# Patient Record
Sex: Female | Born: 1955
Health system: Southern US, Community
[De-identification: ages and names within clinical notes are randomized; demographics above are authoritative.]

## PROBLEM LIST (undated history)

## (undated) DIAGNOSIS — F419 Anxiety disorder, unspecified: Secondary | ICD-10-CM

## (undated) DIAGNOSIS — I509 Heart failure, unspecified: Secondary | ICD-10-CM

## (undated) DIAGNOSIS — J189 Pneumonia, unspecified organism: Secondary | ICD-10-CM

## (undated) DIAGNOSIS — R55 Syncope and collapse: Secondary | ICD-10-CM

## (undated) DIAGNOSIS — I219 Acute myocardial infarction, unspecified: Secondary | ICD-10-CM

## (undated) DIAGNOSIS — K219 Gastro-esophageal reflux disease without esophagitis: Secondary | ICD-10-CM

## (undated) DIAGNOSIS — T884XXA Failed or difficult intubation, initial encounter: Secondary | ICD-10-CM

## (undated) DIAGNOSIS — I447 Left bundle-branch block, unspecified: Secondary | ICD-10-CM

## (undated) DIAGNOSIS — I2510 Atherosclerotic heart disease of native coronary artery without angina pectoris: Secondary | ICD-10-CM

## (undated) DIAGNOSIS — K5790 Diverticulosis of intestine, part unspecified, without perforation or abscess without bleeding: Secondary | ICD-10-CM

## (undated) DIAGNOSIS — G43909 Migraine, unspecified, not intractable, without status migrainosus: Secondary | ICD-10-CM

## (undated) DIAGNOSIS — E222 Syndrome of inappropriate secretion of antidiuretic hormone: Secondary | ICD-10-CM

## (undated) DIAGNOSIS — I25119 Atherosclerotic heart disease of native coronary artery with unspecified angina pectoris: Secondary | ICD-10-CM

## (undated) DIAGNOSIS — F32A Depression, unspecified: Secondary | ICD-10-CM

## (undated) DIAGNOSIS — I2489 Other forms of acute ischemic heart disease: Secondary | ICD-10-CM

## (undated) DIAGNOSIS — R0602 Shortness of breath: Secondary | ICD-10-CM

## (undated) DIAGNOSIS — I248 Other forms of acute ischemic heart disease: Secondary | ICD-10-CM

## (undated) DIAGNOSIS — Z79899 Other long term (current) drug therapy: Secondary | ICD-10-CM

## (undated) DIAGNOSIS — I10 Essential (primary) hypertension: Secondary | ICD-10-CM

## (undated) DIAGNOSIS — M1990 Unspecified osteoarthritis, unspecified site: Secondary | ICD-10-CM

## (undated) DIAGNOSIS — I441 Atrioventricular block, second degree: Secondary | ICD-10-CM

## (undated) DIAGNOSIS — I4892 Unspecified atrial flutter: Secondary | ICD-10-CM

## (undated) DIAGNOSIS — J449 Chronic obstructive pulmonary disease, unspecified: Secondary | ICD-10-CM

## (undated) DIAGNOSIS — E785 Hyperlipidemia, unspecified: Secondary | ICD-10-CM

## (undated) DIAGNOSIS — F329 Major depressive disorder, single episode, unspecified: Secondary | ICD-10-CM

## (undated) DIAGNOSIS — J810 Acute pulmonary edema: Secondary | ICD-10-CM

## (undated) HISTORY — PX: DILATION AND CURETTAGE OF UTERUS: SHX78

## (undated) HISTORY — DX: Other long term (current) drug therapy: Z79.899

## (undated) HISTORY — PX: VAGINAL HYSTERECTOMY: SUR661

## (undated) HISTORY — PX: TUBAL LIGATION: SHX77

## (undated) HISTORY — DX: Migraine, unspecified, not intractable, without status migrainosus: G43.909

## (undated) HISTORY — DX: Unspecified osteoarthritis, unspecified site: M19.90

## (undated) HISTORY — DX: Diverticulosis of intestine, part unspecified, without perforation or abscess without bleeding: K57.90

## (undated) HISTORY — DX: Atrioventricular block, second degree: I44.1

## (undated) HISTORY — PX: BREAST SURGERY: SHX581

## (undated) HISTORY — DX: Pneumonia, unspecified organism: J18.9

## (undated) HISTORY — DX: Atherosclerotic heart disease of native coronary artery without angina pectoris: I25.10

## (undated) HISTORY — DX: Essential (primary) hypertension: I10

## (undated) HISTORY — PX: CARPAL TUNNEL RELEASE: SHX101

## (undated) HISTORY — DX: Other forms of acute ischemic heart disease: I24.89

## (undated) HISTORY — DX: Hyperlipidemia, unspecified: E78.5

## (undated) HISTORY — DX: Syndrome of inappropriate secretion of antidiuretic hormone: E22.2

## (undated) HISTORY — DX: Atherosclerotic heart disease of native coronary artery with unspecified angina pectoris: I25.119

## (undated) HISTORY — PX: SHOULDER ARTHROSCOPY WITH ROTATOR CUFF REPAIR: SHX5685

## (undated) HISTORY — PX: INGUINAL HERNIA REPAIR: SUR1180

## (undated) HISTORY — DX: Shortness of breath: R06.02

## (undated) HISTORY — DX: Acute pulmonary edema: J81.0

## (undated) HISTORY — DX: Syncope and collapse: R55

## (undated) HISTORY — DX: Other forms of acute ischemic heart disease: I24.8

## (undated) HISTORY — DX: Unspecified atrial flutter: I48.92

---

## 2004-10-20 HISTORY — PX: TRACHEOSTOMY: SUR1362

## 2006-10-20 HISTORY — PX: CARDIAC CATHETERIZATION: SHX172

## 2015-06-28 DIAGNOSIS — I447 Left bundle-branch block, unspecified: Secondary | ICD-10-CM

## 2015-06-28 DIAGNOSIS — I472 Ventricular tachycardia, unspecified: Secondary | ICD-10-CM

## 2015-06-28 DIAGNOSIS — Z8679 Personal history of other diseases of the circulatory system: Secondary | ICD-10-CM

## 2015-06-28 DIAGNOSIS — E782 Mixed hyperlipidemia: Secondary | ICD-10-CM

## 2015-06-28 DIAGNOSIS — I2510 Atherosclerotic heart disease of native coronary artery without angina pectoris: Secondary | ICD-10-CM

## 2015-06-28 HISTORY — DX: Mixed hyperlipidemia: E78.2

## 2015-06-28 HISTORY — DX: Left bundle-branch block, unspecified: I44.7

## 2015-06-28 HISTORY — DX: Atherosclerotic heart disease of native coronary artery without angina pectoris: I25.10

## 2015-06-28 HISTORY — DX: Ventricular tachycardia, unspecified: I47.20

## 2015-06-28 HISTORY — DX: Ventricular tachycardia: I47.2

## 2015-06-28 HISTORY — DX: Personal history of other diseases of the circulatory system: Z86.79

## 2015-11-12 DIAGNOSIS — G43909 Migraine, unspecified, not intractable, without status migrainosus: Secondary | ICD-10-CM | POA: Diagnosis not present

## 2015-11-12 DIAGNOSIS — E222 Syndrome of inappropriate secretion of antidiuretic hormone: Secondary | ICD-10-CM | POA: Diagnosis not present

## 2015-11-12 DIAGNOSIS — E785 Hyperlipidemia, unspecified: Secondary | ICD-10-CM | POA: Diagnosis not present

## 2015-11-12 DIAGNOSIS — I4892 Unspecified atrial flutter: Secondary | ICD-10-CM | POA: Diagnosis not present

## 2015-11-12 DIAGNOSIS — M109 Gout, unspecified: Secondary | ICD-10-CM | POA: Diagnosis not present

## 2015-11-12 DIAGNOSIS — F341 Dysthymic disorder: Secondary | ICD-10-CM | POA: Diagnosis not present

## 2015-11-12 DIAGNOSIS — I10 Essential (primary) hypertension: Secondary | ICD-10-CM | POA: Diagnosis not present

## 2015-11-12 DIAGNOSIS — I25119 Atherosclerotic heart disease of native coronary artery with unspecified angina pectoris: Secondary | ICD-10-CM | POA: Diagnosis not present

## 2015-11-12 DIAGNOSIS — J309 Allergic rhinitis, unspecified: Secondary | ICD-10-CM | POA: Diagnosis not present

## 2015-11-12 DIAGNOSIS — R5383 Other fatigue: Secondary | ICD-10-CM | POA: Diagnosis not present

## 2015-11-12 DIAGNOSIS — F419 Anxiety disorder, unspecified: Secondary | ICD-10-CM | POA: Diagnosis not present

## 2016-01-15 DIAGNOSIS — Z1231 Encounter for screening mammogram for malignant neoplasm of breast: Secondary | ICD-10-CM | POA: Diagnosis not present

## 2016-01-24 DIAGNOSIS — I447 Left bundle-branch block, unspecified: Secondary | ICD-10-CM | POA: Diagnosis not present

## 2016-01-24 DIAGNOSIS — I25118 Atherosclerotic heart disease of native coronary artery with other forms of angina pectoris: Secondary | ICD-10-CM | POA: Diagnosis not present

## 2016-01-24 DIAGNOSIS — E785 Hyperlipidemia, unspecified: Secondary | ICD-10-CM | POA: Diagnosis not present

## 2016-01-24 DIAGNOSIS — I4892 Unspecified atrial flutter: Secondary | ICD-10-CM | POA: Diagnosis not present

## 2016-01-24 DIAGNOSIS — I10 Essential (primary) hypertension: Secondary | ICD-10-CM | POA: Diagnosis not present

## 2016-02-04 DIAGNOSIS — N6011 Diffuse cystic mastopathy of right breast: Secondary | ICD-10-CM

## 2016-02-04 DIAGNOSIS — N6012 Diffuse cystic mastopathy of left breast: Secondary | ICD-10-CM | POA: Diagnosis not present

## 2016-02-04 HISTORY — DX: Diffuse cystic mastopathy of right breast: N60.11

## 2016-05-12 DIAGNOSIS — M150 Primary generalized (osteo)arthritis: Secondary | ICD-10-CM

## 2016-05-12 DIAGNOSIS — F419 Anxiety disorder, unspecified: Secondary | ICD-10-CM | POA: Insufficient documentation

## 2016-05-12 DIAGNOSIS — K4090 Unilateral inguinal hernia, without obstruction or gangrene, not specified as recurrent: Secondary | ICD-10-CM | POA: Insufficient documentation

## 2016-05-12 DIAGNOSIS — R5383 Other fatigue: Secondary | ICD-10-CM

## 2016-05-12 DIAGNOSIS — M159 Polyosteoarthritis, unspecified: Secondary | ICD-10-CM

## 2016-05-12 DIAGNOSIS — M109 Gout, unspecified: Secondary | ICD-10-CM

## 2016-05-12 DIAGNOSIS — E222 Syndrome of inappropriate secretion of antidiuretic hormone: Secondary | ICD-10-CM

## 2016-05-12 DIAGNOSIS — Z79899 Other long term (current) drug therapy: Secondary | ICD-10-CM

## 2016-05-12 DIAGNOSIS — R5381 Other malaise: Secondary | ICD-10-CM | POA: Insufficient documentation

## 2016-05-12 HISTORY — DX: Polyosteoarthritis, unspecified: M15.9

## 2016-05-12 HISTORY — DX: Gout, unspecified: M10.9

## 2016-05-12 HISTORY — DX: Other fatigue: R53.83

## 2016-05-12 HISTORY — DX: Primary generalized (osteo)arthritis: M15.0

## 2016-05-12 HISTORY — DX: Syndrome of inappropriate secretion of antidiuretic hormone: E22.2

## 2016-05-12 HISTORY — DX: Unilateral inguinal hernia, without obstruction or gangrene, not specified as recurrent: K40.90

## 2016-05-12 HISTORY — DX: Other long term (current) drug therapy: Z79.899

## 2016-05-12 HISTORY — DX: Other malaise: R53.81

## 2016-05-21 DIAGNOSIS — I472 Ventricular tachycardia: Secondary | ICD-10-CM | POA: Diagnosis not present

## 2016-05-21 DIAGNOSIS — E782 Mixed hyperlipidemia: Secondary | ICD-10-CM | POA: Diagnosis not present

## 2016-05-21 DIAGNOSIS — R5381 Other malaise: Secondary | ICD-10-CM | POA: Diagnosis not present

## 2016-05-21 DIAGNOSIS — M1A09X0 Idiopathic chronic gout, multiple sites, without tophus (tophi): Secondary | ICD-10-CM | POA: Diagnosis not present

## 2016-05-21 DIAGNOSIS — N6011 Diffuse cystic mastopathy of right breast: Secondary | ICD-10-CM | POA: Diagnosis not present

## 2016-05-21 DIAGNOSIS — E222 Syndrome of inappropriate secretion of antidiuretic hormone: Secondary | ICD-10-CM | POA: Diagnosis not present

## 2016-05-21 DIAGNOSIS — I10 Essential (primary) hypertension: Secondary | ICD-10-CM | POA: Diagnosis not present

## 2016-05-21 DIAGNOSIS — F419 Anxiety disorder, unspecified: Secondary | ICD-10-CM | POA: Diagnosis not present

## 2016-05-21 DIAGNOSIS — F341 Dysthymic disorder: Secondary | ICD-10-CM | POA: Diagnosis not present

## 2016-05-21 DIAGNOSIS — M150 Primary generalized (osteo)arthritis: Secondary | ICD-10-CM | POA: Diagnosis not present

## 2016-05-21 DIAGNOSIS — R5383 Other fatigue: Secondary | ICD-10-CM | POA: Diagnosis not present

## 2016-05-21 DIAGNOSIS — I2510 Atherosclerotic heart disease of native coronary artery without angina pectoris: Secondary | ICD-10-CM | POA: Diagnosis not present

## 2016-05-21 DIAGNOSIS — I4892 Unspecified atrial flutter: Secondary | ICD-10-CM | POA: Diagnosis not present

## 2016-08-04 DIAGNOSIS — Z23 Encounter for immunization: Secondary | ICD-10-CM | POA: Diagnosis not present

## 2016-08-29 DIAGNOSIS — I483 Typical atrial flutter: Secondary | ICD-10-CM | POA: Diagnosis not present

## 2016-09-05 DIAGNOSIS — I4892 Unspecified atrial flutter: Secondary | ICD-10-CM | POA: Diagnosis not present

## 2016-09-08 DIAGNOSIS — I4892 Unspecified atrial flutter: Secondary | ICD-10-CM | POA: Diagnosis not present

## 2016-09-16 DIAGNOSIS — I10 Essential (primary) hypertension: Secondary | ICD-10-CM | POA: Diagnosis not present

## 2016-09-16 DIAGNOSIS — J209 Acute bronchitis, unspecified: Secondary | ICD-10-CM | POA: Diagnosis not present

## 2016-09-16 DIAGNOSIS — I4892 Unspecified atrial flutter: Secondary | ICD-10-CM | POA: Diagnosis not present

## 2016-09-17 DIAGNOSIS — J9601 Acute respiratory failure with hypoxia: Secondary | ICD-10-CM | POA: Diagnosis not present

## 2016-09-17 DIAGNOSIS — I252 Old myocardial infarction: Secondary | ICD-10-CM | POA: Diagnosis not present

## 2016-09-17 DIAGNOSIS — Z7902 Long term (current) use of antithrombotics/antiplatelets: Secondary | ICD-10-CM | POA: Diagnosis not present

## 2016-09-17 DIAGNOSIS — J209 Acute bronchitis, unspecified: Secondary | ICD-10-CM | POA: Diagnosis not present

## 2016-09-17 DIAGNOSIS — I25119 Atherosclerotic heart disease of native coronary artery with unspecified angina pectoris: Secondary | ICD-10-CM | POA: Diagnosis not present

## 2016-09-17 DIAGNOSIS — M1990 Unspecified osteoarthritis, unspecified site: Secondary | ICD-10-CM | POA: Diagnosis not present

## 2016-09-17 DIAGNOSIS — F418 Other specified anxiety disorders: Secondary | ICD-10-CM | POA: Diagnosis not present

## 2016-09-17 DIAGNOSIS — I248 Other forms of acute ischemic heart disease: Secondary | ICD-10-CM | POA: Diagnosis not present

## 2016-09-17 DIAGNOSIS — I5031 Acute diastolic (congestive) heart failure: Secondary | ICD-10-CM | POA: Diagnosis not present

## 2016-09-17 DIAGNOSIS — R748 Abnormal levels of other serum enzymes: Secondary | ICD-10-CM | POA: Diagnosis not present

## 2016-09-17 DIAGNOSIS — F17210 Nicotine dependence, cigarettes, uncomplicated: Secondary | ICD-10-CM | POA: Diagnosis not present

## 2016-09-17 DIAGNOSIS — Z885 Allergy status to narcotic agent status: Secondary | ICD-10-CM | POA: Diagnosis not present

## 2016-09-17 DIAGNOSIS — I2510 Atherosclerotic heart disease of native coronary artery without angina pectoris: Secondary | ICD-10-CM | POA: Diagnosis not present

## 2016-09-17 DIAGNOSIS — R0602 Shortness of breath: Secondary | ICD-10-CM | POA: Diagnosis not present

## 2016-09-17 DIAGNOSIS — J440 Chronic obstructive pulmonary disease with acute lower respiratory infection: Secondary | ICD-10-CM | POA: Diagnosis not present

## 2016-09-17 DIAGNOSIS — R911 Solitary pulmonary nodule: Secondary | ICD-10-CM | POA: Diagnosis not present

## 2016-09-17 DIAGNOSIS — E785 Hyperlipidemia, unspecified: Secondary | ICD-10-CM | POA: Diagnosis not present

## 2016-09-17 DIAGNOSIS — R0600 Dyspnea, unspecified: Secondary | ICD-10-CM | POA: Diagnosis not present

## 2016-09-17 DIAGNOSIS — J441 Chronic obstructive pulmonary disease with (acute) exacerbation: Secondary | ICD-10-CM | POA: Diagnosis not present

## 2016-09-17 DIAGNOSIS — E784 Other hyperlipidemia: Secondary | ICD-10-CM | POA: Diagnosis not present

## 2016-09-17 DIAGNOSIS — Z8249 Family history of ischemic heart disease and other diseases of the circulatory system: Secondary | ICD-10-CM | POA: Diagnosis not present

## 2016-09-17 DIAGNOSIS — E871 Hypo-osmolality and hyponatremia: Secondary | ICD-10-CM | POA: Diagnosis not present

## 2016-09-17 DIAGNOSIS — R069 Unspecified abnormalities of breathing: Secondary | ICD-10-CM | POA: Diagnosis not present

## 2016-09-17 DIAGNOSIS — R9431 Abnormal electrocardiogram [ECG] [EKG]: Secondary | ICD-10-CM | POA: Diagnosis not present

## 2016-09-17 DIAGNOSIS — I4892 Unspecified atrial flutter: Secondary | ICD-10-CM | POA: Diagnosis not present

## 2016-09-17 DIAGNOSIS — Z79899 Other long term (current) drug therapy: Secondary | ICD-10-CM | POA: Diagnosis not present

## 2016-09-17 DIAGNOSIS — I119 Hypertensive heart disease without heart failure: Secondary | ICD-10-CM | POA: Diagnosis not present

## 2016-09-17 DIAGNOSIS — I447 Left bundle-branch block, unspecified: Secondary | ICD-10-CM | POA: Diagnosis not present

## 2016-09-18 DIAGNOSIS — I248 Other forms of acute ischemic heart disease: Secondary | ICD-10-CM | POA: Diagnosis not present

## 2016-09-18 DIAGNOSIS — J441 Chronic obstructive pulmonary disease with (acute) exacerbation: Secondary | ICD-10-CM | POA: Diagnosis not present

## 2016-09-18 DIAGNOSIS — I25118 Atherosclerotic heart disease of native coronary artery with other forms of angina pectoris: Secondary | ICD-10-CM | POA: Diagnosis not present

## 2016-09-18 DIAGNOSIS — I2510 Atherosclerotic heart disease of native coronary artery without angina pectoris: Secondary | ICD-10-CM | POA: Diagnosis not present

## 2016-09-18 DIAGNOSIS — I5031 Acute diastolic (congestive) heart failure: Secondary | ICD-10-CM | POA: Diagnosis not present

## 2016-09-18 DIAGNOSIS — J449 Chronic obstructive pulmonary disease, unspecified: Secondary | ICD-10-CM | POA: Diagnosis not present

## 2016-09-18 DIAGNOSIS — I119 Hypertensive heart disease without heart failure: Secondary | ICD-10-CM | POA: Diagnosis not present

## 2016-09-18 DIAGNOSIS — I4892 Unspecified atrial flutter: Secondary | ICD-10-CM | POA: Diagnosis not present

## 2016-09-18 DIAGNOSIS — E784 Other hyperlipidemia: Secondary | ICD-10-CM | POA: Diagnosis not present

## 2016-09-19 DIAGNOSIS — J449 Chronic obstructive pulmonary disease, unspecified: Secondary | ICD-10-CM | POA: Diagnosis not present

## 2016-09-19 DIAGNOSIS — I4892 Unspecified atrial flutter: Secondary | ICD-10-CM | POA: Diagnosis not present

## 2016-09-19 DIAGNOSIS — I5031 Acute diastolic (congestive) heart failure: Secondary | ICD-10-CM | POA: Diagnosis not present

## 2016-09-19 DIAGNOSIS — I25118 Atherosclerotic heart disease of native coronary artery with other forms of angina pectoris: Secondary | ICD-10-CM | POA: Diagnosis not present

## 2016-09-19 DIAGNOSIS — I248 Other forms of acute ischemic heart disease: Secondary | ICD-10-CM | POA: Diagnosis not present

## 2016-09-19 DIAGNOSIS — I119 Hypertensive heart disease without heart failure: Secondary | ICD-10-CM | POA: Diagnosis not present

## 2016-09-19 DIAGNOSIS — I509 Heart failure, unspecified: Secondary | ICD-10-CM | POA: Diagnosis not present

## 2016-09-19 DIAGNOSIS — E784 Other hyperlipidemia: Secondary | ICD-10-CM | POA: Diagnosis not present

## 2016-09-20 DIAGNOSIS — I4892 Unspecified atrial flutter: Secondary | ICD-10-CM | POA: Diagnosis not present

## 2016-09-20 DIAGNOSIS — I119 Hypertensive heart disease without heart failure: Secondary | ICD-10-CM | POA: Diagnosis not present

## 2016-09-20 DIAGNOSIS — I248 Other forms of acute ischemic heart disease: Secondary | ICD-10-CM | POA: Diagnosis not present

## 2016-09-20 DIAGNOSIS — I5031 Acute diastolic (congestive) heart failure: Secondary | ICD-10-CM | POA: Diagnosis not present

## 2016-09-20 DIAGNOSIS — I25118 Atherosclerotic heart disease of native coronary artery with other forms of angina pectoris: Secondary | ICD-10-CM | POA: Diagnosis not present

## 2016-09-20 DIAGNOSIS — I4581 Long QT syndrome: Secondary | ICD-10-CM | POA: Diagnosis not present

## 2016-09-21 DIAGNOSIS — I4892 Unspecified atrial flutter: Secondary | ICD-10-CM | POA: Diagnosis not present

## 2016-09-21 DIAGNOSIS — I4581 Long QT syndrome: Secondary | ICD-10-CM | POA: Diagnosis not present

## 2016-09-21 DIAGNOSIS — J441 Chronic obstructive pulmonary disease with (acute) exacerbation: Secondary | ICD-10-CM | POA: Diagnosis not present

## 2016-09-21 DIAGNOSIS — R0602 Shortness of breath: Secondary | ICD-10-CM | POA: Diagnosis not present

## 2016-09-21 DIAGNOSIS — I25118 Atherosclerotic heart disease of native coronary artery with other forms of angina pectoris: Secondary | ICD-10-CM | POA: Diagnosis not present

## 2016-09-21 DIAGNOSIS — I248 Other forms of acute ischemic heart disease: Secondary | ICD-10-CM | POA: Diagnosis not present

## 2016-09-21 DIAGNOSIS — I119 Hypertensive heart disease without heart failure: Secondary | ICD-10-CM | POA: Diagnosis not present

## 2016-09-21 DIAGNOSIS — I5031 Acute diastolic (congestive) heart failure: Secondary | ICD-10-CM | POA: Diagnosis not present

## 2016-09-21 DIAGNOSIS — I361 Nonrheumatic tricuspid (valve) insufficiency: Secondary | ICD-10-CM | POA: Diagnosis not present

## 2016-09-22 DIAGNOSIS — R0603 Acute respiratory distress: Secondary | ICD-10-CM | POA: Diagnosis not present

## 2016-09-26 DIAGNOSIS — R5381 Other malaise: Secondary | ICD-10-CM | POA: Diagnosis not present

## 2016-09-26 DIAGNOSIS — I4892 Unspecified atrial flutter: Secondary | ICD-10-CM | POA: Diagnosis not present

## 2016-09-26 DIAGNOSIS — I10 Essential (primary) hypertension: Secondary | ICD-10-CM | POA: Diagnosis not present

## 2016-09-26 DIAGNOSIS — F330 Major depressive disorder, recurrent, mild: Secondary | ICD-10-CM | POA: Diagnosis not present

## 2016-09-26 DIAGNOSIS — I509 Heart failure, unspecified: Secondary | ICD-10-CM | POA: Diagnosis not present

## 2016-09-26 DIAGNOSIS — I2510 Atherosclerotic heart disease of native coronary artery without angina pectoris: Secondary | ICD-10-CM | POA: Diagnosis not present

## 2016-09-26 DIAGNOSIS — F419 Anxiety disorder, unspecified: Secondary | ICD-10-CM | POA: Diagnosis not present

## 2016-09-26 DIAGNOSIS — F17210 Nicotine dependence, cigarettes, uncomplicated: Secondary | ICD-10-CM | POA: Diagnosis not present

## 2016-09-26 DIAGNOSIS — E782 Mixed hyperlipidemia: Secondary | ICD-10-CM | POA: Diagnosis not present

## 2016-09-26 DIAGNOSIS — R9431 Abnormal electrocardiogram [ECG] [EKG]: Secondary | ICD-10-CM | POA: Diagnosis not present

## 2016-09-26 DIAGNOSIS — Z79899 Other long term (current) drug therapy: Secondary | ICD-10-CM | POA: Diagnosis not present

## 2016-09-26 DIAGNOSIS — E222 Syndrome of inappropriate secretion of antidiuretic hormone: Secondary | ICD-10-CM | POA: Diagnosis not present

## 2016-09-27 DIAGNOSIS — R9431 Abnormal electrocardiogram [ECG] [EKG]: Secondary | ICD-10-CM | POA: Insufficient documentation

## 2016-09-27 DIAGNOSIS — F331 Major depressive disorder, recurrent, moderate: Secondary | ICD-10-CM | POA: Insufficient documentation

## 2016-09-27 DIAGNOSIS — F330 Major depressive disorder, recurrent, mild: Secondary | ICD-10-CM

## 2016-09-27 HISTORY — DX: Major depressive disorder, recurrent, mild: F33.0

## 2016-09-27 HISTORY — DX: Major depressive disorder, recurrent, moderate: F33.1

## 2016-10-10 DIAGNOSIS — R9431 Abnormal electrocardiogram [ECG] [EKG]: Secondary | ICD-10-CM | POA: Diagnosis not present

## 2016-10-10 DIAGNOSIS — E222 Syndrome of inappropriate secretion of antidiuretic hormone: Secondary | ICD-10-CM | POA: Diagnosis not present

## 2016-10-10 DIAGNOSIS — F17210 Nicotine dependence, cigarettes, uncomplicated: Secondary | ICD-10-CM | POA: Diagnosis not present

## 2016-10-10 DIAGNOSIS — J449 Chronic obstructive pulmonary disease, unspecified: Secondary | ICD-10-CM | POA: Insufficient documentation

## 2016-10-10 DIAGNOSIS — F330 Major depressive disorder, recurrent, mild: Secondary | ICD-10-CM | POA: Diagnosis not present

## 2016-10-10 HISTORY — DX: Chronic obstructive pulmonary disease, unspecified: J44.9

## 2016-10-16 DIAGNOSIS — I248 Other forms of acute ischemic heart disease: Secondary | ICD-10-CM | POA: Diagnosis not present

## 2016-10-16 DIAGNOSIS — I4892 Unspecified atrial flutter: Secondary | ICD-10-CM | POA: Diagnosis not present

## 2016-10-16 DIAGNOSIS — F329 Major depressive disorder, single episode, unspecified: Secondary | ICD-10-CM | POA: Diagnosis not present

## 2016-10-16 DIAGNOSIS — I472 Ventricular tachycardia: Secondary | ICD-10-CM | POA: Diagnosis not present

## 2016-10-16 DIAGNOSIS — I2510 Atherosclerotic heart disease of native coronary artery without angina pectoris: Secondary | ICD-10-CM | POA: Diagnosis not present

## 2016-11-12 DIAGNOSIS — J449 Chronic obstructive pulmonary disease, unspecified: Secondary | ICD-10-CM | POA: Diagnosis not present

## 2016-11-12 DIAGNOSIS — R9431 Abnormal electrocardiogram [ECG] [EKG]: Secondary | ICD-10-CM | POA: Diagnosis not present

## 2016-11-12 DIAGNOSIS — J40 Bronchitis, not specified as acute or chronic: Secondary | ICD-10-CM | POA: Diagnosis not present

## 2016-11-12 DIAGNOSIS — J069 Acute upper respiratory infection, unspecified: Secondary | ICD-10-CM | POA: Diagnosis not present

## 2016-11-12 DIAGNOSIS — F330 Major depressive disorder, recurrent, mild: Secondary | ICD-10-CM | POA: Diagnosis not present

## 2016-11-27 DIAGNOSIS — E785 Hyperlipidemia, unspecified: Secondary | ICD-10-CM | POA: Diagnosis not present

## 2016-11-27 DIAGNOSIS — I2510 Atherosclerotic heart disease of native coronary artery without angina pectoris: Secondary | ICD-10-CM | POA: Diagnosis not present

## 2016-11-27 DIAGNOSIS — I110 Hypertensive heart disease with heart failure: Secondary | ICD-10-CM | POA: Diagnosis not present

## 2016-11-27 DIAGNOSIS — I447 Left bundle-branch block, unspecified: Secondary | ICD-10-CM | POA: Diagnosis not present

## 2016-11-27 DIAGNOSIS — I248 Other forms of acute ischemic heart disease: Secondary | ICD-10-CM | POA: Diagnosis not present

## 2017-01-24 DIAGNOSIS — I480 Paroxysmal atrial fibrillation: Secondary | ICD-10-CM | POA: Diagnosis not present

## 2017-01-24 DIAGNOSIS — R002 Palpitations: Secondary | ICD-10-CM | POA: Diagnosis not present

## 2017-01-24 DIAGNOSIS — I4891 Unspecified atrial fibrillation: Secondary | ICD-10-CM | POA: Diagnosis not present

## 2017-01-25 DIAGNOSIS — I10 Essential (primary) hypertension: Secondary | ICD-10-CM | POA: Diagnosis not present

## 2017-01-25 DIAGNOSIS — Z7952 Long term (current) use of systemic steroids: Secondary | ICD-10-CM | POA: Diagnosis not present

## 2017-01-25 DIAGNOSIS — I447 Left bundle-branch block, unspecified: Secondary | ICD-10-CM | POA: Diagnosis not present

## 2017-01-25 DIAGNOSIS — I2510 Atherosclerotic heart disease of native coronary artery without angina pectoris: Secondary | ICD-10-CM | POA: Diagnosis not present

## 2017-01-25 DIAGNOSIS — Z79899 Other long term (current) drug therapy: Secondary | ICD-10-CM | POA: Diagnosis not present

## 2017-01-25 DIAGNOSIS — Z7902 Long term (current) use of antithrombotics/antiplatelets: Secondary | ICD-10-CM | POA: Diagnosis not present

## 2017-01-25 DIAGNOSIS — J449 Chronic obstructive pulmonary disease, unspecified: Secondary | ICD-10-CM | POA: Diagnosis not present

## 2017-01-25 DIAGNOSIS — E785 Hyperlipidemia, unspecified: Secondary | ICD-10-CM | POA: Diagnosis not present

## 2017-01-25 DIAGNOSIS — I481 Persistent atrial fibrillation: Secondary | ICD-10-CM | POA: Diagnosis not present

## 2017-01-25 DIAGNOSIS — I4892 Unspecified atrial flutter: Secondary | ICD-10-CM | POA: Diagnosis not present

## 2017-01-25 DIAGNOSIS — R002 Palpitations: Secondary | ICD-10-CM | POA: Diagnosis not present

## 2017-01-25 DIAGNOSIS — Z7901 Long term (current) use of anticoagulants: Secondary | ICD-10-CM | POA: Diagnosis not present

## 2017-01-25 DIAGNOSIS — I4891 Unspecified atrial fibrillation: Secondary | ICD-10-CM | POA: Diagnosis not present

## 2017-01-25 DIAGNOSIS — I480 Paroxysmal atrial fibrillation: Secondary | ICD-10-CM | POA: Diagnosis not present

## 2017-01-25 DIAGNOSIS — F17210 Nicotine dependence, cigarettes, uncomplicated: Secondary | ICD-10-CM | POA: Diagnosis not present

## 2017-01-26 DIAGNOSIS — R0789 Other chest pain: Secondary | ICD-10-CM | POA: Diagnosis not present

## 2017-01-26 DIAGNOSIS — I480 Paroxysmal atrial fibrillation: Secondary | ICD-10-CM | POA: Diagnosis not present

## 2017-01-26 DIAGNOSIS — I4891 Unspecified atrial fibrillation: Secondary | ICD-10-CM | POA: Diagnosis not present

## 2017-01-26 DIAGNOSIS — E784 Other hyperlipidemia: Secondary | ICD-10-CM | POA: Diagnosis not present

## 2017-01-26 DIAGNOSIS — Z7901 Long term (current) use of anticoagulants: Secondary | ICD-10-CM | POA: Diagnosis not present

## 2017-01-26 DIAGNOSIS — F17210 Nicotine dependence, cigarettes, uncomplicated: Secondary | ICD-10-CM | POA: Diagnosis not present

## 2017-01-26 DIAGNOSIS — I119 Hypertensive heart disease without heart failure: Secondary | ICD-10-CM | POA: Diagnosis not present

## 2017-02-05 DIAGNOSIS — R5381 Other malaise: Secondary | ICD-10-CM | POA: Diagnosis not present

## 2017-02-05 DIAGNOSIS — I2510 Atherosclerotic heart disease of native coronary artery without angina pectoris: Secondary | ICD-10-CM | POA: Diagnosis not present

## 2017-02-05 DIAGNOSIS — J449 Chronic obstructive pulmonary disease, unspecified: Secondary | ICD-10-CM | POA: Diagnosis not present

## 2017-02-05 DIAGNOSIS — I10 Essential (primary) hypertension: Secondary | ICD-10-CM | POA: Diagnosis not present

## 2017-02-05 DIAGNOSIS — I482 Chronic atrial fibrillation: Secondary | ICD-10-CM | POA: Diagnosis not present

## 2017-02-05 DIAGNOSIS — Z79899 Other long term (current) drug therapy: Secondary | ICD-10-CM | POA: Diagnosis not present

## 2017-02-05 DIAGNOSIS — E222 Syndrome of inappropriate secretion of antidiuretic hormone: Secondary | ICD-10-CM | POA: Diagnosis not present

## 2017-02-05 DIAGNOSIS — F330 Major depressive disorder, recurrent, mild: Secondary | ICD-10-CM | POA: Diagnosis not present

## 2017-02-05 DIAGNOSIS — R5383 Other fatigue: Secondary | ICD-10-CM | POA: Diagnosis not present

## 2017-02-11 DIAGNOSIS — Z1231 Encounter for screening mammogram for malignant neoplasm of breast: Secondary | ICD-10-CM | POA: Diagnosis not present

## 2017-02-23 DIAGNOSIS — N6011 Diffuse cystic mastopathy of right breast: Secondary | ICD-10-CM | POA: Diagnosis not present

## 2017-02-23 DIAGNOSIS — N6012 Diffuse cystic mastopathy of left breast: Secondary | ICD-10-CM | POA: Diagnosis not present

## 2017-02-26 DIAGNOSIS — I2510 Atherosclerotic heart disease of native coronary artery without angina pectoris: Secondary | ICD-10-CM | POA: Diagnosis not present

## 2017-02-26 DIAGNOSIS — I110 Hypertensive heart disease with heart failure: Secondary | ICD-10-CM | POA: Diagnosis not present

## 2017-02-26 DIAGNOSIS — E785 Hyperlipidemia, unspecified: Secondary | ICD-10-CM | POA: Diagnosis not present

## 2017-02-26 DIAGNOSIS — Z79899 Other long term (current) drug therapy: Secondary | ICD-10-CM | POA: Diagnosis not present

## 2017-02-26 DIAGNOSIS — I472 Ventricular tachycardia: Secondary | ICD-10-CM | POA: Diagnosis not present

## 2017-02-26 DIAGNOSIS — I4892 Unspecified atrial flutter: Secondary | ICD-10-CM | POA: Diagnosis not present

## 2017-03-18 ENCOUNTER — Encounter: Payer: Self-pay | Admitting: Cardiology

## 2017-03-25 ENCOUNTER — Encounter: Payer: Self-pay | Admitting: Cardiology

## 2017-03-25 ENCOUNTER — Ambulatory Visit (INDEPENDENT_AMBULATORY_CARE_PROVIDER_SITE_OTHER): Payer: PPO | Admitting: Cardiology

## 2017-03-25 ENCOUNTER — Encounter (INDEPENDENT_AMBULATORY_CARE_PROVIDER_SITE_OTHER): Payer: Self-pay

## 2017-03-25 VITALS — BP 152/91 | HR 56 | Ht 60.0 in | Wt 130.0 lb

## 2017-03-25 DIAGNOSIS — I5032 Chronic diastolic (congestive) heart failure: Secondary | ICD-10-CM

## 2017-03-25 DIAGNOSIS — I25118 Atherosclerotic heart disease of native coronary artery with other forms of angina pectoris: Secondary | ICD-10-CM

## 2017-03-25 DIAGNOSIS — I10 Essential (primary) hypertension: Secondary | ICD-10-CM

## 2017-03-25 DIAGNOSIS — I4892 Unspecified atrial flutter: Secondary | ICD-10-CM

## 2017-03-25 DIAGNOSIS — I110 Hypertensive heart disease with heart failure: Secondary | ICD-10-CM | POA: Diagnosis not present

## 2017-03-25 DIAGNOSIS — E785 Hyperlipidemia, unspecified: Secondary | ICD-10-CM | POA: Diagnosis not present

## 2017-03-25 DIAGNOSIS — I25119 Atherosclerotic heart disease of native coronary artery with unspecified angina pectoris: Secondary | ICD-10-CM

## 2017-03-25 HISTORY — DX: Hypertensive heart disease with heart failure: I11.0

## 2017-03-25 HISTORY — DX: Atherosclerotic heart disease of native coronary artery with unspecified angina pectoris: I25.119

## 2017-03-25 NOTE — Patient Instructions (Signed)
Medication Instructions:    Your physician recommends that you continue on your current medications as directed. Please refer to the Current Medication list given to you today.  - If you need a refill on your cardiac medications before your next appointment, please call your pharmacy.   Labwork:  None ordered  Testing/Procedures:  None ordered  Follow-Up:  Your physician recommends that you schedule a follow-up appointment in: 3 months with Dr. Camnitz.  Thank you for choosing [facility]!!   Adalynd Donahoe, RN [phone]         

## 2017-03-25 NOTE — Progress Notes (Signed)
Electrophysiology Office Note   Date:  03/25/2017   ID:  Jennifer Martin, DOB 1956-06-14, MRN [id number]  PCP:  [name]., MD  Cardiologist:  [name] Primary Electrophysiologist:  [name], MD    Chief Complaint  Patient presents with  . Advice Only    AFlutter     History of Present Illness: Jennifer Martin is a 61 y.o. female who is being seen today for the evaluation of atrial flutter at the request of [name]. Presenting today for electrophysiology evaluation. She has a history of coronary artery disease, hyperlipidemia, hypertension, and atrial flutter. She also has COPD with a demand ischemia and heart failure. She was admitted to [facility] in atrial fibrillation in April. She has no angina only mild shortness of breath, felt related to her COPD. She has not had syncope or bleeding complications of her anticoagulants. She was placed on amiodarone during her hospitalization in April. Since that time, she is only noted. Episodes of palpitations. She was diagnosed with atrial flutter many years ago.    Today, she denies symptoms of palpitations, chest pain, shortness of breath, orthopnea, PND, lower extremity edema, claudication, dizziness, presyncope, syncope, bleeding, or neurologic sequela. The patient is tolerating medications without difficulties.    Past Medical History:  Diagnosis Date  . Atherosclerosis of coronary artery of native heart with angina pectoris (HCC)   . Atrial flutter (HCC)   . CAD (coronary artery disease)   . Diverticulosis   . Hyperlipidemia   . Hypertension   . Inappropriate ADH syndrome (HCC)   . Inguinal hernia   . Migraine   . Osteoarthritis   . Pneumonia    Past Surgical History:  Procedure Laterality Date  . ABDOMINAL HYSTERECTOMY    . BREAST LUMPECTOMY    . SHOULDER SURGERY    . TRACHEOSTOMY       Current Outpatient Prescriptions  Medication Sig Dispense Refill  . albuterol (PROVENTIL HFA;VENTOLIN HFA) 108  (90 Base) MCG/ACT inhaler Inhale 2 puffs into the lungs every 6 (six) hours as needed for wheezing or shortness of breath.    Marland Kitchen amiodarone (PACERONE) 200 MG tablet Take 200 mg by mouth 2 (two) times daily.    Marland Kitchen atorvastatin (LIPITOR) 20 MG tablet Take 20 mg by mouth daily.    . budesonide-formoterol (SYMBICORT) 160-4.5 MCG/ACT inhaler Inhale 2 puffs into the lungs 2 (two) times daily.    . carvedilol (COREG) 12.5 MG tablet Take 12.5 mg by mouth 2 (two) times daily with a meal.    . clopidogrel (PLAVIX) 75 MG tablet Take 75 mg by mouth daily.    Marland Kitchen diltiazem (CARDIZEM CD) 120 MG 24 hr capsule Take 120 mg by mouth daily.    Marland Kitchen escitalopram (LEXAPRO) 10 MG tablet Take 5 mg by mouth daily.    . fluticasone (FLONASE) 50 MCG/ACT nasal spray Place 2 sprays into both nostrils daily.    . furosemide (LASIX) 20 MG tablet Take 20 mg by mouth daily as needed for fluid or edema. FOR WEIGHT 131 OR GREATER    . LORazepam (ATIVAN) 1 MG tablet Take 1 mg by mouth 2 (two) times daily as needed for anxiety.    . nitroGLYCERIN (NITROSTAT) 0.4 MG SL tablet Place 0.4 mg under the tongue every 5 (five) minutes as needed for chest pain.    . Omega-3 Fatty Acids (OMEGA-3 FISH OIL) 300 MG CAPS Take 2 g by mouth daily.    . pantoprazole (PROTONIX) 40 MG  tablet Take 40 mg by mouth daily.    . potassium chloride SA (K-DUR,KLOR-CON) 20 MEQ tablet Take 20 mEq by mouth daily as needed. IF YOU TAKE FUROSEMIDE    . rivaroxaban (XARELTO) 20 MG TABS tablet Take 20 mg by mouth daily.     No current facility-administered medications for this visit.     Allergies:   Cefdinir; Levaquin [levofloxacin in d5w]; and Morphine and related   Social History:  The patient  reports that she has been smoking.  She has never used smokeless tobacco. She reports that she does not drink alcohol or use drugs.   Family History:  The patient's family history includes CAD in her brother; Heart disease in her brother.    ROS:  Please see the history  of present illness.   Otherwise, review of systems is positive for none.   All other systems are reviewed and negative.    PHYSICAL EXAM: VS:  BP (!) 152/91 (BP Location: Right Arm)   Pulse (!) 56   Ht 5' (1.524 m)   Wt 130 lb (59 kg)   BMI 25.39 kg/m  , BMI Body mass index is 25.39 kg/m. GEN: Well nourished, well developed, in no acute distress  HEENT: normal  Neck: no JVD, carotid bruits, or masses Cardiac: RRR; no murmurs, rubs, or gallops,no edema  Respiratory:  clear to auscultation bilaterally, normal work of breathing GI: soft, nontender, nondistended, + BS MS: no deformity or atrophy  Skin: warm and dry Neuro:  Strength and sensation are intact Psych: euthymic mood, full affect  EKG:  EKG is ordered today. Personal review of the ekg ordered shows sinus rhythm, LBBB, 1 degree AV block  Recent Labs: No results found for requested labs within last 8760 hours.    Lipid Panel  No results found for: CHOL, TRIG, HDL, CHOLHDL, VLDL, LDLCALC, LDLDIRECT   Wt Readings from Last 3 Encounters:  03/25/17 130 lb (59 kg)      Other studies Reviewed: Additional studies/ records that were reviewed today include: Outside cardiology records  ASSESSMENT AND PLAN:  1.  Atrial fibrillation/flutter: Currently on amiodarone and Xarelto. She is maintaining sinus rhythm. Would prefer to have her off of amiodarone, as it does have long-term side effects. I offered her the option of therapy with Tikosyn or sotalol, but she does not wish to be admitted to the hospital for drug-eluting. She does say that she has coronary disease, which includes left main disease, and would therefore prefer not to have her on flecainide. I discussed with her the risks and benefits of ablation. Risks include bleeding, tamponade, heart block, stroke, and damage to surrounding organs. She understands these risks and would like to have the procedure performed. We Juancarlos Crescenzo work to get echo results, catheter results, and  previous EKGs to review prior to scheduling.  2. Hypertensive heart disease with heart failure: No evidence of volume overload. Currently on a diuretic. Blood pressure is elevated today, but within normal limits at home. No changes today.  3. Hyperlipidemia: Continue statin  4. Coronary artery disease with stable angina: no chest pain currently, continue current management with Plavix, CCB, BB, and statin.  Current medicines are reviewed at length with the patient today.   The patient does not have concerns regarding her medicines.  The following changes were made today:  none  Labs/ tests ordered today include:  Orders Placed This Encounter  Procedures  . EKG 12-Lead     Disposition:   FU with  Raji Glinski 3 months  Signed, Rafael Salway [name], MD  03/25/2017 3:22 PM     [facility] [address] [place] [address] [phone] (office) [phone] (fax)

## 2017-03-26 ENCOUNTER — Telehealth: Payer: Self-pay | Admitting: Cardiology

## 2017-03-26 NOTE — Telephone Encounter (Signed)
Records Rec from [facility]. Gave to [place]

## 2017-03-26 NOTE — Telephone Encounter (Signed)
ROI faxed to [facility] @ [phone]

## 2017-03-31 ENCOUNTER — Institutional Professional Consult (permissible substitution): Payer: PPO | Admitting: Cardiology

## 2017-04-07 ENCOUNTER — Ambulatory Visit: Payer: Self-pay | Admitting: Podiatry

## 2017-04-13 ENCOUNTER — Encounter: Payer: Self-pay | Admitting: Cardiology

## 2017-04-13 DIAGNOSIS — Z79899 Other long term (current) drug therapy: Secondary | ICD-10-CM | POA: Insufficient documentation

## 2017-04-13 DIAGNOSIS — Z7901 Long term (current) use of anticoagulants: Secondary | ICD-10-CM

## 2017-04-13 DIAGNOSIS — I5042 Chronic combined systolic (congestive) and diastolic (congestive) heart failure: Secondary | ICD-10-CM

## 2017-04-13 HISTORY — DX: Chronic combined systolic (congestive) and diastolic (congestive) heart failure: I50.42

## 2017-04-13 HISTORY — DX: Other long term (current) drug therapy: Z79.899

## 2017-04-13 HISTORY — DX: Long term (current) use of anticoagulants: Z79.01

## 2017-04-15 ENCOUNTER — Telehealth: Payer: Self-pay | Admitting: *Deleted

## 2017-04-15 NOTE — Telephone Encounter (Signed)
Patient called back to inform me that she spoke with her husband and would like to go ahead and secure a date for procedure.  Informed patient that I would hold a spot for her on 8/10 for procedure and follow up in the next few weeks to confirm (giving time to meet w/ Dr. [name] and her PCP).  Patient thanks me for helping.

## 2017-04-15 NOTE — Telephone Encounter (Signed)
Called to inform patient that Dr. Elberta Fortis reviewed records from [place].  Advised patient it is ok to proceed w/ RFA if she chooses.  Pt is not sure if she would like to proceed or not.  She sees her primary cardiologist next week and is going to talk with him more about it.  I did tell patient that we can wait until she follows up in August, with Dr. Elberta Fortis, before making a decision.  Pt will call me in the next few weeks to let me know her decision.

## 2017-04-16 ENCOUNTER — Telehealth: Payer: Self-pay | Admitting: Cardiology

## 2017-04-16 NOTE — Telephone Encounter (Signed)
°  Follow Up   Calling to provide cell phone number: [phone]

## 2017-04-16 NOTE — Telephone Encounter (Signed)
Pt wanted to make sure her cell phone was on file in case I called while she was out of town.

## 2017-04-19 ENCOUNTER — Encounter: Payer: Self-pay | Admitting: Cardiology

## 2017-04-20 ENCOUNTER — Ambulatory Visit (INDEPENDENT_AMBULATORY_CARE_PROVIDER_SITE_OTHER): Payer: PPO | Admitting: Cardiology

## 2017-04-20 ENCOUNTER — Encounter: Payer: Self-pay | Admitting: Cardiology

## 2017-04-20 VITALS — BP 152/70 | HR 56 | Wt 129.8 lb

## 2017-04-20 DIAGNOSIS — I4892 Unspecified atrial flutter: Secondary | ICD-10-CM | POA: Diagnosis not present

## 2017-04-20 DIAGNOSIS — I480 Paroxysmal atrial fibrillation: Secondary | ICD-10-CM | POA: Diagnosis not present

## 2017-04-20 DIAGNOSIS — I110 Hypertensive heart disease with heart failure: Secondary | ICD-10-CM

## 2017-04-20 DIAGNOSIS — I25118 Atherosclerotic heart disease of native coronary artery with other forms of angina pectoris: Secondary | ICD-10-CM | POA: Diagnosis not present

## 2017-04-20 MED ORDER — AMIODARONE HCL 200 MG PO TABS: 200.0000 mg | ORAL_TABLET | Freq: Two times a day (BID) | ORAL | 3 refills | Status: DC

## 2017-04-20 MED ORDER — HYDRALAZINE HCL 25 MG PO TABS: 12.5000 mg | ORAL_TABLET | Freq: Two times a day (BID) | ORAL | 3 refills | Status: DC

## 2017-04-20 MED ORDER — RIVAROXABAN 20 MG PO TABS: 20.0000 mg | ORAL_TABLET | Freq: Every day | ORAL | 3 refills | Status: DC

## 2017-04-20 NOTE — Progress Notes (Signed)
Cardiology Office Note:    Date:  04/20/2017   ID:  Jennifer Martin, DOB December 05, 1955, MRN [id number]  PCP:  [name]., MD  Cardiologist:  [name], MD    Referring MD: [name]., MD    ASSESSMENT:    1. PAF (paroxysmal atrial fibrillation) (HCC)   2. Atrial flutter, unspecified type (HCC)   3. Coronary artery disease of native artery of native heart with stable angina pectoris (HCC)   4. Hypertensive heart failure (HCC)    PLAN:    In order of problems listed above:  1. Stable continue amiodarone and anticoagulation and pending EP invasive ablation as she is at high risk of long-term complications of amiodarone with underlying COPD. She has been seen by EP and is scheduling. 2. Stable continue current medical treatment including beta blocker calcium channel blocker statin and anticoagulant. 3. Stable no evidence of volume overload continue current diuretics sodium restriction and home self management. 4. Hyperlipidemia stable continue her statin  Follow-up 3 months   Medication Adjustments/Labs and Tests Ordered: Current medicines are reviewed at length with the patient today.  Concerns regarding medicines are outlined above.  Orders Placed This Encounter  Procedures  . Basic metabolic panel  . Pro b natriuretic peptide (BNP)  . Lipid Profile  . EKG 12-Lead   Meds ordered this encounter  Medications  . DISCONTD: hydrALAZINE (APRESOLINE) 25 MG tablet    Sig: Take 0.5 tablets (12.5 mg total) by mouth 2 (two) times daily.    Dispense:  90 tablet    Refill:  3  . DISCONTD: hydrALAZINE (APRESOLINE) 25 MG tablet    Sig: Take 0.5 tablets (12.5 mg total) by mouth 2 (two) times daily.    Dispense:  90 tablet    Refill:  3  . rivaroxaban (XARELTO) 20 MG TABS tablet    Sig: Take 1 tablet (20 mg total) by mouth daily.    Dispense:  90 tablet    Refill:  3  . amiodarone (PACERONE) 200 MG tablet    Sig: Take 1 tablet (200 mg total) by mouth 2 (two) times daily.   Dispense:  180 tablet    Refill:  3  . DISCONTD: hydrALAZINE (APRESOLINE) 25 MG tablet    Sig: Take 0.5 tablets (12.5 mg total) by mouth 2 (two) times daily.    Dispense:  180 tablet    Refill:  3  . hydrALAZINE (APRESOLINE) 25 MG tablet    Sig: Take 0.5 tablets (12.5 mg total) by mouth 2 (two) times daily.    Dispense:  90 tablet    Refill:  3    Chief Complaint  Patient presents with  . Follow-up  . Atrial Fibrillation  . Congestive Heart Failure  . Coronary Artery Disease    History of Present Illness:    Jennifer Martin is a 61 y.o. female with a hx of CAD, Dyslipidemia, HTN, and atrial flutterand exacerbation of COPD with demand ischemia and decompensated heart failure       Past Medical History:  Diagnosis Date  . Atherosclerosis of coronary artery of native heart with angina pectoris (HCC)   . Atrial flutter (HCC)   . CAD (coronary artery disease)   . Demand ischemia (HCC)   . Demand ischemia (HCC)   . Diverticulosis   . Hyperlipidemia   . Hypertension   . Inappropriate ADH syndrome (HCC)   . Inguinal hernia   . Migraine   . Osteoarthritis   .  Pneumonia     Past Surgical History:  Procedure Laterality Date  . ABDOMINAL HYSTERECTOMY    . BREAST LUMPECTOMY    . SHOULDER SURGERY    . TRACHEOSTOMY      Current Medications: Current Meds  Medication Sig  . albuterol (PROVENTIL HFA;VENTOLIN HFA) 108 (90 Base) MCG/ACT inhaler Inhale 2 puffs into the lungs every 6 (six) hours as needed for wheezing or shortness of breath.  Marland Kitchen amiodarone (PACERONE) 200 MG tablet Take 1 tablet (200 mg total) by mouth 2 (two) times daily.  Marland Kitchen atorvastatin (LIPITOR) 20 MG tablet Take 20 mg by mouth daily.  . budesonide-formoterol (SYMBICORT) 160-4.5 MCG/ACT inhaler Inhale 2 puffs into the lungs 2 (two) times daily.  . carvedilol (COREG) 12.5 MG tablet Take 12.5 mg by mouth 2 (two) times daily with a meal.  . clopidogrel (PLAVIX) 75 MG tablet Take 75 mg by mouth daily.  Marland Kitchen  diltiazem (CARDIZEM CD) 120 MG 24 hr capsule Take 120 mg by mouth daily.  Marland Kitchen escitalopram (LEXAPRO) 10 MG tablet Take 5 mg by mouth daily.  . fluticasone (FLONASE) 50 MCG/ACT nasal spray Place 2 sprays into both nostrils daily.  . furosemide (LASIX) 20 MG tablet Take 20 mg by mouth every other day. FOR WEIGHT 131 OR GREATER   . LORazepam (ATIVAN) 1 MG tablet Take 1 mg by mouth every 4 (four) hours as needed for anxiety.   . nitroGLYCERIN (NITROSTAT) 0.4 MG SL tablet Place 0.4 mg under the tongue every 5 (five) minutes as needed for chest pain.  . Omega-3 Fatty Acids (OMEGA-3 FISH OIL) 300 MG CAPS Take 2 g by mouth daily.  . pantoprazole (PROTONIX) 40 MG tablet Take 40 mg by mouth daily.  . potassium chloride SA (K-DUR,KLOR-CON) 20 MEQ tablet Take 20 mEq by mouth daily as needed. IF YOU TAKE FUROSEMIDE  . rivaroxaban (XARELTO) 20 MG TABS tablet Take 1 tablet (20 mg total) by mouth daily.  . [DISCONTINUED] amiodarone (PACERONE) 200 MG tablet Take 200 mg by mouth 2 (two) times daily.  . [DISCONTINUED] rivaroxaban (XARELTO) 20 MG TABS tablet Take 20 mg by mouth daily.     Allergies:   Cefdinir; Levaquin [levofloxacin in d5w]; and Morphine and related   Social History   Social History  . Marital status: Married    Spouse name: N/A  . Number of children: N/A  . Years of education: N/A   Social History Main Topics  . Smoking status: Current Every Day Smoker  . Smokeless tobacco: Never Used  . Alcohol use No  . Drug use: No  . Sexual activity: Not Asked   Other Topics Concern  . None   Social History Narrative  . None     Family History: The patient's family history includes CAD in her brother; Heart disease in her brother. ROS:   Please see the history of present illness.     All other systems reviewed and are negative.  EKGs/Labs/Other Studies Reviewed:    The following studies were reviewed today:   EKG:  EKG is  ordered today.  The ekg ordered today demonstrates stablr  pattern, SRTH LBBB  Recent Labs: No results found for requested labs within last 8760 hours.  Recent Lipid Panel No results found for: CHOL, TRIG, HDL, CHOLHDL, VLDL, LDLCALC, LDLDIRECT  Physical Exam:    VS:  BP (!) 152/70   Pulse (!) 56   Wt 129 lb 12.8 oz (58.9 kg)   SpO2 98%   BMI 25.35 kg/m  Wt Readings from Last 3 Encounters:  04/20/17 129 lb 12.8 oz (58.9 kg)  03/25/17 130 lb (59 kg)     GEN:  Well nourished, well developed in no acute distress HEENT: Normal NECK: No JVD; No carotid bruits LYMPHATICS: No lymphadenopathy CARDIAC: Paradoxical second heart sound RRR, no murmurs, rubs, gallops RESPIRATORY:  Clear to auscultation without rales, wheezing or rhonchi  ABDOMEN: Soft, non-tender, non-distended MUSCULOSKELETAL:  No edema; No deformity  SKIN: Warm and dry NEUROLOGIC:  Alert and oriented x 3 PSYCHIATRIC:  Normal affect    Signed, [name], MD  04/20/2017 12:15 PM    [facility]

## 2017-04-20 NOTE — Patient Instructions (Addendum)
Medication Instructions:  Your physician has recommended you make the following change in your medication: hydralazine 12.5 mg two times daily. You will have a 25 mg tablet that will be cut in half. Refills for amiodarone and xarelto have been sent to your pharmacy.   Labwork: Your physician recommends that you return for lab work in: today. BMP, BNP, lipid.   Testing/Procedures: None  Follow-Up: Your physician recommends that you schedule a follow-up appointment in: 3 months   Any Other Special Instructions Will Be Listed Below (If Applicable).     If you need a refill on your cardiac medications before your next appointment, please call your pharmacy.    Cardiogenic Shock Shock is a life-threatening condition. It is failure to get blood, oxygen, and nutrients to vital organs. Cardiogenic shock is a type of shock that happens when the heart fails to pump blood effectively through the body. This can occur if a damaged or weakened heart cannot pump blood. Cardiogenic shock is a medical emergency and requires immediate treatment. What are the causes? The underlying cause of this condition is heart failure. It may happen suddenly (acute) or gradually over time as many traumas or attacks damage the heart. Common acute causes of cardiogenic shock include:  Heart attack.  Built-up fluid around the heart that accumulates quickly.  Trauma to the heart.  Heart valve problems.  Certain medicines, including medicines that are poisonous (toxic) to the heart.  A tumor in the heart.  Infection of the heart muscle.  A blood clot that has traveled to a lung (pulmonary embolism).  Abnormal heart rhythm.  Viral infection of the heart.  Inflammation of the heart muscle.  Pump failure and cardiogenic shock can develop over time in people who have:  High blood pressure (hypertension).  Diabetes.  A history of heart attacks or other heart problems.  Coronary artery  disease.  Exposure to medicines that are toxic to the heart.  Recurrent pulmonary embolism.  Genetic defects (abnormalities) in the heart muscle.  What are the signs or symptoms? Signs and symptoms of this condition include:  Low blood pressure (hypotension).  Sweating.  Urinating less often than normal.  Nausea.  Fainting.  Weakness.  Pale skin.  Cold hands or feet.  Shallow, quick breathing, or shortness of breath.  Confusion.  Weak pulse.  How is this diagnosed? This condition may be diagnosed by:  Performing a physical exam.  Taking your medical history.  Doing tests, including: ? A check of your blood pressure, pulse, breathing rate, and temperature. ? Blood tests. ? Electrocardiogram (ECG). ? Chest X-ray. ? Echocardiogram. ? Cardiac catheterization. ? Coronary angiogram, which is an X-ray of your heart and blood vessels.  How is this treated? Cardiogenic shock is a life-threatening condition that requires immediate medical care. Treatment helps to get blood flowing through your body again. Treatment may include:  Medicine.  Oxygen given through a mask or a tube.  Use of a machine to help you breathe.  Use of a machine to help your heart pump.  Surgery.  Get help right away if:  You feel light-headed or dizzy.  You pass out.  You suddenly start to sweat or your skin gets clammy.  You feel nauseous or you vomit.  You have shortness of breath.  Your skin is pale. Summary  Shock is a life-threatening condition. It requires immediate medical care.  The underlying cause of this condition is heart failure.  Treatment helps to get blood flowing through your  body again. This information is not intended to replace advice given to you by your health care provider. Make sure you discuss any questions you have with your health care provider. Document Released: 07/15/2008 Document Revised: 08/25/2016 Document Reviewed: 08/25/2016 Elsevier  Interactive Patient Education  2017 [employer].

## 2017-04-21 LAB — LIPID PANEL
CHOL/HDL RATIO: 1.8 ratio (ref 0.0–4.4)
CHOLESTEROL TOTAL: 138 mg/dL (ref 100–199)
HDL: 77 mg/dL (ref >39)
LDL CALC: 44 mg/dL (ref 0–99)
TRIGLYCERIDES: 83 mg/dL (ref 0–149)
VLDL CHOLESTEROL CAL: 17 mg/dL (ref 5–40)

## 2017-04-21 LAB — PRO B NATRIURETIC PEPTIDE: NT-Pro BNP: 795 pg/mL — ABNORMAL HIGH (ref 0–287)

## 2017-04-21 LAB — BASIC METABOLIC PANEL
BUN/Creatinine Ratio: 11 — ABNORMAL LOW (ref 12–28)
BUN: 7 mg/dL — AB (ref 8–27)
CALCIUM: 9.2 mg/dL (ref 8.7–10.3)
CO2: 24 mmol/L (ref 20–29)
CREATININE: 0.65 mg/dL (ref 0.57–1.00)
Chloride: 96 mmol/L (ref 96–106)
GFR, EST AFRICAN AMERICAN: 110 mL/min/{1.73_m2} (ref >59)
GFR, EST NON AFRICAN AMERICAN: 95 mL/min/{1.73_m2} (ref >59)
Glucose: 88 mg/dL (ref 65–99)
POTASSIUM: 4.4 mmol/L (ref 3.5–5.2)
Sodium: 135 mmol/L (ref 134–144)

## 2017-04-27 ENCOUNTER — Telehealth: Payer: Self-pay | Admitting: Cardiology

## 2017-04-27 NOTE — Telephone Encounter (Signed)
Patient states that she stopped the Lipitor, not the amiodarone. Advised patient to continue amiodarone as prescribed. Stop Lipitor for 2 weeks and then resume at half the dose (10 mg or 0.5 tablet). Advised for patient to call if after changing the dosage does not improve the muscle cramps. Patient verbalized understanding.

## 2017-04-27 NOTE — Telephone Encounter (Signed)
Amiodarone and lipitor(combined) are making her legs cramp so she quit taking the amiodarone

## 2017-04-27 NOTE — Telephone Encounter (Signed)
Please advise 

## 2017-04-27 NOTE — Telephone Encounter (Signed)
She needs to stay on amiodarone. Stop Lipitor 2 weeks and resume at 50%

## 2017-04-28 ENCOUNTER — Telehealth: Payer: Self-pay | Admitting: Cardiology

## 2017-04-28 DIAGNOSIS — I480 Paroxysmal atrial fibrillation: Secondary | ICD-10-CM

## 2017-04-28 NOTE — Telephone Encounter (Signed)
Pt calling to follow up on status of heart procedure.  Please give her a call back.

## 2017-04-29 ENCOUNTER — Encounter: Payer: Self-pay | Admitting: *Deleted

## 2017-04-29 NOTE — Addendum Note (Signed)
Addended by: Baird Lyons on: 04/29/2017 06:21 PM   Modules accepted: Orders

## 2017-04-29 NOTE — Telephone Encounter (Signed)
Scheduled AFib ablation 8/10 Pre procedure labs to be completed in the hospital the morning of procedure. Pt understands office will call her to schedule cardiac Ct within 7 days prior to procedure. Letter of instructions reviewed with patient and mailed to home address. Post ablation follow up appointments made. Patient verbalized understanding and agreeable to plan.

## 2017-04-29 NOTE — Telephone Encounter (Signed)
Follow up      Returned a call to the nurse.  Ok to call thurs

## 2017-04-30 ENCOUNTER — Telehealth: Payer: Self-pay | Admitting: Cardiology

## 2017-04-30 ENCOUNTER — Other Ambulatory Visit: Payer: Self-pay | Admitting: Cardiology

## 2017-04-30 NOTE — Telephone Encounter (Signed)
Please advise 

## 2017-04-30 NOTE — Telephone Encounter (Signed)
Have her increase her amiodarone to 2=400 mg twice daily for the next 2 weeks

## 2017-04-30 NOTE — Telephone Encounter (Signed)
Patient is going in and out of rhythm and she is not staying in abn al the time. She callled at 4:50pm and advised her to go to ED but she was not ready to do that.. I informed her that it would be am til returned call and she will got to ED if necessary. She is wondering if we need to adjust meds.Marland Kitchen

## 2017-05-01 NOTE — Telephone Encounter (Signed)
Patient advised to increase amiodarone to 2 tablets (400 mg) twice daily for the next two weeks. Patient verbalized understanding. Advised patient to return call if she continues to have symptoms.

## 2017-05-04 ENCOUNTER — Telehealth: Payer: Self-pay | Admitting: *Deleted

## 2017-05-04 NOTE — Telephone Encounter (Signed)
-----   Message from Sharene Butters, RN sent at 05/01/2017 10:57 AM EDT ----- Regarding: RE: Jennifer Martin follow up Thank you!! :)  ----- Message ----- From: Baird Lyons, RN Sent: 05/01/2017  10:53 AM To: Sharene Butters, RN Subject: RE: Jennifer Martin follow up                           Thanks so much.  I will let patient know  :)  ----- Message ----- From: Sharene Butters, RN Sent: 05/01/2017  10:18 AM To: Baird Lyons, RN Subject: RE: Jennifer Martin follow up                           Hey!  I spoke with Dr. Dulce Martin, she does not have to come see him in September. She can come after she sees Dr. Elberta Fortis in follow-up. Thanks for checking!!  ----- Message ----- From: Baird Lyons, RN Sent: 05/01/2017  10:11 AM To: Baird Lyons, RN, Sharene Butters, RN Subject: Jennifer Martin follow up                               Hi.  My name is Dory Horn - I am the nurse for Dr. Elberta Fortis. Ms Skyberg has recall in for September to follow up with Dr. Dulce Martin.  Was wondering if this should be pushed back at all? Patient is having an AFib ablation with Dr. Elberta Fortis on 8/10 and will follow up with him in November.  Pt wasn't sure if she still needed to see [facility] in September or if should be moved to end of the year, after seeing Camnitz. Advised patient we would update her once reviewed with Dr. Dulce Martin.  Thanks Dory Horn, RN

## 2017-05-04 NOTE — Telephone Encounter (Signed)
Informed patient her follow up w/ Dr. [name] will be pushed back till after she see Dr. Elberta Fortis 3 mo post AFib ablation. (updated recall in [facility]). Patient verbalized understanding and agreeable to plan.

## 2017-05-07 DIAGNOSIS — I482 Chronic atrial fibrillation: Secondary | ICD-10-CM | POA: Diagnosis not present

## 2017-05-07 DIAGNOSIS — R5381 Other malaise: Secondary | ICD-10-CM | POA: Diagnosis not present

## 2017-05-07 DIAGNOSIS — I25118 Atherosclerotic heart disease of native coronary artery with other forms of angina pectoris: Secondary | ICD-10-CM | POA: Diagnosis not present

## 2017-05-07 DIAGNOSIS — Z79899 Other long term (current) drug therapy: Secondary | ICD-10-CM | POA: Diagnosis not present

## 2017-05-07 DIAGNOSIS — R7303 Prediabetes: Secondary | ICD-10-CM | POA: Diagnosis not present

## 2017-05-07 DIAGNOSIS — I10 Essential (primary) hypertension: Secondary | ICD-10-CM | POA: Diagnosis not present

## 2017-05-08 ENCOUNTER — Encounter: Payer: Self-pay | Admitting: Cardiology

## 2017-05-08 ENCOUNTER — Telehealth: Payer: Self-pay | Admitting: Cardiology

## 2017-05-08 NOTE — Telephone Encounter (Signed)
Advised patient that we do not carry samples of Spiriva and she should call her PCP.

## 2017-05-08 NOTE — Telephone Encounter (Signed)
Wants samples of Spiriva?

## 2017-05-13 ENCOUNTER — Encounter: Payer: Self-pay | Admitting: Cardiology

## 2017-05-18 ENCOUNTER — Telehealth: Payer: Self-pay | Admitting: Cardiology

## 2017-05-18 NOTE — Telephone Encounter (Signed)
Lipitor hurts her legs so she's not going to take it

## 2017-05-18 NOTE — Telephone Encounter (Signed)
Spoke with patient. She states that she stopped Lipitor for 2 weeks, then started cutting dose in half. Still having a lot of pain in legs. She believes that it could be taking it in combination with amiodarone. Advised patient that results of lipid panel from last month were stable and okay to stop for one week until reviewed with Dr. [name]. Patient states that she is scheduled for an ablation 05/29/17 with Dr. Elberta Fortis, and believes she may be able to come off amiodarone then.

## 2017-05-21 ENCOUNTER — Ambulatory Visit (HOSPITAL_COMMUNITY): Payer: PPO

## 2017-05-27 ENCOUNTER — Ambulatory Visit (HOSPITAL_COMMUNITY)
Admission: RE | Admit: 2017-05-27 | Discharge: 2017-05-27 | Disposition: A | Discharge status: Home / Self Care | Payer: PPO | Source: Ambulatory Visit | Attending: Cardiology | Admitting: Cardiology

## 2017-05-27 ENCOUNTER — Ambulatory Visit (HOSPITAL_COMMUNITY): Admission: RE | Admit: 2017-05-27 | Payer: PPO | Source: Ambulatory Visit

## 2017-05-27 ENCOUNTER — Encounter (HOSPITAL_COMMUNITY): Payer: Self-pay

## 2017-05-27 ENCOUNTER — Ambulatory Visit: Payer: PPO | Admitting: Cardiology

## 2017-05-27 DIAGNOSIS — I4891 Unspecified atrial fibrillation: Secondary | ICD-10-CM | POA: Diagnosis not present

## 2017-05-27 DIAGNOSIS — I480 Paroxysmal atrial fibrillation: Secondary | ICD-10-CM | POA: Insufficient documentation

## 2017-05-27 DIAGNOSIS — R911 Solitary pulmonary nodule: Secondary | ICD-10-CM | POA: Diagnosis not present

## 2017-05-27 DIAGNOSIS — I700 Atherosclerosis of aorta: Secondary | ICD-10-CM | POA: Diagnosis not present

## 2017-05-27 MED ORDER — NITROGLYCERIN 0.4 MG SL SUBL
0.4000 mg | SUBLINGUAL_TABLET | SUBLINGUAL | Status: DC | PRN
  Administered 2017-05-27: 0.4 mg via SUBLINGUAL
  Filled 2017-05-27 (×2): qty 25

## 2017-05-27 MED ORDER — NITROGLYCERIN 0.4 MG SL SUBL
SUBLINGUAL_TABLET | SUBLINGUAL | Status: AC
  Filled 2017-05-27: qty 1

## 2017-05-27 MED ORDER — IOPAMIDOL (ISOVUE-370) INJECTION 76%
INTRAVENOUS | Status: AC
  Administered 2017-05-27: 80 mL
  Filled 2017-05-27: qty 100

## 2017-05-29 ENCOUNTER — Ambulatory Visit (HOSPITAL_COMMUNITY): Payer: PPO | Admitting: Certified Registered"

## 2017-05-29 ENCOUNTER — Ambulatory Visit (HOSPITAL_COMMUNITY)
Admission: RE | Disposition: A | Discharge status: Home / Self Care | Payer: Self-pay | Source: Ambulatory Visit | Attending: Cardiology

## 2017-05-29 ENCOUNTER — Ambulatory Visit (HOSPITAL_COMMUNITY)
Admission: RE | Admit: 2017-05-29 | Discharge: 2017-05-30 | Disposition: A | Discharge status: Home / Self Care | Payer: PPO | Source: Ambulatory Visit | Attending: Cardiology | Admitting: Cardiology

## 2017-05-29 ENCOUNTER — Encounter (HOSPITAL_COMMUNITY): Payer: Self-pay | Admitting: Certified Registered"

## 2017-05-29 DIAGNOSIS — Z885 Allergy status to narcotic agent status: Secondary | ICD-10-CM | POA: Insufficient documentation

## 2017-05-29 DIAGNOSIS — I509 Heart failure, unspecified: Secondary | ICD-10-CM | POA: Insufficient documentation

## 2017-05-29 DIAGNOSIS — E785 Hyperlipidemia, unspecified: Secondary | ICD-10-CM | POA: Diagnosis not present

## 2017-05-29 DIAGNOSIS — M1990 Unspecified osteoarthritis, unspecified site: Secondary | ICD-10-CM | POA: Diagnosis not present

## 2017-05-29 DIAGNOSIS — I480 Paroxysmal atrial fibrillation: Secondary | ICD-10-CM | POA: Diagnosis present

## 2017-05-29 DIAGNOSIS — I481 Persistent atrial fibrillation: Secondary | ICD-10-CM | POA: Insufficient documentation

## 2017-05-29 DIAGNOSIS — I4892 Unspecified atrial flutter: Secondary | ICD-10-CM

## 2017-05-29 DIAGNOSIS — I483 Typical atrial flutter: Secondary | ICD-10-CM | POA: Diagnosis not present

## 2017-05-29 DIAGNOSIS — I2510 Atherosclerotic heart disease of native coronary artery without angina pectoris: Secondary | ICD-10-CM | POA: Diagnosis not present

## 2017-05-29 DIAGNOSIS — I4891 Unspecified atrial fibrillation: Secondary | ICD-10-CM | POA: Diagnosis not present

## 2017-05-29 DIAGNOSIS — J449 Chronic obstructive pulmonary disease, unspecified: Secondary | ICD-10-CM | POA: Diagnosis not present

## 2017-05-29 DIAGNOSIS — F17210 Nicotine dependence, cigarettes, uncomplicated: Secondary | ICD-10-CM | POA: Diagnosis not present

## 2017-05-29 DIAGNOSIS — I5032 Chronic diastolic (congestive) heart failure: Secondary | ICD-10-CM | POA: Diagnosis not present

## 2017-05-29 DIAGNOSIS — I110 Hypertensive heart disease with heart failure: Secondary | ICD-10-CM | POA: Diagnosis not present

## 2017-05-29 DIAGNOSIS — Z7901 Long term (current) use of anticoagulants: Secondary | ICD-10-CM | POA: Diagnosis not present

## 2017-05-29 DIAGNOSIS — Z7902 Long term (current) use of antithrombotics/antiplatelets: Secondary | ICD-10-CM | POA: Insufficient documentation

## 2017-05-29 DIAGNOSIS — I25118 Atherosclerotic heart disease of native coronary artery with other forms of angina pectoris: Secondary | ICD-10-CM

## 2017-05-29 DIAGNOSIS — Z79899 Other long term (current) drug therapy: Secondary | ICD-10-CM | POA: Diagnosis not present

## 2017-05-29 DIAGNOSIS — Z8249 Family history of ischemic heart disease and other diseases of the circulatory system: Secondary | ICD-10-CM | POA: Diagnosis not present

## 2017-05-29 HISTORY — PX: ATRIAL FIBRILLATION ABLATION: EP1191

## 2017-05-29 HISTORY — DX: Paroxysmal atrial fibrillation: I48.0

## 2017-05-29 LAB — BASIC METABOLIC PANEL
Anion gap: 9 (ref 5–15)
BUN: 10 mg/dL (ref 6–20)
CO2: 27 mmol/L (ref 22–32)
CREATININE: 0.53 mg/dL (ref 0.44–1.00)
Calcium: 8.7 mg/dL — ABNORMAL LOW (ref 8.9–10.3)
Chloride: 101 mmol/L (ref 101–111)
GFR calc Af Amer: >60 mL/min (ref >60)
Glucose, Bld: 102 mg/dL — ABNORMAL HIGH (ref 65–99)
Potassium: 3.1 mmol/L — ABNORMAL LOW (ref 3.5–5.1)
SODIUM: 137 mmol/L (ref 135–145)

## 2017-05-29 LAB — GLUCOSE, CAPILLARY: Glucose-Capillary: 115 mg/dL — ABNORMAL HIGH (ref 65–99)

## 2017-05-29 LAB — CBC
HCT: 36.1 % (ref 36.0–46.0)
Hemoglobin: 12 g/dL (ref 12.0–15.0)
MCH: 29.6 pg (ref 26.0–34.0)
MCHC: 33.2 g/dL (ref 30.0–36.0)
MCV: 88.9 fL (ref 78.0–100.0)
PLATELETS: 262 10*3/uL (ref 150–400)
RBC: 4.06 MIL/uL (ref 3.87–5.11)
RDW: 13.6 % (ref 11.5–15.5)
WBC: 6.9 10*3/uL (ref 4.0–10.5)

## 2017-05-29 LAB — POCT ACTIVATED CLOTTING TIME
ACTIVATED CLOTTING TIME: 301 s
ACTIVATED CLOTTING TIME: 301 s
ACTIVATED CLOTTING TIME: 312 s
ACTIVATED CLOTTING TIME: 120 s
Activated Clotting Time: 329 seconds

## 2017-05-29 SURGERY — ATRIAL FIBRILLATION ABLATION
Anesthesia: General

## 2017-05-29 MED ORDER — HEPARIN SODIUM (PORCINE) 1000 UNIT/ML IJ SOLN
INTRAMUSCULAR | Status: DC | PRN
  Administered 2017-05-29: 1000 [IU] via INTRAVENOUS

## 2017-05-29 MED ORDER — MIDAZOLAM HCL 2 MG/2ML IJ SOLN
INTRAMUSCULAR | Status: DC | PRN
  Administered 2017-05-29 (×2): 1 mg via INTRAVENOUS

## 2017-05-29 MED ORDER — LIDOCAINE HCL (PF) 1 % IJ SOLN
INTRAMUSCULAR | Status: AC
  Filled 2017-05-29: qty 30

## 2017-05-29 MED ORDER — FENTANYL CITRATE (PF) 100 MCG/2ML IJ SOLN
INTRAMUSCULAR | Status: DC | PRN
  Administered 2017-05-29: 50 ug via INTRAVENOUS
  Administered 2017-05-29 (×2): 25 ug via INTRAVENOUS

## 2017-05-29 MED ORDER — ONDANSETRON HCL 4 MG/2ML IJ SOLN
INTRAMUSCULAR | Status: DC | PRN
  Administered 2017-05-29: 4 mg via INTRAVENOUS

## 2017-05-29 MED ORDER — HEPARIN SODIUM (PORCINE) 1000 UNIT/ML IJ SOLN
INTRAMUSCULAR | Status: DC | PRN
  Administered 2017-05-29: 2000 [IU] via INTRAVENOUS
  Administered 2017-05-29: 3000 [IU] via INTRAVENOUS
  Administered 2017-05-29: 13000 [IU] via INTRAVENOUS
  Administered 2017-05-29: 1000 [IU] via INTRAVENOUS

## 2017-05-29 MED ORDER — OMEGA-3-ACID ETHYL ESTERS 1 G PO CAPS
1.0000 g | ORAL_CAPSULE | ORAL | Status: DC
  Administered 2017-05-29: 1 g via ORAL
  Filled 2017-05-29: qty 1

## 2017-05-29 MED ORDER — PHENYLEPHRINE 40 MCG/ML (10ML) SYRINGE FOR IV PUSH (FOR BLOOD PRESSURE SUPPORT)
PREFILLED_SYRINGE | INTRAVENOUS | Status: DC | PRN
  Administered 2017-05-29: 40 ug via INTRAVENOUS

## 2017-05-29 MED ORDER — SODIUM CHLORIDE 0.9% FLUSH
3.0000 mL | Freq: Two times a day (BID) | INTRAVENOUS | Status: DC
  Administered 2017-05-29 – 2017-05-30 (×2): 3 mL via INTRAVENOUS

## 2017-05-29 MED ORDER — HEPARIN (PORCINE) IN NACL 2-0.9 UNIT/ML-% IJ SOLN
INTRAMUSCULAR | Status: AC
  Filled 2017-05-29: qty 500

## 2017-05-29 MED ORDER — CARVEDILOL 12.5 MG PO TABS
12.5000 mg | ORAL_TABLET | Freq: Two times a day (BID) | ORAL | Status: DC
  Administered 2017-05-29 – 2017-05-30 (×2): 12.5 mg via ORAL
  Filled 2017-05-29 (×2): qty 1

## 2017-05-29 MED ORDER — FLUTICASONE PROPIONATE 50 MCG/ACT NA SUSP
2.0000 | Freq: Every day | NASAL | Status: DC | PRN
  Filled 2017-05-29: qty 16

## 2017-05-29 MED ORDER — POTASSIUM CHLORIDE CRYS ER 20 MEQ PO TBCR
EXTENDED_RELEASE_TABLET | ORAL | Status: AC
  Administered 2017-05-29: 40 meq via ORAL
  Filled 2017-05-29: qty 2

## 2017-05-29 MED ORDER — PROTAMINE SULFATE 10 MG/ML IV SOLN
INTRAVENOUS | Status: DC | PRN
  Administered 2017-05-29 (×4): 10 mg via INTRAVENOUS

## 2017-05-29 MED ORDER — LORAZEPAM 1 MG PO TABS
1.0000 mg | ORAL_TABLET | Freq: Three times a day (TID) | ORAL | Status: DC | PRN
  Administered 2017-05-29 – 2017-05-30 (×3): 1 mg via ORAL
  Filled 2017-05-29 (×3): qty 1

## 2017-05-29 MED ORDER — SODIUM CHLORIDE 0.9 % IV SOLN
INTRAVENOUS | Status: DC | PRN
  Administered 2017-05-29: 11:00:00 via INTRAVENOUS

## 2017-05-29 MED ORDER — BUPIVACAINE HCL (PF) 0.25 % IJ SOLN
INTRAMUSCULAR | Status: DC | PRN
  Administered 2017-05-29: 35 mL

## 2017-05-29 MED ORDER — OMEGA-3 FISH OIL 300 MG PO CAPS: 300.0000 mg | ORAL_CAPSULE | Freq: Every day | ORAL | Status: DC

## 2017-05-29 MED ORDER — DOBUTAMINE IN D5W 4-5 MG/ML-% IV SOLN
INTRAVENOUS | Status: DC | PRN
Start: 2017-05-29 — End: 2017-05-29
  Administered 2017-05-29: 20 ug/kg/min via INTRAVENOUS

## 2017-05-29 MED ORDER — SODIUM CHLORIDE 0.9 % IV SOLN: 250.0000 mL | INTRAVENOUS | Status: DC | PRN

## 2017-05-29 MED ORDER — PROPOFOL 10 MG/ML IV BOLUS
INTRAVENOUS | Status: DC | PRN
  Administered 2017-05-29: 150 mg via INTRAVENOUS

## 2017-05-29 MED ORDER — HEPARIN SODIUM (PORCINE) 1000 UNIT/ML IJ SOLN
INTRAMUSCULAR | Status: AC
  Filled 2017-05-29: qty 1

## 2017-05-29 MED ORDER — HEPARIN (PORCINE) IN NACL 2-0.9 UNIT/ML-% IJ SOLN
INTRAMUSCULAR | Status: AC | PRN
  Administered 2017-05-29: 2000 mL

## 2017-05-29 MED ORDER — FUROSEMIDE 20 MG PO TABS
20.0000 mg | ORAL_TABLET | ORAL | Status: DC
  Administered 2017-05-29: 20 mg via ORAL
  Filled 2017-05-29: qty 1

## 2017-05-29 MED ORDER — ACETAMINOPHEN 325 MG PO TABS
650.0000 mg | ORAL_TABLET | ORAL | Status: DC | PRN
  Administered 2017-05-30: 650 mg via ORAL
  Filled 2017-05-29: qty 2

## 2017-05-29 MED ORDER — ATORVASTATIN CALCIUM 10 MG PO TABS
10.0000 mg | ORAL_TABLET | Freq: Every day | ORAL | Status: DC
  Administered 2017-05-30: 10 mg via ORAL
  Filled 2017-05-29: qty 1

## 2017-05-29 MED ORDER — MOMETASONE FURO-FORMOTEROL FUM 200-5 MCG/ACT IN AERO
2.0000 | INHALATION_SPRAY | Freq: Two times a day (BID) | RESPIRATORY_TRACT | Status: DC
  Administered 2017-05-29 – 2017-05-30 (×2): 2 via RESPIRATORY_TRACT
  Filled 2017-05-29: qty 8.8

## 2017-05-29 MED ORDER — PANTOPRAZOLE SODIUM 40 MG PO TBEC
40.0000 mg | DELAYED_RELEASE_TABLET | Freq: Every day | ORAL | Status: DC
  Administered 2017-05-30: 40 mg via ORAL
  Filled 2017-05-29: qty 1

## 2017-05-29 MED ORDER — ROCURONIUM BROMIDE 10 MG/ML (PF) SYRINGE
PREFILLED_SYRINGE | INTRAVENOUS | Status: DC | PRN
  Administered 2017-05-29: 40 mg via INTRAVENOUS

## 2017-05-29 MED ORDER — SUCCINYLCHOLINE CHLORIDE 20 MG/ML IJ SOLN
INTRAMUSCULAR | Status: DC | PRN
  Administered 2017-05-29: 100 mg via INTRAVENOUS

## 2017-05-29 MED ORDER — RIVAROXABAN 20 MG PO TABS
20.0000 mg | ORAL_TABLET | Freq: Every day | ORAL | Status: DC
  Administered 2017-05-29 – 2017-05-30 (×2): 20 mg via ORAL
  Filled 2017-05-29 (×2): qty 1

## 2017-05-29 MED ORDER — ALBUTEROL SULFATE (2.5 MG/3ML) 0.083% IN NEBU: 3.0000 mL | INHALATION_SOLUTION | Freq: Four times a day (QID) | RESPIRATORY_TRACT | Status: DC | PRN

## 2017-05-29 MED ORDER — DOBUTAMINE IN D5W 4-5 MG/ML-% IV SOLN
INTRAVENOUS | Status: AC
  Filled 2017-05-29: qty 250

## 2017-05-29 MED ORDER — HYDRALAZINE HCL 25 MG PO TABS
12.5000 mg | ORAL_TABLET | Freq: Two times a day (BID) | ORAL | Status: DC
  Administered 2017-05-29 – 2017-05-30 (×2): 12.5 mg via ORAL
  Filled 2017-05-29 (×2): qty 1

## 2017-05-29 MED ORDER — PHENYLEPHRINE HCL 10 MG/ML IJ SOLN
INTRAVENOUS | Status: DC | PRN
  Administered 2017-05-29: 25 ug/min via INTRAVENOUS

## 2017-05-29 MED ORDER — SODIUM CHLORIDE 0.9% FLUSH: 3.0000 mL | INTRAVENOUS | Status: DC | PRN

## 2017-05-29 MED ORDER — POTASSIUM CHLORIDE CRYS ER 20 MEQ PO TBCR
20.0000 meq | EXTENDED_RELEASE_TABLET | Freq: Every day | ORAL | Status: DC
  Administered 2017-05-29 – 2017-05-30 (×2): 20 meq via ORAL
  Filled 2017-05-29 (×2): qty 1

## 2017-05-29 MED ORDER — LIDOCAINE 2% (20 MG/ML) 5 ML SYRINGE
INTRAMUSCULAR | Status: DC | PRN
  Administered 2017-05-29: 80 mg via INTRAVENOUS

## 2017-05-29 MED ORDER — ACETAMINOPHEN 500 MG PO TABS
1000.0000 mg | ORAL_TABLET | Freq: Two times a day (BID) | ORAL | Status: DC | PRN
  Administered 2017-05-29 – 2017-05-30 (×2): 1000 mg via ORAL
  Filled 2017-05-29 (×2): qty 2

## 2017-05-29 MED ORDER — CLOPIDOGREL BISULFATE 75 MG PO TABS
75.0000 mg | ORAL_TABLET | Freq: Every day | ORAL | Status: DC
  Administered 2017-05-29 – 2017-05-30 (×2): 75 mg via ORAL
  Filled 2017-05-29 (×2): qty 1

## 2017-05-29 MED ORDER — NITROGLYCERIN 0.4 MG SL SUBL: 0.4000 mg | SUBLINGUAL_TABLET | SUBLINGUAL | Status: DC | PRN

## 2017-05-29 MED ORDER — DILTIAZEM HCL ER COATED BEADS 120 MG PO CP24
120.0000 mg | ORAL_CAPSULE | Freq: Every day | ORAL | Status: DC
  Administered 2017-05-30: 120 mg via ORAL
  Filled 2017-05-29: qty 1

## 2017-05-29 MED ORDER — DOBUTAMINE-DEXTROSE 2-5 MG/ML-% IV SOLN: INTRAVENOUS | Status: DC | PRN

## 2017-05-29 MED ORDER — AMIODARONE HCL 200 MG PO TABS
200.0000 mg | ORAL_TABLET | Freq: Every day | ORAL | Status: DC
  Administered 2017-05-29 – 2017-05-30 (×2): 200 mg via ORAL
  Filled 2017-05-29 (×2): qty 1

## 2017-05-29 MED ORDER — ONDANSETRON HCL 4 MG/2ML IJ SOLN: 4.0000 mg | Freq: Four times a day (QID) | INTRAMUSCULAR | Status: DC | PRN

## 2017-05-29 MED ORDER — POTASSIUM CHLORIDE CRYS ER 20 MEQ PO TBCR
40.0000 meq | EXTENDED_RELEASE_TABLET | Freq: Once | ORAL | Status: AC
  Administered 2017-05-29: 40 meq via ORAL

## 2017-05-29 MED ORDER — ESCITALOPRAM OXALATE 10 MG PO TABS
5.0000 mg | ORAL_TABLET | Freq: Every day | ORAL | Status: DC
  Administered 2017-05-30: 5 mg via ORAL
  Filled 2017-05-29: qty 1

## 2017-05-29 SURGICAL SUPPLY — 18 items
BAG SNAP BAND KOVER 36X36 (MISCELLANEOUS) ×3 IMPLANT
BLANKET WARM UNDERBOD FULL ACC (MISCELLANEOUS) ×6 IMPLANT
CATH DUODECA HALO/ISMUS 7FR (CATHETERS) ×3 IMPLANT
CATH SMTCH THERMOCOOL SF DF (CATHETERS) ×3 IMPLANT
CATH SOUNDSTAR ECO REPROCESSED (CATHETERS) ×3 IMPLANT
CATH VARIABLE LASSO NAV 2515 (CATHETERS) ×3 IMPLANT
CATH WEBSTER BI DIR CS D-F CRV (CATHETERS) ×3 IMPLANT
COVER SWIFTLINK CONNECTOR (BAG) ×3 IMPLANT
NEEDLE TRANSSEPTAL BRK 98CM (NEEDLE) ×3 IMPLANT
PACK EP LATEX FREE (CUSTOM PROCEDURE TRAY) ×2
PACK EP LF (CUSTOM PROCEDURE TRAY) ×1 IMPLANT
PAD DEFIB LIFELINK (PAD) ×3 IMPLANT
PATCH CARTO3 (PAD) ×3 IMPLANT
SHEATH AVANTI 11F 11CM (SHEATH) ×3 IMPLANT
SHEATH PINNACLE 7F 10CM (SHEATH) ×3 IMPLANT
SHEATH PINNACLE 8F 10CM (SHEATH) ×6 IMPLANT
SHEATH PINNACLE 9F 10CM (SHEATH) ×6 IMPLANT
TUBING SMART ABLATE COOLFLOW (TUBING) ×3 IMPLANT

## 2017-05-29 NOTE — Transfer of Care (Signed)
Immediate Anesthesia Transfer of Care Note  Patient: Jennifer Martin  Procedure(s) Performed: Procedure(s): Atrial Fibrillation Ablation (N/A)  Patient Location: Cath Lab  Anesthesia Type:General  Level of Consciousness: awake, alert  and oriented  Airway & Oxygen Therapy: Patient Spontanous Breathing and Patient connected to nasal cannula oxygen  Post-op Assessment: Report given to RN and Patient moving all extremities X 4  Post vital signs: Reviewed and stable  Last Vitals:  Vitals:   05/29/17 0904 05/29/17 1448  BP: (!) 165/82   Pulse: 60   Resp: 18   Temp: 36.4 C (!) 36.2 C  SpO2: 98%     Last Pain:  Vitals:   05/29/17 1448  TempSrc: Temporal      Patients Stated Pain Goal: 3 (05/29/17 0927)  Complications: No apparent anesthesia complications

## 2017-05-29 NOTE — Progress Notes (Signed)
Site area: left groin fv sheaths x2 Site Prior to Removal:  Level 0 Pressure Applied For: 20 minutes Manual:   yes Patient Status During Pull:  stable Post Pull Site:  Level 0 Post Pull Instructions Given:  yes Post Pull Pulses Present: palpable Dressing Applied:  Gauze and tegaderm Bedrest begins @ 1550 Comments:  IV saline locked

## 2017-05-29 NOTE — Discharge Summary (Signed)
ELECTROPHYSIOLOGY PROCEDURE DISCHARGE SUMMARY    Patient ID: Jennifer Martin,  MRN: [id number], DOB/AGE: 05-30-1956 60 y.o.  Admit date: 05/29/2017 Discharge date: 05/30/2017  Primary Care Physician: [name]., MD Primary Cardiologist: Dr. [name] Electrophysiologist: Dr. Elberta Fortis  Primary Discharge Diagnosis:  Persistent atrial fibrillation/atrial flutter status post ablation this admission  Secondary Discharge Diagnosis:  1.  CAD 2.  Hypertension 3.  Hyperlipidemia  Procedures This Admission:  1.  Electrophysiology study and radiofrequency catheter ablation on 05/29/17 by Dr Elberta Fortis.  This study demonstrated sinus rhythm upon presentation; successful electrical isolation and anatomical encircling of all four pulmonary veins with radiofrequency current; cavo-tricuspid isthmus ablation was performed with complete bidirectional isthmus block achieved; no inducible arrhythmias following ablation both on and off of dobutamine; no early apparent complications.    Brief HPI: Jennifer Martin is a 61 y.o. female with a history of persistent atrial fibrillation and atrial flutter.  They have failed medical therapy with amiodarone. Risks, benefits, and alternatives to catheter ablation of atrial fibrillation were reviewed with the patient who wished to proceed.  The patient underwent cardiac CT prior to the procedure which demonstrated no LAA thrombus.    Hospital Course:  The patient was admitted and underwent EPS/RFCA of atrial fibrillation with details as outlined above.  They were monitored on telemetry overnight which demonstrated NSR with no ectopic events.  Groin was without complication on the day of discharge. The patient was examined and considered to be stable for discharge.  Wound care and restrictions were reviewed with the patient.The patient Jennifer Martin be seen back by Rudi Coco, NP in 4 weeks and Dr Elberta Fortis in 12 weeks for post ablation follow-up.   This patients CHA2DS2-VASc Score  and unadjusted Ischemic Stroke Rate (% per year) is equal to 3.2 % stroke rate/year from a score of 3 (HTN, Female, CAD), therefore Jennifer Martin continue on Xarelto.    Physical Exam: Vitals:   05/30/17 0557 05/30/17 0703 05/30/17 0824 05/30/17 0940  BP: 135/82   (!) 146/94  Pulse: 64   64  Resp: 18     Temp:  98.1 F (36.7 C)    TempSrc:      SpO2: 90%  90%   Weight: 132 lb 11.2 oz (60.2 kg)     Height:        GEN- The patient is well appearing, alert and oriented x 3 today.   HEENT: normocephalic, atraumatic; sclera clear, conjunctiva pink; hearing intact; oropharynx clear; neck supple  Lungs- Clear to ausculation bilaterally, normal work of breathing.  No wheezes, rales, rhonchi Heart- Regular rate and rhythm, no murmurs, rubs or gallops  GI- soft, non-tender, non-distended, bowel sounds present  Extremities- no clubbing, cyanosis, or edema; DP/PT/radial pulses 2+ bilaterally, groin without hematoma/bruit MS- no significant deformity or atrophy Skin- warm and dry, no rash or lesion Psych- euthymic mood, full affect Neuro- strength and sensation are intact   Labs:   Lab Results  Component Value Date   WBC 6.9 05/29/2017   HGB 12.0 05/29/2017   HCT 36.1 05/29/2017   MCV 88.9 05/29/2017   PLT 262 05/29/2017     Recent Labs Lab 05/29/17 0921  NA 137  K 3.1*  CL 101  CO2 27  BUN 10  CREATININE 0.53  CALCIUM 8.7*  GLUCOSE 102*     Discharge Medications:  Allergies as of 05/30/2017      Reactions   Cefdinir Diarrhea   Levaquin [levofloxacin In D5w] Diarrhea   Morphine  And Related Nausea Only      Medication List    TAKE these medications   acetaminophen 500 MG tablet Commonly known as:  TYLENOL Take 1,000 mg by mouth 2 (two) times daily as needed for mild pain or headache.   albuterol 108 (90 Base) MCG/ACT inhaler Commonly known as:  PROVENTIL HFA;VENTOLIN HFA Inhale 2 puffs into the lungs every 6 (six) hours as needed for wheezing or shortness of breath.     amiodarone 200 MG tablet Commonly known as:  PACERONE Take 1 tablet (200 mg total) by mouth daily. What changed:  when to take this   atorvastatin 20 MG tablet Commonly known as:  LIPITOR Take 10 mg by mouth daily.   budesonide-formoterol 160-4.5 MCG/ACT inhaler Commonly known as:  SYMBICORT Inhale 2 puffs into the lungs 2 (two) times daily.   CARDIZEM CD 120 MG 24 hr capsule Generic drug:  diltiazem Take 120 mg by mouth daily.   carvedilol 12.5 MG tablet Commonly known as:  COREG Take 12.5 mg by mouth 2 (two) times daily with a meal.   clopidogrel 75 MG tablet Commonly known as:  PLAVIX Take 75 mg by mouth daily.   colchicine 0.6 MG tablet Take 1 tablet (0.6 mg total) by mouth 2 (two) times daily.   escitalopram 10 MG tablet Commonly known as:  LEXAPRO Take 5 mg by mouth daily.   fluticasone 50 MCG/ACT nasal spray Commonly known as:  FLONASE Place 2 sprays into both nostrils daily as needed for allergies.   furosemide 20 MG tablet Commonly known as:  LASIX Take 20 mg by mouth every other day. FOR WEIGHT 131 OR GREATER   hydrALAZINE 25 MG tablet Commonly known as:  APRESOLINE Take 0.5 tablets (12.5 mg total) by mouth 2 (two) times daily.   LORazepam 1 MG tablet Commonly known as:  ATIVAN Take 1 mg by mouth every 8 (eight) hours as needed for anxiety.   nitroGLYCERIN 0.4 MG SL tablet Commonly known as:  NITROSTAT Place 0.4 mg under the tongue every 5 (five) minutes as needed for chest pain.   Omega-3 Fish Oil 300 MG Caps Take 300 mg by mouth daily.   pantoprazole 40 MG tablet Commonly known as:  PROTONIX Take 40 mg by mouth daily.   potassium chloride SA 20 MEQ tablet Commonly known as:  K-DUR,KLOR-CON Take 20 mEq by mouth daily as needed. IF YOU TAKE FUROSEMIDE   rivaroxaban 20 MG Tabs tablet Commonly known as:  XARELTO Take 1 tablet (20 mg total) by mouth daily.       Disposition:  Discharge Instructions    Discharge instructions    Complete  by:  As directed    No driving for 4 days. No lifting over 5 lbs for 1 week. No sexual activity for 1 week. You may return to work in 1 week. Keep procedure site clean & dry. If you notice increased pain, swelling, bleeding or pus, call/return!  You may shower, but no soaking baths/hot tubs/pools for 1 week.   You have an appointment set up with the Atrial Fibrillation Clinic.  Multiple studies have shown that being followed by a dedicated atrial fibrillation clinic in addition to the standard care you receive from your other physicians improves health. We believe that enrollment in the atrial fibrillation clinic [facility] Sol allow [place] to better care for you.   The phone number to the Atrial Fibrillation Clinic is [phone]. The clinic is staffed Monday through Friday from 8:30am to 5pm.  Parking Directions: The clinic is located in the Heart and Vascular Building connected to [facility]. 1)From [address] turn on to [address] and go to the 3rd entrance  (Heart and Vascular entrance) on the right. 2)Look to the right for Heart &Vascular Parking Garage. 3)A code for the entrance is required please call the clinic to receive this.   4)Take the elevators to the 1st floor. Registration is in the room with the glass walls at the end of the hallway.  If you have any trouble parking or locating the clinic, please don't hesitate to call [phone].     Follow-up Information    [facility] Follow up on 06/30/2017.   Specialty:  Cardiology Why:  Follow-up with the Atrial Fibrillation Clinic on 06/30/2017 at 11:30 AM.  Contact information: [address] [id number] mc [address] [place] [address] [phone]       Regan Lemming, MD Follow up on 09/09/2017.   Specialty:  Cardiology Why:  at 2:00PM.  Contact information: [address] [place] [address] [phone]           Duration of Discharge Encounter: Greater  than 30 minutes including physician time.  Signed, Ellsworth Lennox, PA-C 05/30/2017, 10:10 AM Pager: [phone]   I have seen and examined this patient with Jennifer Martin.  Agree with above, note added to reflect my findings.  On exam, RRR, no murmurs, lungs clear. Atrial fibrillation and atrial flutter ablation yesterday.  Tolerated the procedure well. Does have some residual chest pain that appears to be pericardial in nature. We'll plan for discharge home today with colchicine, Protonix, and daily amiodarone. Plan see back in A. fib clinic in 2 weeks.  Bryttney Netzer M. Terril Amaro MD 05/30/2017 10:47 AM

## 2017-05-29 NOTE — H&P (Signed)
History of Present Illness: Jennifer Martin is a 61 y.o. female who is being seen today for the evaluation of atrial flutter at the request of [name]. Presenting today for electrophysiology evaluation. She has a history of coronary artery disease, hyperlipidemia, hypertension, and atrial flutter. She also has COPD with a demand ischemia and heart failure. She was admitted to [facility] in atrial fibrillation in April. She has no angina only mild shortness of breath, felt related to her COPD. She has not had syncope or bleeding complications of her anticoagulants. She was placed on amiodarone during her hospitalization in April. Since that time, she is only noted. Episodes of palpitations. She was diagnosed with atrial flutter many years ago. She has also had atrial fibrillation in the past. She presents for ablation of atrial fibrillation/flutter.    Today, denies symptoms of palpitations, chest pain, shortness of breath, orthopnea, PND, lower extremity edema, claudication, dizziness, presyncope, syncope, bleeding, or neurologic sequela. The patient is tolerating medications without difficulties and is otherwise without complaint today.         Past Medical History:  Diagnosis Date  . Atherosclerosis of coronary artery of native heart with angina pectoris (HCC)   . Atrial flutter (HCC)   . CAD (coronary artery disease)   . Diverticulosis   . Hyperlipidemia   . Hypertension   . Inappropriate ADH syndrome (HCC)   . Inguinal hernia   . Migraine   . Osteoarthritis   . Pneumonia         Past Surgical History:  Procedure Laterality Date  . ABDOMINAL HYSTERECTOMY    . BREAST LUMPECTOMY    . SHOULDER SURGERY    . TRACHEOSTOMY             Current Outpatient Prescriptions  Medication Sig Dispense Refill  . albuterol (PROVENTIL HFA;VENTOLIN HFA) 108 (90 Base) MCG/ACT inhaler Inhale 2 puffs into the lungs every 6 (six) hours as needed for wheezing or  shortness of breath.    Marland Kitchen amiodarone (PACERONE) 200 MG tablet Take 200 mg by mouth 2 (two) times daily.    Marland Kitchen atorvastatin (LIPITOR) 20 MG tablet Take 20 mg by mouth daily.    . budesonide-formoterol (SYMBICORT) 160-4.5 MCG/ACT inhaler Inhale 2 puffs into the lungs 2 (two) times daily.    . carvedilol (COREG) 12.5 MG tablet Take 12.5 mg by mouth 2 (two) times daily with a meal.    . clopidogrel (PLAVIX) 75 MG tablet Take 75 mg by mouth daily.    Marland Kitchen diltiazem (CARDIZEM CD) 120 MG 24 hr capsule Take 120 mg by mouth daily.    Marland Kitchen escitalopram (LEXAPRO) 10 MG tablet Take 5 mg by mouth daily.    . fluticasone (FLONASE) 50 MCG/ACT nasal spray Place 2 sprays into both nostrils daily.    . furosemide (LASIX) 20 MG tablet Take 20 mg by mouth daily as needed for fluid or edema. FOR WEIGHT 131 OR GREATER    . LORazepam (ATIVAN) 1 MG tablet Take 1 mg by mouth 2 (two) times daily as needed for anxiety.    . nitroGLYCERIN (NITROSTAT) 0.4 MG SL tablet Place 0.4 mg under the tongue every 5 (five) minutes as needed for chest pain.    . Omega-3 Fatty Acids (OMEGA-3 FISH OIL) 300 MG CAPS Take 2 g by mouth daily.    . pantoprazole (PROTONIX) 40 MG tablet Take 40 mg by mouth daily.    . potassium chloride SA (K-DUR,KLOR-CON) 20 MEQ tablet Take 20 mEq by  mouth daily as needed. IF YOU TAKE FUROSEMIDE    . rivaroxaban (XARELTO) 20 MG TABS tablet Take 20 mg by mouth daily.     No current facility-administered medications for this visit.     Allergies:   Cefdinir; Levaquin [levofloxacin in d5w]; and Morphine and related   Social History:  The patient  reports that she has been smoking.  She has never used smokeless tobacco. She reports that she does not drink alcohol or use drugs.   Family History:  The patient's family history includes CAD in her brother; Heart disease in her brother.    ROS:  Please see the history of present illness.   Otherwise, review of systems is positive  for none.   All other systems are reviewed and negative.   PHYSICAL EXAM: VS:  BP (!) 165/82   Pulse 60   Temp 97.6 F (36.4 C) (Oral)   Resp 18   Ht 5' (1.524 m)   Wt 128 lb (58.1 kg)   SpO2 98%   BMI 25.00 kg/m  , BMI Body mass index is 25 kg/m. GEN: Well nourished, well developed, in no acute distress  HEENT: normal  Neck: no JVD, carotid bruits, or masses Cardiac: RRR; no murmurs, rubs, or gallops,no edema  Respiratory:  clear to auscultation bilaterally, normal work of breathing GI: soft, nontender, nondistended, + BS MS: no deformity or atrophy  Skin: warm and dry Neuro:  Strength and sensation are intact Psych: euthymic mood, full affect  EKG:  EKG is not ordered today   ASSESSMENT AND PLAN:  1.  Atrial fibrillation/flutter: Presents today for ablation of atrial fib/flutter. Risks and benefits discussed. Risks include but not limited to bleeding, infection, tamponade, heart block, stroke, and damage to surrouding organs. She understands these risks and has agreed to the procedure.  2. Hypertensive heart disease with heart failure: No evidence of volume overload. Currently on a diuretic. Blood pressure is elevated today, but within normal limits at home. No changes today.  3. Hyperlipidemia: Continue statin  4. Coronary artery disease with stable angina: no chest pain currently, continue current management with Plavix, CCB, BB, and statin.

## 2017-05-29 NOTE — Progress Notes (Signed)
Site area: rt groin 2 fv sheaths Site Prior to Removal:  Level 0 Pressure Applied For: 20 minutes Manual:   yes Patient Status During Pull:  stable Post Pull Site:  Level 0 Post Pull Instructions Given:  yes Post Pull Pulses Present: palpable Dressing Applied:  Gauze and tegaderm Bedrest begins @  Comments:

## 2017-05-29 NOTE — Anesthesia Procedure Notes (Signed)
Procedure Name: Intubation Date/Time: 05/29/2017 10:45 AM Performed by: Sharee Holster Pre-anesthesia Checklist: Patient identified, Emergency Drugs available, Suction available and Patient being monitored Patient Re-evaluated:Patient Re-evaluated prior to induction Oxygen Delivery Method: Circle System Utilized Preoxygenation: Pre-oxygenation with 100% oxygen Induction Type: IV induction Ventilation: Mask ventilation without difficulty Tube type: Oral Tube size: 6.5 mm Number of attempts: 1 Airway Equipment and Method: Stylet (Blind Intubation) Placement Confirmation: ETT inserted through vocal cords under direct vision,  positive ETCO2 and breath sounds checked- equal and bilateral Secured at: 20 cm Tube secured with: Tape Dental Injury: Teeth and Oropharynx as per pre-operative assessment  Difficulty Due To: Difficulty was anticipated Future Recommendations: Recommend- induction with short-acting agent, and alternative techniques readily available Comments: Patient with history of traumatic intubation resulting in emergency tracheostomy. Elective glidescope and use planned and in room on standby, Dr. Jacklynn Bue successfully performed a blind intubation and glidescope not used.

## 2017-05-29 NOTE — Anesthesia Preprocedure Evaluation (Signed)
Anesthesia Evaluation  Patient identified by MRN, date of birth, ID band Patient awake  General Assessment Comment:Tracheal trauma ass c intubation leading to trach in past  Reviewed: Allergy & Precautions  History of Anesthesia Complications (+) DIFFICULT AIRWAYNegative for: history of anesthetic complications  Airway Mallampati: II  TM Distance: <3 FB Neck ROM: Full    Dental  (+) Teeth Intact   Pulmonary COPD, Current Smoker,    breath sounds clear to auscultation       Cardiovascular hypertension, + angina + CAD  + dysrhythmias  Rhythm:Regular Rate:Normal     Neuro/Psych    GI/Hepatic Neg liver ROS,   Endo/Other  negative endocrine ROS  Renal/GU negative Renal ROS     Musculoskeletal   Abdominal   Peds  Hematology negative hematology ROS (+)   Anesthesia Other Findings   Reproductive/Obstetrics                             Anesthesia Physical Anesthesia Plan  ASA: III  Anesthesia Plan: General   Post-op Pain Management:  Regional for Post-op pain   Induction: Intravenous  PONV Risk Score and Plan:   Airway Management Planned: Oral ETT  Additional Equipment:   Intra-op Plan:   Post-operative Plan: Extubation in OR  Informed Consent: I have reviewed the patients History and Physical, chart, labs and discussed the procedure including the risks, benefits and alternatives for the proposed anesthesia with the patient or authorized representative who has indicated his/her understanding and acceptance.   Dental advisory given  Plan Discussed with: CRNA  Anesthesia Plan Comments: (Small ET discussed and possible Glide use)        Anesthesia Quick Evaluation

## 2017-05-30 DIAGNOSIS — I480 Paroxysmal atrial fibrillation: Secondary | ICD-10-CM | POA: Diagnosis not present

## 2017-05-30 DIAGNOSIS — M1990 Unspecified osteoarthritis, unspecified site: Secondary | ICD-10-CM | POA: Diagnosis not present

## 2017-05-30 DIAGNOSIS — F17210 Nicotine dependence, cigarettes, uncomplicated: Secondary | ICD-10-CM | POA: Diagnosis not present

## 2017-05-30 DIAGNOSIS — I110 Hypertensive heart disease with heart failure: Secondary | ICD-10-CM | POA: Diagnosis not present

## 2017-05-30 DIAGNOSIS — I2510 Atherosclerotic heart disease of native coronary artery without angina pectoris: Secondary | ICD-10-CM | POA: Diagnosis not present

## 2017-05-30 DIAGNOSIS — Z79899 Other long term (current) drug therapy: Secondary | ICD-10-CM | POA: Diagnosis not present

## 2017-05-30 DIAGNOSIS — J449 Chronic obstructive pulmonary disease, unspecified: Secondary | ICD-10-CM | POA: Diagnosis not present

## 2017-05-30 DIAGNOSIS — Z7901 Long term (current) use of anticoagulants: Secondary | ICD-10-CM | POA: Diagnosis not present

## 2017-05-30 DIAGNOSIS — E785 Hyperlipidemia, unspecified: Secondary | ICD-10-CM | POA: Diagnosis not present

## 2017-05-30 DIAGNOSIS — I483 Typical atrial flutter: Secondary | ICD-10-CM | POA: Diagnosis not present

## 2017-05-30 DIAGNOSIS — Z7902 Long term (current) use of antithrombotics/antiplatelets: Secondary | ICD-10-CM | POA: Diagnosis not present

## 2017-05-30 DIAGNOSIS — I509 Heart failure, unspecified: Secondary | ICD-10-CM | POA: Diagnosis not present

## 2017-05-30 MED ORDER — AMIODARONE HCL 200 MG PO TABS: 200.0000 mg | ORAL_TABLET | Freq: Every day | ORAL | 3 refills | Status: DC

## 2017-05-30 MED ORDER — COLCHICINE 0.6 MG PO TABS: 0.6000 mg | ORAL_TABLET | Freq: Two times a day (BID) | ORAL | 0 refills | Status: DC

## 2017-06-01 ENCOUNTER — Telehealth: Payer: Self-pay | Admitting: Cardiology

## 2017-06-01 ENCOUNTER — Encounter (HOSPITAL_COMMUNITY): Payer: Self-pay | Admitting: Cardiology

## 2017-06-01 NOTE — Anesthesia Postprocedure Evaluation (Signed)
Anesthesia Post Note  Patient: Jennifer Martin  Procedure(s) Performed: Procedure(s) (LRB): Atrial Fibrillation Ablation (N/A)     Patient location during evaluation: PACU Anesthesia Type: General Level of consciousness: sedated and patient cooperative Pain management: pain level controlled Vital Signs Assessment: post-procedure vital signs reviewed and stable Respiratory status: spontaneous breathing Cardiovascular status: stable Anesthetic complications: no    Last Vitals:  Vitals:   05/30/17 0940 05/30/17 1000  BP: (!) 146/94   Pulse: 64 68  Resp:  19  Temp:    SpO2:  93%    Last Pain:  Vitals:   05/30/17 1000  TempSrc:   PainSc: 0-No pain                 Lewie Loron

## 2017-06-01 NOTE — Telephone Encounter (Signed)
New message      Pt c/o medication issue:  1. Name of Medication:  colchicine 2. How are you currently taking this medication (dosage and times per day)?  0.6mg  bid  3. Are you having a reaction (difficulty breathing--STAT)? no  4. What is your medication issue? Pt states this medication is causing her to have "terrible" diarrhea.  Please call

## 2017-06-01 NOTE — Telephone Encounter (Signed)
Follow up    Pt is calling to follow up on phone call earlier today. Please call.

## 2017-06-01 NOTE — Telephone Encounter (Signed)
Reviewed with DOD, Dr. Katrinka Blazing. Advised pt to stop Colchicine. She will continue to monitor and call office if CP restarts after stopping medication. She thanks me for calling and agreeable to plan.

## 2017-06-02 MED FILL — Lidocaine HCl Local Preservative Free (PF) Inj 1%: INTRAMUSCULAR | Qty: 30 | Status: AC

## 2017-06-04 ENCOUNTER — Telehealth: Payer: Self-pay | Admitting: Cardiology

## 2017-06-04 NOTE — Telephone Encounter (Signed)
New message      Pt had an ablation last Friday.  She is still having pain in her right leg--(burning, achy feeling).  Please call

## 2017-06-04 NOTE — Telephone Encounter (Addendum)
Pt reports her left groin fine/no pain. The right groin is achy and burning while laying the bed.  Site is tender where groin lump used to be. She states when she got home -- the top of leg at the groin area was tender, and she had a big knot "more like swelling" on Saturday and the right groin/leg was not bruised then.  Now she is bruised all over top of right leg -- bruising extends from right groin, down to almost the knee. Denies any drainage/heat to the groin site. Denies any visible swelling to right leg.  Advised patient to continue to monitor area and informed that what she has reported is normal post operatively.  She will call office back if bruising continues to spread further/swelling begins and/or pain/burning worsens. She thanks me for calling and is agreeable to plan.

## 2017-06-08 ENCOUNTER — Encounter (HOSPITAL_COMMUNITY): Payer: Self-pay | Admitting: Emergency Medicine

## 2017-06-08 ENCOUNTER — Emergency Department (HOSPITAL_COMMUNITY): Payer: PPO

## 2017-06-08 ENCOUNTER — Inpatient Hospital Stay (HOSPITAL_COMMUNITY)
Admission: EM | Admit: 2017-06-08 | Discharge: 2017-06-13 | DRG: 292 | Disposition: A | Discharge status: Home / Self Care | Payer: PPO | Attending: Internal Medicine | Admitting: Internal Medicine

## 2017-06-08 DIAGNOSIS — F17210 Nicotine dependence, cigarettes, uncomplicated: Secondary | ICD-10-CM | POA: Diagnosis not present

## 2017-06-08 DIAGNOSIS — Z8249 Family history of ischemic heart disease and other diseases of the circulatory system: Secondary | ICD-10-CM | POA: Diagnosis not present

## 2017-06-08 DIAGNOSIS — F419 Anxiety disorder, unspecified: Secondary | ICD-10-CM | POA: Diagnosis not present

## 2017-06-08 DIAGNOSIS — I5041 Acute combined systolic (congestive) and diastolic (congestive) heart failure: Secondary | ICD-10-CM | POA: Diagnosis not present

## 2017-06-08 DIAGNOSIS — I441 Atrioventricular block, second degree: Secondary | ICD-10-CM | POA: Diagnosis not present

## 2017-06-08 DIAGNOSIS — Z7951 Long term (current) use of inhaled steroids: Secondary | ICD-10-CM

## 2017-06-08 DIAGNOSIS — E876 Hypokalemia: Secondary | ICD-10-CM | POA: Diagnosis not present

## 2017-06-08 DIAGNOSIS — Q211 Atrial septal defect: Secondary | ICD-10-CM | POA: Diagnosis not present

## 2017-06-08 DIAGNOSIS — F329 Major depressive disorder, single episode, unspecified: Secondary | ICD-10-CM | POA: Diagnosis present

## 2017-06-08 DIAGNOSIS — R7989 Other specified abnormal findings of blood chemistry: Secondary | ICD-10-CM | POA: Diagnosis not present

## 2017-06-08 DIAGNOSIS — I447 Left bundle-branch block, unspecified: Secondary | ICD-10-CM | POA: Diagnosis present

## 2017-06-08 DIAGNOSIS — Z883 Allergy status to other anti-infective agents status: Secondary | ICD-10-CM

## 2017-06-08 DIAGNOSIS — E785 Hyperlipidemia, unspecified: Secondary | ICD-10-CM | POA: Diagnosis not present

## 2017-06-08 DIAGNOSIS — J810 Acute pulmonary edema: Secondary | ICD-10-CM | POA: Diagnosis not present

## 2017-06-08 DIAGNOSIS — R001 Bradycardia, unspecified: Secondary | ICD-10-CM | POA: Diagnosis not present

## 2017-06-08 DIAGNOSIS — I5033 Acute on chronic diastolic (congestive) heart failure: Secondary | ICD-10-CM | POA: Diagnosis present

## 2017-06-08 DIAGNOSIS — Z881 Allergy status to other antibiotic agents status: Secondary | ICD-10-CM

## 2017-06-08 DIAGNOSIS — I110 Hypertensive heart disease with heart failure: Principal | ICD-10-CM | POA: Diagnosis present

## 2017-06-08 DIAGNOSIS — R404 Transient alteration of awareness: Secondary | ICD-10-CM | POA: Diagnosis not present

## 2017-06-08 DIAGNOSIS — R531 Weakness: Secondary | ICD-10-CM | POA: Diagnosis not present

## 2017-06-08 DIAGNOSIS — I4892 Unspecified atrial flutter: Secondary | ICD-10-CM | POA: Diagnosis present

## 2017-06-08 DIAGNOSIS — I509 Heart failure, unspecified: Secondary | ICD-10-CM

## 2017-06-08 DIAGNOSIS — I480 Paroxysmal atrial fibrillation: Secondary | ICD-10-CM | POA: Diagnosis present

## 2017-06-08 DIAGNOSIS — T501X5A Adverse effect of loop [high-ceiling] diuretics, initial encounter: Secondary | ICD-10-CM | POA: Diagnosis present

## 2017-06-08 DIAGNOSIS — R0602 Shortness of breath: Secondary | ICD-10-CM

## 2017-06-08 DIAGNOSIS — I25118 Atherosclerotic heart disease of native coronary artery with other forms of angina pectoris: Secondary | ICD-10-CM | POA: Diagnosis not present

## 2017-06-08 DIAGNOSIS — Z885 Allergy status to narcotic agent status: Secondary | ICD-10-CM | POA: Diagnosis not present

## 2017-06-08 DIAGNOSIS — Z7901 Long term (current) use of anticoagulants: Secondary | ICD-10-CM

## 2017-06-08 DIAGNOSIS — J439 Emphysema, unspecified: Secondary | ICD-10-CM | POA: Diagnosis present

## 2017-06-08 DIAGNOSIS — I351 Nonrheumatic aortic (valve) insufficiency: Secondary | ICD-10-CM | POA: Diagnosis not present

## 2017-06-08 DIAGNOSIS — R55 Syncope and collapse: Secondary | ICD-10-CM | POA: Diagnosis not present

## 2017-06-08 DIAGNOSIS — Z7902 Long term (current) use of antithrombotics/antiplatelets: Secondary | ICD-10-CM | POA: Diagnosis not present

## 2017-06-08 DIAGNOSIS — I481 Persistent atrial fibrillation: Secondary | ICD-10-CM | POA: Diagnosis not present

## 2017-06-08 DIAGNOSIS — I340 Nonrheumatic mitral (valve) insufficiency: Secondary | ICD-10-CM | POA: Diagnosis not present

## 2017-06-08 DIAGNOSIS — I700 Atherosclerosis of aorta: Secondary | ICD-10-CM | POA: Diagnosis not present

## 2017-06-08 DIAGNOSIS — I420 Dilated cardiomyopathy: Secondary | ICD-10-CM | POA: Diagnosis not present

## 2017-06-08 DIAGNOSIS — I5043 Acute on chronic combined systolic (congestive) and diastolic (congestive) heart failure: Secondary | ICD-10-CM | POA: Diagnosis not present

## 2017-06-08 HISTORY — DX: Acute myocardial infarction, unspecified: I21.9

## 2017-06-08 HISTORY — DX: Anxiety disorder, unspecified: F41.9

## 2017-06-08 HISTORY — DX: Depression, unspecified: F32.A

## 2017-06-08 HISTORY — DX: Chronic obstructive pulmonary disease, unspecified: J44.9

## 2017-06-08 HISTORY — DX: Major depressive disorder, single episode, unspecified: F32.9

## 2017-06-08 LAB — BASIC METABOLIC PANEL
Anion gap: 11 (ref 5–15)
BUN: 8 mg/dL (ref 6–20)
CALCIUM: 8.7 mg/dL — AB (ref 8.9–10.3)
CO2: 26 mmol/L (ref 22–32)
CREATININE: 0.57 mg/dL (ref 0.44–1.00)
Chloride: 98 mmol/L — ABNORMAL LOW (ref 101–111)
GFR calc Af Amer: >60 mL/min (ref >60)
GFR calc non Af Amer: >60 mL/min (ref >60)
Glucose, Bld: 115 mg/dL — ABNORMAL HIGH (ref 65–99)
Potassium: 3.8 mmol/L (ref 3.5–5.1)
Sodium: 135 mmol/L (ref 135–145)

## 2017-06-08 LAB — CBC
HCT: 32.7 % — ABNORMAL LOW (ref 36.0–46.0)
Hemoglobin: 10.6 g/dL — ABNORMAL LOW (ref 12.0–15.0)
MCH: 29.1 pg (ref 26.0–34.0)
MCHC: 32.4 g/dL (ref 30.0–36.0)
MCV: 89.8 fL (ref 78.0–100.0)
PLATELETS: 348 10*3/uL (ref 150–400)
RBC: 3.64 MIL/uL — AB (ref 3.87–5.11)
RDW: 13.8 % (ref 11.5–15.5)
WBC: 9.6 10*3/uL (ref 4.0–10.5)

## 2017-06-08 LAB — I-STAT TROPONIN, ED: TROPONIN I, POC: 0.04 ng/mL (ref 0.00–0.08)

## 2017-06-08 LAB — CBG MONITORING, ED: GLUCOSE-CAPILLARY: 122 mg/dL — AB (ref 65–99)

## 2017-06-08 LAB — D-DIMER, QUANTITATIVE: D-Dimer, Quant: 1.45 ug/mL-FEU — ABNORMAL HIGH (ref 0.00–0.50)

## 2017-06-08 MED ORDER — IOPAMIDOL (ISOVUE-370) INJECTION 76%
INTRAVENOUS | Status: AC
  Administered 2017-06-09: 80 mL
  Filled 2017-06-08: qty 100

## 2017-06-08 NOTE — ED Notes (Signed)
Patient transported to X-ray 

## 2017-06-08 NOTE — ED Triage Notes (Signed)
Pt brought to ED by EMS from home for c/o near syncope, had an ablation last week on [facility], while taking a shower had a sudden onset of heat all over her body, weakness and SOB, neb treatment given by EMS and pt had some relief, SR on the monitor with 1 AVB VS BP 152/90, HR 60, R 20, SPO2 96% 2L [place], cbg 144.

## 2017-06-08 NOTE — ED Notes (Signed)
Main lab to add on BNP 

## 2017-06-08 NOTE — ED Notes (Signed)
Pt c.o coughing up a pink tinged sputum, EDP aware

## 2017-06-08 NOTE — ED Provider Notes (Signed)
TIME SEEN: 11:08 PM  By signing my name below, I, Clarisse Gouge, attest that this documentation has been prepared under the direction and in the presence of Ward, Layla Maw, DO. Electronically signed, Clarisse Gouge, ED Scribe. 06/08/17. 11:25 PM.   CHIEF COMPLAINT: Near syncope  HPI:  Jennifer Martin is a 61 y.o. female with h/o CAD, HTN, anxiety and depression presenting to the Emergency Department concerning a near syncopal episode that occurred this evening. Pt states she became short of breath after her shower before associated symptoms came on gradually. Pt states lightheadedness, a hot sensation to the ears, hands and "bottom", diarrhea that she last experienced 2 days ago, cough productive of pink tinged sputum and abdominal pain discomfort described as bloating. Ablation for Afib noted 05/29/2017 that was successful. Pt states her last catheterization was 15 years ago. Patient had a stress test at [facility] on April 9 that showed no ischemia and an EF of 61%. Pt's relative states the pt has had similar symptoms approximately 12 years ago in 2006 after a surgery. Patient had respiratory failure, double pneumonia, required intubation and ventilation and then subsequent tracheostomy. No supplemental oxygen use at home. Pt on plavix and xarelto. No h/o cardiac stents or syncope. No chest pain or pressure, palpitations, numbness, tingling, focal weakness or any other complaints noted at this time. No recent fevers, vomiting.  ROS: See HPI Constitutional: no fever  Eyes: no drainage  ENT: no runny nose   Cardiovascular:  no chest pain  Resp: + SOB GI: no vomiting GU: no dysuria Integumentary: no rash  Allergy: no hives  Musculoskeletal: no leg swelling  Neurological: no slurred speech ROS otherwise negative  PAST MEDICAL HISTORY/PAST SURGICAL HISTORY:  Past Medical History:  Diagnosis Date  . Atherosclerosis of coronary artery of native heart with angina pectoris (HCC)   . Atrial  flutter (HCC)   . CAD (coronary artery disease)   . Demand ischemia (HCC)   . Demand ischemia (HCC)   . Diverticulosis   . Hyperlipidemia   . Hypertension   . Inappropriate ADH syndrome (HCC)   . Inguinal hernia   . Migraine   . Osteoarthritis   . Pneumonia     MEDICATIONS:  Prior to Admission medications   Medication Sig Start Date End Date Taking? Authorizing Provider  acetaminophen (TYLENOL) 500 MG tablet Take 1,000 mg by mouth 2 (two) times daily as needed for mild pain or headache.    [provider]  albuterol (PROVENTIL HFA;VENTOLIN HFA) 108 (90 Base) MCG/ACT inhaler Inhale 2 puffs into the lungs every 6 (six) hours as needed for wheezing or shortness of breath.    [provider]  amiodarone (PACERONE) 200 MG tablet Take 1 tablet (200 mg total) by mouth daily. 05/30/17   Strader, Lennart Pall, PA-C  atorvastatin (LIPITOR) 20 MG tablet Take 10 mg by mouth daily.     [provider]  budesonide-formoterol (SYMBICORT) 160-4.5 MCG/ACT inhaler Inhale 2 puffs into the lungs 2 (two) times daily.    [provider]  carvedilol (COREG) 12.5 MG tablet Take 12.5 mg by mouth 2 (two) times daily with a meal.    [provider]  clopidogrel (PLAVIX) 75 MG tablet Take 75 mg by mouth daily.    [provider]  diltiazem (CARDIZEM CD) 120 MG 24 hr capsule Take 120 mg by mouth daily.    [provider]  escitalopram (LEXAPRO) 10 MG tablet Take 5 mg by mouth daily.  [provider]  fluticasone (FLONASE) 50 MCG/ACT nasal spray Place 2 sprays into both nostrils daily as needed for allergies.     [provider]  furosemide (LASIX) 20 MG tablet Take 20 mg by mouth every other day. FOR WEIGHT 131 OR GREATER     [provider]  hydrALAZINE (APRESOLINE) 25 MG tablet Take 0.5 tablets (12.5 mg total) by mouth 2 (two) times daily. 04/20/17 06/04/17  Baldo Daub, MD  LORazepam (ATIVAN) 1 MG tablet Take 1 mg by mouth  every 8 (eight) hours as needed for anxiety.     [provider]  nitroGLYCERIN (NITROSTAT) 0.4 MG SL tablet Place 0.4 mg under the tongue every 5 (five) minutes as needed for chest pain.    [provider]  Omega-3 Fatty Acids (OMEGA-3 FISH OIL) 300 MG CAPS Take 300 mg by mouth daily.     [provider]  pantoprazole (PROTONIX) 40 MG tablet Take 40 mg by mouth daily.    [provider]  potassium chloride SA (K-DUR,KLOR-CON) 20 MEQ tablet Take 20 mEq by mouth daily as needed. IF YOU TAKE FUROSEMIDE    [provider]  rivaroxaban (XARELTO) 20 MG TABS tablet Take 1 tablet (20 mg total) by mouth daily. 04/20/17   Baldo Daub, MD    ALLERGIES:  Allergies  Allergen Reactions  . Cefdinir Diarrhea  . Levaquin [Levofloxacin In D5w] Diarrhea  . Morphine And Related Nausea Only    SOCIAL HISTORY:  Social History  Substance Use Topics  . Smoking status: Current Every Day Smoker  . Smokeless tobacco: Never Used  . Alcohol use No    FAMILY HISTORY: Family History  Problem Relation Age of Onset  . CAD Brother   . Heart disease Brother     EXAM: BP (!) 159/88   Pulse (!) 59   Temp 98.2 F (36.8 C) (Oral)   Resp 17   Ht 5' (1.524 m)   Wt 132 lb (59.9 kg)   SpO2 94%   BMI 25.78 kg/m  CONSTITUTIONAL: Alert and oriented and responds appropriately to questions. Well-appearing; well-nourished HEAD: Normocephalic EYES: Conjunctivae clear, pupils appear equal, EOMI ENT: normal nose; moist mucous membranes NECK: Supple, no meningismus, no nuchal rigidity, no LAD  CARD: RRR; S1 and S2 appreciated; no murmurs, no clicks, no rubs, no gallops RESP: Normal chest excursion without splinting or tachypnea; breath sounds clear and equal bilaterally; no wheezes, no rhonchi, no rales, no respiratory distress, speaking full sentences; patient's oxygen saturations in the low 90s on room air ABD/GI: Normal bowel sounds; non-distended; soft, non-tender, no  rebound, no guarding, no peritoneal signs, no hepatosplenomegaly BACK:  The back appears normal and is non-tender to palpation, there is no CVA tenderness EXT: Normal ROM in all joints; non-tender to palpation; no edema; normal capillary refill; no cyanosis, no calf tenderness or swelling    SKIN: Normal color for age and race; warm; no rash NEURO: Moves all extremities equally; Strength 5/5 in all four extremities.  Normal sensation diffusely.  CN 2-12 grossly intact.  Normal speech.  Normal gait. PSYCH: The patient's mood and manner are appropriate. Grooming and personal hygiene are appropriate.  MEDICAL DECISION MAKING: Patient here with near syncopal event and shortness of breath. Has had some pink tinged sputum recently. Daughter reports similar episode approximately 12 years ago after a surgery. Patient reports after the surgery that her airway was "scratched" and she developed "double pneumonia".  She required intubation, ventilation and subsequent  tracheostomy. She denies chest pain or chest discomfort. No headache, focal neurologic deficits during this episode. On exam, patient does have slightly low oxygen saturation on room air. She is not chronically wear oxygen. Her lungs are currently clear. Differential includes arrhythmia, ACS, PE. Doubt stroke, intracranial hemorrhage. Will obtain labs including troponin and d-dimer, urine, chest x-ray. EKG shows left bundle branch block with no new ischemic abnormality, interval changes or new arrhythmia.  ED PROGRESS: Patient's EKG shows left bundle branch block with no change compared to previous. She has not had any arrhythmia here. Troponin negative. Electrodes normal. Urine shows no sign of infection or dehydration. Chest x-ray concerning for pulmonary edema and BNP is elevated. D-dimer also elevated. Will proceed with CT scan to rule out PE.   CT scan shows no pulmonary or spit does confirm pulmonary edema. We'll give IV Lasix 40 mg. She reports  she is on Lasix at home. We'll discuss with hospitalist for admission for IV diuresis.    2:18 AM Discussed patient's case with hospitalist, Dr. Julian Reil.  I have recommended admission and patient (and family if present) agree with this plan. Admitting physician will place admission orders.   I reviewed all nursing notes, vitals, pertinent previous records, EKGs, lab and urine results, imaging (as available).      EKG Interpretation  Date/Time:  Monday June 08 2017 22:42:33 EDT Ventricular Rate:  61 PR Interval:    QRS Duration: 151 QT Interval:  513 QTC Calculation: 517 R Axis:   -53 Text Interpretation:  Sinus or ectopic atrial rhythm Prolonged PR interval Left bundle branch block Baseline wander in lead(s) I II aVR No significant change since last tracing Confirmed by Rochele Raring [phone]) on 06/08/2017 11:00:14 PM        I personally performed the services described in this documentation, which was scribed in my presence. The recorded information has been reviewed and is accurate.      Ward, Layla Maw, DO 06/09/17 [phone]

## 2017-06-09 ENCOUNTER — Encounter (HOSPITAL_COMMUNITY): Payer: Self-pay | Admitting: General Practice

## 2017-06-09 ENCOUNTER — Emergency Department (HOSPITAL_COMMUNITY): Payer: PPO

## 2017-06-09 DIAGNOSIS — I447 Left bundle-branch block, unspecified: Secondary | ICD-10-CM | POA: Diagnosis present

## 2017-06-09 DIAGNOSIS — R0602 Shortness of breath: Secondary | ICD-10-CM | POA: Diagnosis not present

## 2017-06-09 DIAGNOSIS — I110 Hypertensive heart disease with heart failure: Secondary | ICD-10-CM | POA: Diagnosis not present

## 2017-06-09 DIAGNOSIS — I480 Paroxysmal atrial fibrillation: Secondary | ICD-10-CM

## 2017-06-09 DIAGNOSIS — F329 Major depressive disorder, single episode, unspecified: Secondary | ICD-10-CM | POA: Diagnosis present

## 2017-06-09 DIAGNOSIS — I420 Dilated cardiomyopathy: Secondary | ICD-10-CM | POA: Diagnosis not present

## 2017-06-09 DIAGNOSIS — I481 Persistent atrial fibrillation: Secondary | ICD-10-CM | POA: Diagnosis not present

## 2017-06-09 DIAGNOSIS — R55 Syncope and collapse: Secondary | ICD-10-CM

## 2017-06-09 DIAGNOSIS — I5033 Acute on chronic diastolic (congestive) heart failure: Secondary | ICD-10-CM | POA: Diagnosis present

## 2017-06-09 DIAGNOSIS — E785 Hyperlipidemia, unspecified: Secondary | ICD-10-CM | POA: Diagnosis present

## 2017-06-09 DIAGNOSIS — Z7951 Long term (current) use of inhaled steroids: Secondary | ICD-10-CM | POA: Diagnosis not present

## 2017-06-09 DIAGNOSIS — I25118 Atherosclerotic heart disease of native coronary artery with other forms of angina pectoris: Secondary | ICD-10-CM | POA: Diagnosis present

## 2017-06-09 DIAGNOSIS — I351 Nonrheumatic aortic (valve) insufficiency: Secondary | ICD-10-CM | POA: Diagnosis not present

## 2017-06-09 DIAGNOSIS — Z885 Allergy status to narcotic agent status: Secondary | ICD-10-CM | POA: Diagnosis not present

## 2017-06-09 DIAGNOSIS — Z881 Allergy status to other antibiotic agents status: Secondary | ICD-10-CM | POA: Diagnosis not present

## 2017-06-09 DIAGNOSIS — Z8249 Family history of ischemic heart disease and other diseases of the circulatory system: Secondary | ICD-10-CM | POA: Diagnosis not present

## 2017-06-09 DIAGNOSIS — E876 Hypokalemia: Secondary | ICD-10-CM | POA: Diagnosis not present

## 2017-06-09 DIAGNOSIS — I700 Atherosclerosis of aorta: Secondary | ICD-10-CM | POA: Diagnosis not present

## 2017-06-09 DIAGNOSIS — I5041 Acute combined systolic (congestive) and diastolic (congestive) heart failure: Secondary | ICD-10-CM | POA: Diagnosis not present

## 2017-06-09 DIAGNOSIS — J439 Emphysema, unspecified: Secondary | ICD-10-CM | POA: Diagnosis not present

## 2017-06-09 DIAGNOSIS — R7989 Other specified abnormal findings of blood chemistry: Secondary | ICD-10-CM | POA: Diagnosis not present

## 2017-06-09 DIAGNOSIS — R001 Bradycardia, unspecified: Secondary | ICD-10-CM | POA: Diagnosis not present

## 2017-06-09 DIAGNOSIS — Z7902 Long term (current) use of antithrombotics/antiplatelets: Secondary | ICD-10-CM | POA: Diagnosis not present

## 2017-06-09 DIAGNOSIS — I441 Atrioventricular block, second degree: Secondary | ICD-10-CM | POA: Diagnosis not present

## 2017-06-09 DIAGNOSIS — I5043 Acute on chronic combined systolic (congestive) and diastolic (congestive) heart failure: Secondary | ICD-10-CM | POA: Diagnosis present

## 2017-06-09 DIAGNOSIS — Z883 Allergy status to other anti-infective agents status: Secondary | ICD-10-CM | POA: Diagnosis not present

## 2017-06-09 DIAGNOSIS — J810 Acute pulmonary edema: Secondary | ICD-10-CM | POA: Diagnosis not present

## 2017-06-09 DIAGNOSIS — F17210 Nicotine dependence, cigarettes, uncomplicated: Secondary | ICD-10-CM | POA: Diagnosis present

## 2017-06-09 DIAGNOSIS — Z7901 Long term (current) use of anticoagulants: Secondary | ICD-10-CM | POA: Diagnosis not present

## 2017-06-09 DIAGNOSIS — Q211 Atrial septal defect: Secondary | ICD-10-CM | POA: Diagnosis not present

## 2017-06-09 DIAGNOSIS — I340 Nonrheumatic mitral (valve) insufficiency: Secondary | ICD-10-CM | POA: Diagnosis not present

## 2017-06-09 DIAGNOSIS — F419 Anxiety disorder, unspecified: Secondary | ICD-10-CM | POA: Diagnosis present

## 2017-06-09 DIAGNOSIS — T501X5A Adverse effect of loop [high-ceiling] diuretics, initial encounter: Secondary | ICD-10-CM | POA: Diagnosis present

## 2017-06-09 DIAGNOSIS — I509 Heart failure, unspecified: Secondary | ICD-10-CM

## 2017-06-09 DIAGNOSIS — I4892 Unspecified atrial flutter: Secondary | ICD-10-CM | POA: Diagnosis present

## 2017-06-09 LAB — HIV ANTIBODY (ROUTINE TESTING W REFLEX): HIV SCREEN 4TH GENERATION: NONREACTIVE

## 2017-06-09 LAB — URINALYSIS, ROUTINE W REFLEX MICROSCOPIC
BILIRUBIN URINE: NEGATIVE
Glucose, UA: NEGATIVE mg/dL
Hgb urine dipstick: NEGATIVE
KETONES UR: NEGATIVE mg/dL
Leukocytes, UA: NEGATIVE
NITRITE: NEGATIVE
PH: 7 (ref 5.0–8.0)
Protein, ur: NEGATIVE mg/dL
Specific Gravity, Urine: 1.004 — ABNORMAL LOW (ref 1.005–1.030)

## 2017-06-09 LAB — BRAIN NATRIURETIC PEPTIDE: B Natriuretic Peptide: 739.2 pg/mL — ABNORMAL HIGH (ref 0.0–100.0)

## 2017-06-09 MED ORDER — LISINOPRIL 2.5 MG PO TABS
2.5000 mg | ORAL_TABLET | Freq: Every day | ORAL | Status: DC
  Administered 2017-06-09 – 2017-06-11 (×3): 2.5 mg via ORAL
  Filled 2017-06-09 (×3): qty 1

## 2017-06-09 MED ORDER — POTASSIUM CHLORIDE CRYS ER 20 MEQ PO TBCR
20.0000 meq | EXTENDED_RELEASE_TABLET | ORAL | Status: DC
  Administered 2017-06-09: 20 meq via ORAL
  Filled 2017-06-09 (×2): qty 1

## 2017-06-09 MED ORDER — FUROSEMIDE 10 MG/ML IJ SOLN
40.0000 mg | Freq: Every day | INTRAMUSCULAR | Status: DC
  Administered 2017-06-09 – 2017-06-10 (×2): 40 mg via INTRAVENOUS
  Filled 2017-06-09 (×2): qty 4

## 2017-06-09 MED ORDER — SODIUM CHLORIDE 0.9 % IV SOLN: 250.0000 mL | INTRAVENOUS | Status: DC | PRN

## 2017-06-09 MED ORDER — LORAZEPAM 1 MG PO TABS
1.0000 mg | ORAL_TABLET | Freq: Three times a day (TID) | ORAL | Status: DC | PRN
  Administered 2017-06-09 – 2017-06-12 (×8): 1 mg via ORAL
  Filled 2017-06-09 (×8): qty 1

## 2017-06-09 MED ORDER — DILTIAZEM HCL ER COATED BEADS 120 MG PO CP24
120.0000 mg | ORAL_CAPSULE | Freq: Every day | ORAL | Status: DC
  Administered 2017-06-09 – 2017-06-11 (×3): 120 mg via ORAL
  Filled 2017-06-09 (×3): qty 1

## 2017-06-09 MED ORDER — ACETAMINOPHEN 500 MG PO TABS: 1000.0000 mg | ORAL_TABLET | Freq: Two times a day (BID) | ORAL | Status: DC | PRN

## 2017-06-09 MED ORDER — ONDANSETRON HCL 4 MG/2ML IJ SOLN: 4.0000 mg | Freq: Four times a day (QID) | INTRAMUSCULAR | Status: DC | PRN

## 2017-06-09 MED ORDER — AMIODARONE HCL 200 MG PO TABS
200.0000 mg | ORAL_TABLET | Freq: Every day | ORAL | Status: DC
  Administered 2017-06-09 – 2017-06-13 (×5): 200 mg via ORAL
  Filled 2017-06-09 (×5): qty 1

## 2017-06-09 MED ORDER — FUROSEMIDE 10 MG/ML IJ SOLN
40.0000 mg | Freq: Once | INTRAMUSCULAR | Status: AC
  Administered 2017-06-09: 40 mg via INTRAVENOUS
  Filled 2017-06-09: qty 4

## 2017-06-09 MED ORDER — ACETAMINOPHEN 325 MG PO TABS
650.0000 mg | ORAL_TABLET | ORAL | Status: DC | PRN
  Administered 2017-06-09 – 2017-06-12 (×3): 650 mg via ORAL
  Filled 2017-06-09 (×3): qty 2

## 2017-06-09 MED ORDER — SODIUM CHLORIDE 0.9% FLUSH: 3.0000 mL | INTRAVENOUS | Status: DC | PRN

## 2017-06-09 MED ORDER — ALBUTEROL SULFATE (2.5 MG/3ML) 0.083% IN NEBU: 2.5000 mg | INHALATION_SOLUTION | Freq: Four times a day (QID) | RESPIRATORY_TRACT | Status: DC | PRN

## 2017-06-09 MED ORDER — CLOPIDOGREL BISULFATE 75 MG PO TABS
75.0000 mg | ORAL_TABLET | Freq: Every day | ORAL | Status: DC
  Administered 2017-06-09 – 2017-06-11 (×3): 75 mg via ORAL
  Filled 2017-06-09 (×4): qty 1

## 2017-06-09 MED ORDER — FLUTICASONE PROPIONATE 50 MCG/ACT NA SUSP: 2.0000 | Freq: Every day | NASAL | Status: DC | PRN

## 2017-06-09 MED ORDER — HYDRALAZINE HCL 25 MG PO TABS
12.5000 mg | ORAL_TABLET | Freq: Two times a day (BID) | ORAL | Status: DC
  Administered 2017-06-09 – 2017-06-11 (×5): 12.5 mg via ORAL
  Filled 2017-06-09 (×5): qty 1

## 2017-06-09 MED ORDER — CARVEDILOL 12.5 MG PO TABS
12.5000 mg | ORAL_TABLET | Freq: Two times a day (BID) | ORAL | Status: DC
  Administered 2017-06-09 – 2017-06-10 (×4): 12.5 mg via ORAL
  Filled 2017-06-09 (×5): qty 1

## 2017-06-09 MED ORDER — ESCITALOPRAM OXALATE 10 MG PO TABS
5.0000 mg | ORAL_TABLET | Freq: Every day | ORAL | Status: DC
  Administered 2017-06-09 – 2017-06-13 (×5): 5 mg via ORAL
  Filled 2017-06-09 (×5): qty 1

## 2017-06-09 MED ORDER — SODIUM CHLORIDE 0.9% FLUSH
3.0000 mL | Freq: Two times a day (BID) | INTRAVENOUS | Status: DC
  Administered 2017-06-09 – 2017-06-12 (×8): 3 mL via INTRAVENOUS

## 2017-06-09 MED ORDER — MOMETASONE FURO-FORMOTEROL FUM 200-5 MCG/ACT IN AERO
2.0000 | INHALATION_SPRAY | Freq: Two times a day (BID) | RESPIRATORY_TRACT | Status: DC
  Administered 2017-06-09 – 2017-06-12 (×8): 2 via RESPIRATORY_TRACT
  Filled 2017-06-09 (×2): qty 8.8

## 2017-06-09 MED ORDER — RIVAROXABAN 20 MG PO TABS
20.0000 mg | ORAL_TABLET | Freq: Every day | ORAL | Status: DC
  Administered 2017-06-09 – 2017-06-12 (×4): 20 mg via ORAL
  Filled 2017-06-09 (×4): qty 1

## 2017-06-09 MED ORDER — PANTOPRAZOLE SODIUM 40 MG PO TBEC
40.0000 mg | DELAYED_RELEASE_TABLET | Freq: Every day | ORAL | Status: DC
  Administered 2017-06-09 – 2017-06-13 (×5): 40 mg via ORAL
  Filled 2017-06-09 (×5): qty 1

## 2017-06-09 NOTE — Progress Notes (Signed)
Pt's o2 sat on 95%  room air. Pt states she does not feel like she needs the O2 anymore so will keep her on room air and continue to monitor her.

## 2017-06-09 NOTE — ED Notes (Signed)
Admitting provider bedside 

## 2017-06-09 NOTE — Discharge Instructions (Signed)

## 2017-06-09 NOTE — ED Notes (Signed)
Unable to get records from [facility] since theirs only go back to 2006.  Perhaps someone will have more luck when Medical records opens in the morning. Dr. Elesa Massed wanted records from [place] regarding the visit when patient received trach.

## 2017-06-09 NOTE — H&P (Addendum)
History and Physical    Jennifer Martin ZOX:[id number] DOB: 11-20-1955 DOA: 06/08/2017  PCP: [name]., MD  Patient coming from: Home  I have personally briefly reviewed patient's old medical records in [facility] Link  Chief Complaint: SOB  HPI: Jennifer Martin is a 61 y.o. female with medical history significant of diastolic hypertensive CHF, A.Fib, just underwent ablation procedure x 10 days ago.  This successfully converted her to NSR.  Of note they did have to ablate around all 4 pulmonary veins during that procedure according to discharge summary.  Patient presents to the ED with c/o SOB and near syncope.  2 day history of cough productive of pink tinged sputum.  Abdominal discomfort.  No weight gain nor peripheral edema.   ED Course: CXR shows pulm edema, CTA chest shows no PE but also shows pulm edema.   Review of Systems: As per HPI otherwise 10 point review of systems negative.   Past Medical History:  Diagnosis Date  . Atherosclerosis of coronary artery of native heart with angina pectoris (HCC)   . Atrial flutter (HCC)   . CAD (coronary artery disease)   . Demand ischemia (HCC)   . Demand ischemia (HCC)   . Diverticulosis   . Hyperlipidemia   . Hypertension   . Inappropriate ADH syndrome (HCC)   . Inguinal hernia   . Migraine   . Osteoarthritis   . Pneumonia     Past Surgical History:  Procedure Laterality Date  . ABDOMINAL HYSTERECTOMY    . ATRIAL FIBRILLATION ABLATION N/A 05/29/2017   Procedure: Atrial Fibrillation Ablation;  Surgeon: Regan Lemming, MD;  Location: [facility] INVASIVE CV LAB;  Service: Cardiovascular;  Laterality: N/A;  . BREAST LUMPECTOMY    . SHOULDER SURGERY    . TRACHEOSTOMY       reports that she has been smoking.  She has never used smokeless tobacco. She reports that she does not drink alcohol or use drugs.  Allergies  Allergen Reactions  . Cefdinir Diarrhea  . Levaquin [Levofloxacin In D5w] Diarrhea  . Morphine And Related  Nausea Only    Family History  Problem Relation Age of Onset  . CAD Brother   . Heart disease Brother      Prior to Admission medications   Medication Sig Start Date End Date Taking? Authorizing Provider  acetaminophen (TYLENOL) 500 MG tablet Take 1,000 mg by mouth 2 (two) times daily as needed for mild pain or headache.   Yes [provider]  albuterol (PROVENTIL HFA;VENTOLIN HFA) 108 (90 Base) MCG/ACT inhaler Inhale 2 puffs into the lungs every 6 (six) hours as needed for wheezing or shortness of breath.   Yes [provider]  amiodarone (PACERONE) 200 MG tablet Take 1 tablet (200 mg total) by mouth daily. 05/30/17  Yes Strader, [place] M, PA-C  budesonide-formoterol (SYMBICORT) 160-4.5 MCG/ACT inhaler Inhale 2 puffs into the lungs 2 (two) times daily.   Yes [provider]  carvedilol (COREG) 12.5 MG tablet Take 12.5 mg by mouth 2 (two) times daily with a meal.   Yes [provider]  clopidogrel (PLAVIX) 75 MG tablet Take 75 mg by mouth daily.   Yes [provider]  diltiazem (CARDIZEM CD) 120 MG 24 hr capsule Take 120 mg by mouth daily.   Yes [provider]  escitalopram (LEXAPRO) 10 MG tablet Take 5 mg by mouth daily.   Yes [provider]  furosemide (LASIX) 20 MG tablet Take 20 mg by  mouth every other day. FOR WEIGHT 131 OR GREATER    Yes [provider]  hydrALAZINE (APRESOLINE) 25 MG tablet Take 0.5 tablets (12.5 mg total) by mouth 2 (two) times daily. 04/20/17 06/09/17 Yes Baldo Daub, MD  LORazepam (ATIVAN) 1 MG tablet Take 1 mg by mouth every 8 (eight) hours as needed for anxiety.    Yes [provider]  nitroGLYCERIN (NITROSTAT) 0.4 MG SL tablet Place 0.4 mg under the tongue every 5 (five) minutes as needed for chest pain.   Yes [provider]  pantoprazole (PROTONIX) 40 MG tablet Take 40 mg by mouth daily.   Yes [provider]  potassium chloride SA (K-DUR,KLOR-CON) 20 MEQ  tablet Take 20 mEq by mouth every other day.    Yes [provider]  rivaroxaban (XARELTO) 20 MG TABS tablet Take 1 tablet (20 mg total) by mouth daily. 04/20/17  Yes Baldo Daub, MD  fluticasone (FLONASE) 50 MCG/ACT nasal spray Place 2 sprays into both nostrils daily as needed for allergies.     [provider]    Physical Exam: Vitals:   06/09/17 0247 06/09/17 0300 06/09/17 0330 06/09/17 0400  BP: (!) 165/90 (!) 167/92 (!) 147/97 (!) 154/96  Pulse: 62 66 62 (!) 59  Resp: 19 (!) 26 15 19   Temp:      TempSrc:      SpO2: 91%  94% 97%  Weight:      Height:        Constitutional: NAD, calm, comfortable Eyes: PERRL, lids and conjunctivae normal ENMT: Mucous membranes are moist. Posterior pharynx clear of any exudate or lesions.Normal dentition.  Neck: normal, supple, no masses, no thyromegaly Respiratory: clear to auscultation bilaterally, no wheezing, no crackles. Normal respiratory effort. No accessory muscle use.  Cardiovascular: Regular rate and rhythm, no murmurs / rubs / gallops. No extremity edema. 2+ pedal pulses. No carotid bruits.  Abdomen: no tenderness, no masses palpated. No hepatosplenomegaly. Bowel sounds positive.  Musculoskeletal: no clubbing / cyanosis. No joint deformity upper and lower extremities. Good ROM, no contractures. Normal muscle tone.  Skin: no rashes, lesions, ulcers. No induration Neurologic: CN 2-12 grossly intact. Sensation intact, DTR normal. Strength 5/5 in all 4.  Psychiatric: Normal judgment and insight. Alert and oriented x 3. Normal mood.    Labs on Admission: I have personally reviewed following labs and imaging studies  CBC:  Recent Labs Lab 06/08/17 2252  WBC 9.6  HGB 10.6*  HCT 32.7*  MCV 89.8  PLT 348   Basic Metabolic Panel:  Recent Labs Lab 06/08/17 2252  NA 135  K 3.8  CL 98*  CO2 26  GLUCOSE 115*  BUN 8  CREATININE 0.57  CALCIUM 8.7*   GFR: Estimated Creatinine Clearance: 59 mL/min (by C-G  formula based on SCr of 0.57 mg/dL). Liver Function Tests: No results for input(s): AST, ALT, ALKPHOS, BILITOT, PROT, ALBUMIN in the last 168 hours. No results for input(s): LIPASE, AMYLASE in the last 168 hours. No results for input(s): AMMONIA in the last 168 hours. Coagulation Profile: No results for input(s): INR, PROTIME in the last 168 hours. Cardiac Enzymes: No results for input(s): CKTOTAL, CKMB, CKMBINDEX, TROPONINI in the last 168 hours. BNP (last 3 results)  Recent Labs  04/20/17 1133  PROBNP 795*   HbA1C: No results for input(s): HGBA1C in the last 72 hours. CBG:  Recent Labs Lab 06/08/17 2248  GLUCAP 122*   Lipid Profile: No results for input(s): CHOL, HDL, LDLCALC, TRIG,  CHOLHDL, LDLDIRECT in the last 72 hours. Thyroid Function Tests: No results for input(s): TSH, T4TOTAL, FREET4, T3FREE, THYROIDAB in the last 72 hours. Anemia Panel: No results for input(s): VITAMINB12, FOLATE, FERRITIN, TIBC, IRON, RETICCTPCT in the last 72 hours. Urine analysis:    Component Value Date/Time   COLORURINE STRAW (A) 06/08/2017 2344   APPEARANCEUR CLEAR 06/08/2017 2344   LABSPEC 1.004 (L) 06/08/2017 2344   PHURINE 7.0 06/08/2017 2344   GLUCOSEU NEGATIVE 06/08/2017 2344   HGBUR NEGATIVE 06/08/2017 2344   BILIRUBINUR NEGATIVE 06/08/2017 2344   KETONESUR NEGATIVE 06/08/2017 2344   PROTEINUR NEGATIVE 06/08/2017 2344   NITRITE NEGATIVE 06/08/2017 2344   LEUKOCYTESUR NEGATIVE 06/08/2017 2344    Radiological Exams on Admission: Dg Chest 2 View  Result Date: 06/08/2017 CLINICAL DATA:  Shortness of breath.  Near syncope. EXAM: CHEST  2 VIEW COMPARISON:  Limited CT chest May 27, 2017 FINDINGS: The cardiac silhouette is moderately enlarged, mediastinal silhouette is nonsuspicious. Diffuse interstitial prominence with Kerley B-lines. No pleural effusion or focal consolidation. No pneumothorax. Soft tissue planes and included osseous structures are nonsuspicious. IMPRESSION:  Moderate cardiomegaly. Interstitial prominence most consistent with pulmonary edema. Electronically Signed   By: Awilda Metro M.D.   On: 06/08/2017 23:42   Ct Angio Chest Pe W And/or Wo Contrast  Result Date: 06/09/2017 CLINICAL DATA:  Shortness of breath and near syncope. Pulmonary embolus suspected, intermediate probability. Elevated D-dimer. Recent atrial fibrillation ablation. EXAM: CT ANGIOGRAPHY CHEST WITH CONTRAST TECHNIQUE: Multidetector CT imaging of the chest was performed using the standard protocol during bolus administration of intravenous contrast. Multiplanar CT image reconstructions and MIPs were obtained to evaluate the vascular anatomy. CONTRAST:  80 cc Isovue 370 IV COMPARISON:  Chest radiograph earlier this day.  Chest CT 05/27/2017 FINDINGS: Cardiovascular: There are no filling defects within the pulmonary arteries to suggest pulmonary embolus. Atherosclerosis of the thoracic aorta. Coronary artery calcifications. The heart is enlarged with right and left heart dilatation. Mediastinum/Nodes: No mediastinal or hilar adenopathy. The esophagus is decompressed. No pericardial effusion. Visualized thyroid gland normal. Lungs/Pleura: Smooth septal thickening consistent with pulmonary edema, most prominent in the lower lobes. Superimposed lower lobe linear atelectasis. Unchanged smoothly marginated 9 mm right lower lobe pulmonary nodule abutting the major fissure. No new pulmonary nodule. Mild underlying emphysema and central bronchial thickening. No pleural fluid. Upper Abdomen: No acute abnormality. Musculoskeletal: There are no acute or suspicious osseous abnormalities. Review of the MIP images confirms the above findings. IMPRESSION: 1. No pulmonary embolus. 2. Cardiomegaly with pulmonary edema. 3. Emphysema with central bronchial thickening. 4. Aortic Atherosclerosis (ICD10-I70.0). Coronary artery calcifications. 5. Unchanged smoothly marginated 9 mm fissure based nodule in the superior  segment of the right lower lobe, possible intrapulmonary lymph node. Follow-up recommendations as before. Electronically Signed   By: Rubye Oaks M.D.   On: 06/09/2017 01:14    EKG: Independently reviewed.  Assessment/Plan Principal Problem:   Acute on chronic diastolic (congestive) heart failure (HCC) Active Problems:   Hypertensive heart disease with heart failure (HCC)   AF (atrial fibrillation) (HCC)    1. Acute on chronic diastolic CHF - 1. CHF pathway 2. Lasix 40 IV daily 3. Will add lisinopril 2.5mg  daily 4. Spoke with Dr. Shirlee Latch 1. 2D echo ordered for now 2. May need cardiac gated CT in a couple of days to r/o pulmonary vein stenosis with recent procedure 2. HTN -  1. Continue home meds 3. A.Fib - 1. Continue Xarelto 2. Continue home meds  DVT prophylaxis: Xarelto  Code Status: Full Family Communication: No family in room Disposition Plan: Home after admit Consults called: Curbsided Dr. Shirlee Latch Admission status: Place in [place], [place]. DO [facility] Pager [phone]  If 7AM-7PM, please contact day team taking care of patient [email or web address] Password [facility]  06/09/2017, 4:24 AM

## 2017-06-09 NOTE — Care Management Note (Signed)
Case Management Note  Patient Details  Name: Jennifer Martin MRN: [id number] Date of Birth: [birth year]  Subjective/Objective:       CHF            Action/Plan: Patient lives at home; PCP: [name]., MD; has private insurance with [employer] with prescription drug coverage; CM will continue to follow for DCP.  Expected Discharge Date:   possibly 06/12/2017               Expected Discharge Plan:  Home/Self Care  In-House Referral:   [facility]  Discharge planning Services  CM Consult  Status of Service:  In process, will continue to follow  Reola Mosher [phone] 06/09/2017, 1:43 PM

## 2017-06-09 NOTE — ED Notes (Signed)
Breakfast delivered to patient. 

## 2017-06-09 NOTE — Progress Notes (Signed)
When pt came from ED, Right Peripheral IV was bleeding. Took it out and put pressure dressing on, but there is still some bleeding. Changed pressure dressing and will continue to monitor.

## 2017-06-09 NOTE — ED Notes (Signed)
Pt O2 level remained around 89%, placed on 2L O2 [place]

## 2017-06-09 NOTE — Consult Note (Signed)
   [facility] CM Inpatient Consult   06/09/2017  SISTER CARBONE [birth year] [id number]  Patient assessed for [place] Management needs.  Patient is in the [place].  Patient admitted with acute on chronic diastolic hypertensive HF, Atrial Fib and had recently undergone an ablation. Met with the patient at the bedside.  Patient states she lives in [place] but has a [place] P[address] Box for her mail. She endorses Dr. Gilford Rile at [facility], as her primary care provider.  Patient denies any issues with transportation for  follow up medical appointments, she states she drives.  She does have some concerns about her medications and being in the donut hole with them.  She intends to follow up with her primary pharmacist about issues.  Encouraged her to call the concierge with [employer] for insurance questions.  She declines [facility] Care Management services states she has no needs at this time. Patient accepted a brochure with 24 hour nurse advise line magnet with contact information.  For questions, please contact:  Natividad Brood, RN BSN [facility] Liaison  [phone] business mobile phone Toll free office [phone]

## 2017-06-09 NOTE — Progress Notes (Signed)
Jennifer Martin is a 61 y.o. female with medical history significant of diastolic hypertensive CHF, A.Fib, just underwent ablation procedure x 10 days ago, comes in for dizziness and near sycope, and with sob. She was admitted for acute on chronic diastolic HF.   Plan:  1. Echocardiogram.  2. Continue with IV lasix.  3. Cardiology consult .   Kathlen Mody, MD [phone]

## 2017-06-10 ENCOUNTER — Inpatient Hospital Stay (HOSPITAL_COMMUNITY): Payer: PPO

## 2017-06-10 DIAGNOSIS — I340 Nonrheumatic mitral (valve) insufficiency: Secondary | ICD-10-CM

## 2017-06-10 DIAGNOSIS — I5041 Acute combined systolic (congestive) and diastolic (congestive) heart failure: Secondary | ICD-10-CM

## 2017-06-10 DIAGNOSIS — I351 Nonrheumatic aortic (valve) insufficiency: Secondary | ICD-10-CM

## 2017-06-10 DIAGNOSIS — I5043 Acute on chronic combined systolic (congestive) and diastolic (congestive) heart failure: Secondary | ICD-10-CM

## 2017-06-10 DIAGNOSIS — I110 Hypertensive heart disease with heart failure: Principal | ICD-10-CM

## 2017-06-10 DIAGNOSIS — J810 Acute pulmonary edema: Secondary | ICD-10-CM

## 2017-06-10 DIAGNOSIS — J439 Emphysema, unspecified: Secondary | ICD-10-CM

## 2017-06-10 LAB — ECHOCARDIOGRAM COMPLETE
AOASC: 31 cm
AVPHT: 711 ms
CHL CUP MV DEC (S): 359
EWDT: 359 ms
FS: 25 % — AB (ref 28–44)
Height: 60 in
IVS/LV PW RATIO, ED: 1.01
LA ID, A-P, ES: 49 mm
LA diam index: 3.13 cm/m2
LA vol A4C: 103 ml
LA vol: 91.5 mL
LAVOLIN: 58.4 mL/m2
LDCA: 3.14 cm2
LEFT ATRIUM END SYS DIAM: 49 mm
LV PW d: 12.8 mm — AB (ref 0.6–1.1)
LVOTD: 20 mm
MV Peak grad: 5 mmHg
MV pk A vel: 40.7 m/s
MV pk E vel: 107 m/s
MVAP: 2.02 cm2
P 1/2 time: 114 ms
PV Reg vel dias: 101 cm/s
RV sys press: 31 mmHg
Reg peak vel: 263 cm/s
TAPSE: 26.3 mm
TRMAXVEL: 263 cm/s
WEIGHTICAEL: 2020.8 [oz_av]

## 2017-06-10 LAB — BASIC METABOLIC PANEL
Anion gap: 8 (ref 5–15)
BUN: 6 mg/dL (ref 6–20)
CALCIUM: 8.7 mg/dL — AB (ref 8.9–10.3)
CHLORIDE: 96 mmol/L — AB (ref 101–111)
CO2: 31 mmol/L (ref 22–32)
CREATININE: 0.5 mg/dL (ref 0.44–1.00)
GFR calc non Af Amer: >60 mL/min (ref >60)
Glucose, Bld: 96 mg/dL (ref 65–99)
Potassium: 2.9 mmol/L — ABNORMAL LOW (ref 3.5–5.1)
SODIUM: 135 mmol/L (ref 135–145)

## 2017-06-10 LAB — MAGNESIUM: MAGNESIUM: 2.1 mg/dL (ref 1.7–2.4)

## 2017-06-10 MED ORDER — POTASSIUM CHLORIDE CRYS ER 20 MEQ PO TBCR
40.0000 meq | EXTENDED_RELEASE_TABLET | ORAL | Status: AC
  Administered 2017-06-10 (×3): 40 meq via ORAL
  Filled 2017-06-10 (×2): qty 2

## 2017-06-10 MED ORDER — FUROSEMIDE 40 MG PO TABS
40.0000 mg | ORAL_TABLET | Freq: Every day | ORAL | Status: DC
  Administered 2017-06-11: 40 mg via ORAL
  Filled 2017-06-10 (×2): qty 1

## 2017-06-10 MED ORDER — TIOTROPIUM BROMIDE MONOHYDRATE 18 MCG IN CAPS
18.0000 ug | ORAL_CAPSULE | Freq: Every day | RESPIRATORY_TRACT | Status: DC
  Administered 2017-06-12: 18 ug via RESPIRATORY_TRACT
  Filled 2017-06-10: qty 5

## 2017-06-10 NOTE — Consult Note (Signed)
Cardiology Consultation:   Patient ID: Jennifer Martin; [id number]; [birth year]   Admit date: 06/08/2017 Date of Consult: 06/10/2017  Primary Care Provider: [name]., MD Primary Cardiologist: Dr Jennifer Martin Primary Electrophysiologist:  Dr Jennifer Martin   Patient Profile:   Jennifer Martin is a 61 y.o. female with a hx of atrial flutter who is being seen today for the evaluation of CHF at the request of Dr Jennifer Martin.  History of Present Illness:   Jennifer Martin is a pleasant 61 y/o female followed by Dr Jennifer Martin. She has a history of CAD (no details) with a low risk Myoview April 2018, mild LVD by echo Dec 2017 (LBBB), COPD, and atrial fibrillation and atrial flutter. She was placed on Amiodarone as an OP earlier this year. She is on chronic anticoagulation. She underwent recent EP evaluation and had a RFA 05/29/17. She tell me when she came in for the ablation she weighed 128 lbs and when she was discharged she got home and her wgt was 140 lbs (132 lbs by hospital records). She has noticed abdominal bloating and on 8/20/1`8 come to the ED after she had significant dyspnea after a shower. On admission her BNP was 739 and she had CHF on CXR. She has improved after 5.49L diuresis.   Past Medical History:  Diagnosis Date  . Anxiety   . Atherosclerosis of coronary artery of native heart with angina pectoris (HCC)   . Atrial flutter (HCC)   . CAD (coronary artery disease)   . COPD (chronic obstructive pulmonary disease) (HCC)   . Demand ischemia (HCC)   . Depression   . Diverticulosis   . Hyperlipidemia   . Hypertension   . Inappropriate ADH syndrome (HCC)   . Migraine    "none since ~ 2012; mild ones then when I did have them because of the beta blockers I was on" (06/09/2017)  . Myocardial infarction (HCC)    "I've had light ones" (06/09/2017)  . Osteoarthritis   . Pneumonia    "couple times" (06/09/2017)    Past Surgical History:  Procedure Laterality Date  . ATRIAL FIBRILLATION ABLATION N/A  05/29/2017   Procedure: Atrial Fibrillation Ablation;  Surgeon: Jennifer Lemming, MD;  Location: [facility] INVASIVE CV LAB;  Service: Cardiovascular;  Laterality: N/A;  . BREAST SURGERY Left    "took a gland out; milk duct"  . CARDIAC CATHETERIZATION    . CARPAL TUNNEL RELEASE Left   . DILATION AND CURETTAGE OF UTERUS     "related to heavy bleeding"  . INGUINAL HERNIA REPAIR Left   . SHOULDER ARTHROSCOPY WITH ROTATOR CUFF REPAIR Right   . TRACHEOSTOMY  2006   "closed on it's own"  . TUBAL LIGATION    . VAGINAL HYSTERECTOMY     "fibroids"     Home Medications:  Prior to Admission medications   Medication Sig Start Date End Date Taking? Authorizing Provider  acetaminophen (TYLENOL) 500 MG tablet Take 1,000 mg by mouth 2 (two) times daily as needed for mild pain or headache.   Yes [provider]  albuterol (PROVENTIL HFA;VENTOLIN HFA) 108 (90 Base) MCG/ACT inhaler Inhale 2 puffs into the lungs every 6 (six) hours as needed for wheezing or shortness of breath.   Yes [provider]  amiodarone (PACERONE) 200 MG tablet Take 1 tablet (200 mg total) by mouth daily. 05/30/17  Yes Strader, [place] M, PA-C  budesonide-formoterol (SYMBICORT) 160-4.5 MCG/ACT inhaler Inhale 2 puffs into the lungs 2 (two) times daily.  Yes [provider]  carvedilol (COREG) 12.5 MG tablet Take 12.5 mg by mouth 2 (two) times daily with a meal.   Yes [provider]  clopidogrel (PLAVIX) 75 MG tablet Take 75 mg by mouth daily.   Yes [provider]  diltiazem (CARDIZEM CD) 120 MG 24 hr capsule Take 120 mg by mouth daily.   Yes [provider]  escitalopram (LEXAPRO) 10 MG tablet Take 5 mg by mouth daily.   Yes [provider]  furosemide (LASIX) 20 MG tablet Take 20 mg by mouth every other day. FOR WEIGHT 131 OR GREATER    Yes [provider]  hydrALAZINE (APRESOLINE) 25 MG tablet Take 0.5 tablets (12.5 mg total) by mouth 2 (two) times daily. 04/20/17  06/09/17 Yes Baldo Daub, MD  LORazepam (ATIVAN) 1 MG tablet Take 1 mg by mouth every 8 (eight) hours as needed for anxiety.    Yes [provider]  nitroGLYCERIN (NITROSTAT) 0.4 MG SL tablet Place 0.4 mg under the tongue every 5 (five) minutes as needed for chest pain.   Yes [provider]  pantoprazole (PROTONIX) 40 MG tablet Take 40 mg by mouth daily.   Yes [provider]  potassium chloride SA (K-DUR,KLOR-CON) 20 MEQ tablet Take 20 mEq by mouth every other day.    Yes [provider]  rivaroxaban (XARELTO) 20 MG TABS tablet Take 1 tablet (20 mg total) by mouth daily. 04/20/17  Yes Baldo Daub, MD  fluticasone (FLONASE) 50 MCG/ACT nasal spray Place 2 sprays into both nostrils daily as needed for allergies.     [provider]    Inpatient Medications: Scheduled Meds: . amiodarone  200 mg Oral Daily  . carvedilol  12.5 mg Oral BID WC  . clopidogrel  75 mg Oral Daily  . diltiazem  120 mg Oral Daily  . escitalopram  5 mg Oral Daily  . furosemide  40 mg Intravenous Daily  . hydrALAZINE  12.5 mg Oral BID  . lisinopril  2.5 mg Oral Daily  . mometasone-formoterol  2 puff Inhalation BID  . pantoprazole  40 mg Oral Daily  . potassium chloride  40 mEq Oral Q4H  . rivaroxaban  20 mg Oral QAC supper  . sodium chloride flush  3 mL Intravenous Q12H   Continuous Infusions: . sodium chloride     PRN Meds: sodium chloride, acetaminophen, albuterol, fluticasone, LORazepam, ondansetron (ZOFRAN) IV, sodium chloride flush  Allergies:    Allergies  Allergen Reactions  . Cefdinir Diarrhea  . Levaquin [Levofloxacin In D5w] Diarrhea  . Morphine And Related Nausea Only    Social History:   Social History   Social History  . Marital status: Married    Spouse name: N/A  . Number of children: N/A  . Years of education: N/A   Occupational History  . Not on file.   Social History Main Topics  . Smoking status: Current Every Day Smoker     Packs/day: 0.60    Years: 34.00    Types: Cigarettes  . Smokeless tobacco: Never Used  . Alcohol use No  . Drug use: No  . Sexual activity: Not Currently   Other Topics Concern  . Not on file   Social History Narrative  . No narrative on file    Family History:    Family History  Problem Relation Age of Onset  . CAD Brother   . Heart disease Brother      ROS:  Please see  the history of present illness.  ROS   All other ROS reviewed and negative.     Physical Exam/Data:   Vitals:   06/10/17 0428 06/10/17 0741 06/10/17 0855 06/10/17 1201  BP:   118/64 129/70  Pulse:    (!) 51  Resp:   18 18  Temp:   98.2 F (36.8 C) 98 F (36.7 C)  TempSrc:   Oral Oral  SpO2:  95% 95% 98%  Weight: 126 lb 4.8 oz (57.3 kg)     Height:        Intake/Output Summary (Last 24 hours) at 06/10/17 1425 Last data filed at 06/10/17 1200  Gross per 24 hour  Intake              360 ml  Output             1500 ml  Net            -1140 ml   Filed Weights   06/08/17 2240 06/09/17 0809 06/10/17 0428  Weight: 132 lb (59.9 kg) 126 lb 12.8 oz (57.5 kg) 126 lb 4.8 oz (57.3 kg)   Body mass index is 24.67 kg/m.  General:  Well nourished, well developed, in no acute distress HEENT: normal Lymph: no adenopathy Neck: no JVD Endocrine:  No thryomegaly Vascular: No carotid bruits; FA pulses 2+ bilaterally without bruits  Cardiac:  normal S1, S2; RRR; no murmur  Lungs:  clear to auscultation bilaterally, no wheezing, rhonchi or rales  Abd: soft, nontender, no hepatomegaly  Ext: no edema Musculoskeletal:  No deformities, BUE and BLE strength normal and equal Skin: warm and dry  Neuro:  CNs 2-12 intact, no focal abnormalities noted Psych:  Normal affect   EKG:  The EKG was personally reviewed and demonstrates:  NSR LBBB Telemetry:  Telemetry was personally reviewed and demonstrates:  NSR  Relevant CV Studies: Echo Dec 2017 at [facility] Myoview April 2018 at [facility]  Laboratory  Data:  Chemistry Recent Labs Lab 06/08/17 2252 06/10/17 0428  NA 135 135  K 3.8 2.9*  CL 98* 96*  CO2 26 31  GLUCOSE 115* 96  BUN 8 6  CREATININE 0.57 0.50  CALCIUM 8.7* 8.7*  GFRNONAA >60 >60  GFRAA >60 >60  ANIONGAP 11 8    No results for input(s): PROT, ALBUMIN, AST, ALT, ALKPHOS, BILITOT in the last 168 hours. Hematology Recent Labs Lab 06/08/17 2252  WBC 9.6  RBC 3.64*  HGB 10.6*  HCT 32.7*  MCV 89.8  MCH 29.1  MCHC 32.4  RDW 13.8  PLT 348   Cardiac EnzymesNo results for input(s): TROPONINI in the last 168 hours.  Recent Labs Lab 06/08/17 2314  TROPIPOC 0.04    BNP Recent Labs Lab 06/08/17 2252  BNP 739.2*    DDimer  Recent Labs Lab 06/08/17 2252  DDIMER 1.45*    Radiology/Studies:  Dg Chest 2 View  Result Date: 06/08/2017 CLINICAL DATA:  Shortness of breath.  Near syncope. EXAM: CHEST  2 VIEW COMPARISON:  Limited CT chest May 27, 2017 FINDINGS: The cardiac silhouette is moderately enlarged, mediastinal silhouette is nonsuspicious. Diffuse interstitial prominence with Kerley B-lines. No pleural effusion or focal consolidation. No pneumothorax. Soft tissue planes and included osseous structures are nonsuspicious. IMPRESSION: Moderate cardiomegaly. Interstitial prominence most consistent with pulmonary edema. Electronically Signed   By: Awilda Metro M.D.   On: 06/08/2017 23:42   Ct Angio Chest Pe W And/or Wo Contrast  Result Date: 06/09/2017 CLINICAL DATA:  Shortness of  breath and near syncope. Pulmonary embolus suspected, intermediate probability. Elevated D-dimer. Recent atrial fibrillation ablation. EXAM: CT ANGIOGRAPHY CHEST WITH CONTRAST TECHNIQUE: Multidetector CT imaging of the chest was performed using the standard protocol during bolus administration of intravenous contrast. Multiplanar CT image reconstructions and MIPs were obtained to evaluate the vascular anatomy. CONTRAST:  80 cc Isovue 370 IV COMPARISON:  Chest radiograph earlier  this day.  Chest CT 05/27/2017 FINDINGS: Cardiovascular: There are no filling defects within the pulmonary arteries to suggest pulmonary embolus. Atherosclerosis of the thoracic aorta. Coronary artery calcifications. The heart is enlarged with right and left heart dilatation. Mediastinum/Nodes: No mediastinal or hilar adenopathy. The esophagus is decompressed. No pericardial effusion. Visualized thyroid gland normal. Lungs/Pleura: Smooth septal thickening consistent with pulmonary edema, most prominent in the lower lobes. Superimposed lower lobe linear atelectasis. Unchanged smoothly marginated 9 mm right lower lobe pulmonary nodule abutting the major fissure. No new pulmonary nodule. Mild underlying emphysema and central bronchial thickening. No pleural fluid. Upper Abdomen: No acute abnormality. Musculoskeletal: There are no acute or suspicious osseous abnormalities. Review of the MIP images confirms the above findings. IMPRESSION: 1. No pulmonary embolus. 2. Cardiomegaly with pulmonary edema. 3. Emphysema with central bronchial thickening. 4. Aortic Atherosclerosis (ICD10-I70.0). Coronary artery calcifications. 5. Unchanged smoothly marginated 9 mm fissure based nodule in the superior segment of the right lower lobe, possible intrapulmonary lymph node. Follow-up recommendations as before. Electronically Signed   By: Rubye Oaks M.D.   On: 06/09/2017 01:14    Assessment and Plan:   Acute CHF- Etiology not yet determined but suspect this was from volume overload during her admission and combined diastolic and systolic dysfunction.  Atrial fibrillation, atrial flutter- S/p RFA 05/29/17 to NSR. The plan is to eventually get her off Amiodarone secondary to her young age and COPD history.   Chronic anticoagulation- CHA2DS2 VASc=3. She is on Xarelto  CAD- No details but no angina and recent low risk Myoview April 2018. Coronary CTA done 05/27/17 -"Normal coronary origin. Right dominance. The study was  performed without use of NTG and insufficient for plaque Evaluation".  HTN- B/P looks to be pretty well controlled  COPD- Smoker, on inhalers. Emphysema noted on chest CT   Plan: MD to see. She looks improved after diuresis. Wgt now at baseline. There was mention of doing a cardiac CT to r/o pulmonary vein stenosis but not sure that is indicated since she improved with diuresis. Echo is pending. K+ replacement was ordered  SignedCorine Shelter, PA-C  06/10/2017 2:25 PM

## 2017-06-10 NOTE — Progress Notes (Signed)
  Echocardiogram 2D Echocardiogram has been performed.  Dorena Dew Shakima Nisley 06/10/2017, 4:06 PM

## 2017-06-10 NOTE — Progress Notes (Signed)
HR on the lower 40's c/o slight SOB on exertion. 02 2lnc put on pt.Dr Toniann Fail made aware including beta blocker meds that pt is currently taking.observe pt closely. No of CP

## 2017-06-10 NOTE — Progress Notes (Addendum)
PROGRESS NOTE    Jennifer Martin  UJW:[id number] DOB: 15-Sep-1956 DOA: 06/08/2017 PCP: [name]., MD    Brief Narrative:  Jennifer Martin is a 61 y.o. female with medical history significant of diastolic hypertensive CHF, A.Fib, just underwent ablation procedure x 10 days ago.  This successfully converted her to NSR.  Of note they did have to ablate around all 4 pulmonary veins during that procedure according to discharge summary.  Patient presents to the ED with c/o SOB and near syncope.  2 day history of cough productive of pink tinged sputum.  Abdominal discomfort.  No weight gain nor peripheral edema. Chest x-ray and CT angiogram chest consistent with pulmonary edema.    Assessment & Plan:   Principal Problem:   Acute on chronic diastolic (congestive) heart failure (HCC) Active Problems:   Hypertensive heart disease with heart failure (HCC)   AF (atrial fibrillation) (HCC)   Acute CHF (congestive heart failure) (HCC)  #1 acute on chronic diastolic heart failure Questionable etiology. Patient with recent cathether ablation for atrial fibrillation 05/29/2017 now presented with acute heart failure.point-of-care troponin 0.04. BNP elevated. Point-of-care troponin negative.Patient with a urine output of 2.2 L over the past 24 hours. Patient is -5.4 L during this hospitalization. Current weight is 126 pounds from 132 pounds on admission.2-D echo pending. Strict I's and O's. Daily weights. Continue Coreg, Plavix, Cardizem,hydralazine, lisinopril Change IV Lasix to oral Lasix. Consult with cardiology for further evaluation and management as patient with recent ablation 05/29/2017.  #2 atrial fibrillation CHA2DS2VASC 3 Currently rate controlled on Cardizem and amiodarone. Xarelto for anticoagulation.consulted cardiology.  #3 COPD Stable. Continue dulera. Place on spiriva.  #4 hypertension  Stable. Continue current regimen of Coreg,zem, cardiology, lisinopril.  #5  hypokalemia Replete.    DVT prophylaxis: xarelto Code Status: Full Family Communication: updated patient and family at bedside. Disposition Plan: home once acute CHF exacerbation has resolved and pending cardiology evaluation.   Consultants:   Cardiology pending  Procedures:   2-D echo pending  Chest x-ray 06/08/2017  Antimicrobials:   none   Subjective: Patient states shortness of breath has significantly improved. Patient states ambulated in the hallway with no significant shortness of breath.  Objective: Vitals:   06/10/17 0428 06/10/17 0741 06/10/17 0855 06/10/17 1201  BP:   118/64 129/70  Pulse:    (!) 51  Resp:   18 18  Temp:   98.2 F (36.8 C) 98 F (36.7 C)  TempSrc:   Oral Oral  SpO2:  95% 95% 98%  Weight: 57.3 kg (126 lb 4.8 oz)     Height:        Intake/Output Summary (Last 24 hours) at 06/10/17 1227 Last data filed at 06/10/17 1200  Gross per 24 hour  Intake              360 ml  Output             1700 ml  Net            -1340 ml   Filed Weights   06/08/17 2240 06/09/17 0809 06/10/17 0428  Weight: 59.9 kg (132 lb) 57.5 kg (126 lb 12.8 oz) 57.3 kg (126 lb 4.8 oz)    Examination:  General exam: Appears calm and comfortable  Respiratory system: Clear to auscultation. Respiratory effort normal. Cardiovascular system: S1 & S2 heard, RRR. No JVD, murmurs, rubs, gallops or clicks. No pedal edema. Gastrointestinal system: Abdomen is nondistended, soft and nontender. No organomegaly or masses  felt. Normal bowel sounds heard. Central nervous system: Alert and oriented. No focal neurological deficits. Extremities: Symmetric 5 x 5 power. Skin: No rashes, lesions or ulcers Psychiatry: Judgement and insight appear normal. Mood & affect appropriate.     Data Reviewed: I have personally reviewed following labs and imaging studies  CBC:  Recent Labs Lab 06/08/17 2252  WBC 9.6  HGB 10.6*  HCT 32.7*  MCV 89.8  PLT 348   Basic Metabolic  Panel:  Recent Labs Lab 06/08/17 2252 06/10/17 0428  NA 135 135  K 3.8 2.9*  CL 98* 96*  CO2 26 31  GLUCOSE 115* 96  BUN 8 6  CREATININE 0.57 0.50  CALCIUM 8.7* 8.7*  MG  --  2.1   GFR: Estimated Creatinine Clearance: 57.8 mL/min (by C-G formula based on SCr of 0.5 mg/dL). Liver Function Tests: No results for input(s): AST, ALT, ALKPHOS, BILITOT, PROT, ALBUMIN in the last 168 hours. No results for input(s): LIPASE, AMYLASE in the last 168 hours. No results for input(s): AMMONIA in the last 168 hours. Coagulation Profile: No results for input(s): INR, PROTIME in the last 168 hours. Cardiac Enzymes: No results for input(s): CKTOTAL, CKMB, CKMBINDEX, TROPONINI in the last 168 hours. BNP (last 3 results)  Recent Labs  04/20/17 1133  PROBNP 795*   HbA1C: No results for input(s): HGBA1C in the last 72 hours. CBG:  Recent Labs Lab 06/08/17 2248  GLUCAP 122*   Lipid Profile: No results for input(s): CHOL, HDL, LDLCALC, TRIG, CHOLHDL, LDLDIRECT in the last 72 hours. Thyroid Function Tests: No results for input(s): TSH, T4TOTAL, FREET4, T3FREE, THYROIDAB in the last 72 hours. Anemia Panel: No results for input(s): VITAMINB12, FOLATE, FERRITIN, TIBC, IRON, RETICCTPCT in the last 72 hours. Sepsis Labs: No results for input(s): PROCALCITON, LATICACIDVEN in the last 168 hours.  No results found for this or any previous visit (from the past 240 hour(s)).       Radiology Studies: Dg Chest 2 View  Result Date: 06/08/2017 CLINICAL DATA:  Shortness of breath.  Near syncope. EXAM: CHEST  2 VIEW COMPARISON:  Limited CT chest May 27, 2017 FINDINGS: The cardiac silhouette is moderately enlarged, mediastinal silhouette is nonsuspicious. Diffuse interstitial prominence with Kerley B-lines. No pleural effusion or focal consolidation. No pneumothorax. Soft tissue planes and included osseous structures are nonsuspicious. IMPRESSION: Moderate cardiomegaly. Interstitial prominence  most consistent with pulmonary edema. Electronically Signed   By: Awilda Metro M.D.   On: 06/08/2017 23:42   Ct Angio Chest Pe W And/or Wo Contrast  Result Date: 06/09/2017 CLINICAL DATA:  Shortness of breath and near syncope. Pulmonary embolus suspected, intermediate probability. Elevated D-dimer. Recent atrial fibrillation ablation. EXAM: CT ANGIOGRAPHY CHEST WITH CONTRAST TECHNIQUE: Multidetector CT imaging of the chest was performed using the standard protocol during bolus administration of intravenous contrast. Multiplanar CT image reconstructions and MIPs were obtained to evaluate the vascular anatomy. CONTRAST:  80 cc Isovue 370 IV COMPARISON:  Chest radiograph earlier this day.  Chest CT 05/27/2017 FINDINGS: Cardiovascular: There are no filling defects within the pulmonary arteries to suggest pulmonary embolus. Atherosclerosis of the thoracic aorta. Coronary artery calcifications. The heart is enlarged with right and left heart dilatation. Mediastinum/Nodes: No mediastinal or hilar adenopathy. The esophagus is decompressed. No pericardial effusion. Visualized thyroid gland normal. Lungs/Pleura: Smooth septal thickening consistent with pulmonary edema, most prominent in the lower lobes. Superimposed lower lobe linear atelectasis. Unchanged smoothly marginated 9 mm right lower lobe pulmonary nodule abutting the major fissure. No new  pulmonary nodule. Mild underlying emphysema and central bronchial thickening. No pleural fluid. Upper Abdomen: No acute abnormality. Musculoskeletal: There are no acute or suspicious osseous abnormalities. Review of the MIP images confirms the above findings. IMPRESSION: 1. No pulmonary embolus. 2. Cardiomegaly with pulmonary edema. 3. Emphysema with central bronchial thickening. 4. Aortic Atherosclerosis (ICD10-I70.0). Coronary artery calcifications. 5. Unchanged smoothly marginated 9 mm fissure based nodule in the superior segment of the right lower lobe, possible  intrapulmonary lymph node. Follow-up recommendations as before. Electronically Signed   By: Rubye Oaks M.D.   On: 06/09/2017 01:14        Scheduled Meds: . amiodarone  200 mg Oral Daily  . carvedilol  12.5 mg Oral BID WC  . clopidogrel  75 mg Oral Daily  . diltiazem  120 mg Oral Daily  . escitalopram  5 mg Oral Daily  . furosemide  40 mg Intravenous Daily  . hydrALAZINE  12.5 mg Oral BID  . lisinopril  2.5 mg Oral Daily  . mometasone-formoterol  2 puff Inhalation BID  . pantoprazole  40 mg Oral Daily  . potassium chloride  40 mEq Oral Q4H  . rivaroxaban  20 mg Oral QAC supper  . sodium chloride flush  3 mL Intravenous Q12H   Continuous Infusions: . sodium chloride       LOS: 1 day    Time spent: 40 minutes    Sharief Wainwright, MD [facility] Pager [phone]  If 7PM-7AM, please contact night-coverage [email or web address] Password [facility] 06/10/2017, 12:27 PM

## 2017-06-11 ENCOUNTER — Encounter (HOSPITAL_COMMUNITY): Payer: Self-pay | Admitting: Physician Assistant

## 2017-06-11 DIAGNOSIS — I481 Persistent atrial fibrillation: Secondary | ICD-10-CM

## 2017-06-11 DIAGNOSIS — I441 Atrioventricular block, second degree: Secondary | ICD-10-CM

## 2017-06-11 LAB — BASIC METABOLIC PANEL
Anion gap: 9 (ref 5–15)
BUN: 8 mg/dL (ref 6–20)
CHLORIDE: 98 mmol/L — AB (ref 101–111)
CO2: 24 mmol/L (ref 22–32)
Calcium: 8.6 mg/dL — ABNORMAL LOW (ref 8.9–10.3)
Creatinine, Ser: 0.49 mg/dL (ref 0.44–1.00)
GFR calc non Af Amer: >60 mL/min (ref >60)
Glucose, Bld: 92 mg/dL (ref 65–99)
POTASSIUM: 4.2 mmol/L (ref 3.5–5.1)
SODIUM: 131 mmol/L — AB (ref 135–145)

## 2017-06-11 LAB — LIPID PANEL
CHOL/HDL RATIO: 3.2 ratio
CHOLESTEROL: 179 mg/dL (ref 0–200)
HDL: 56 mg/dL (ref >40)
LDL Cholesterol: 103 mg/dL — ABNORMAL HIGH (ref 0–99)
TRIGLYCERIDES: 100 mg/dL (ref <150)
VLDL: 20 mg/dL (ref 0–40)

## 2017-06-11 MED ORDER — LOSARTAN POTASSIUM 25 MG PO TABS
25.0000 mg | ORAL_TABLET | Freq: Every day | ORAL | Status: DC
Start: 2017-06-11 — End: 2017-06-13
  Administered 2017-06-11 – 2017-06-13 (×3): 25 mg via ORAL
  Filled 2017-06-11 (×3): qty 1

## 2017-06-11 MED ORDER — CARVEDILOL 6.25 MG PO TABS
6.2500 mg | ORAL_TABLET | Freq: Two times a day (BID) | ORAL | Status: DC
  Filled 2017-06-11: qty 1

## 2017-06-11 NOTE — Progress Notes (Signed)
Patient has been bleeding from a previous IV site in her right forearm.  MD notified.  Pressure dressing applied.  Dressing changed twice this shift. Elnita Maxwell, RN

## 2017-06-11 NOTE — Progress Notes (Signed)
PROGRESS NOTE    Jennifer Martin  ZOX:[id number] DOB: 1955-12-08 DOA: 06/08/2017 PCP: [name]., MD    Brief Narrative:  Jennifer Martin is a 61 y.o. female with medical history significant of diastolic hypertensive CHF, A.Fib, just underwent ablation procedure x 10 days ago.  This successfully converted her to NSR.  Of note they did have to ablate around all 4 pulmonary veins during that procedure according to discharge summary.  Patient presents to the ED with c/o SOB and near syncope.  2 day history of cough productive of pink tinged sputum.  Abdominal discomfort.  No weight gain nor peripheral edema. Chest x-ray and CT angiogram chest consistent with pulmonary edema.  2-D echo done showed a depressed EF of 35-40%. Patient also noted to have a type I second-degree AV block with left bundle branch block. Cardiac medications adjusted per cardiology.    Assessment & Plan:   Principal Problem:   Acute on chronic diastolic (congestive) heart failure (HCC) Active Problems:   Hypertensive heart disease with heart failure (HCC)   AF (atrial fibrillation) (HCC)   Acute CHF (congestive heart failure) (HCC)   Acute pulmonary edema (HCC)   Acute combined systolic and diastolic heart failure (HCC)   Mobitz type 1 second degree AV block  #1 acute on chronic diastolic heart failure/coronary artery disease Questionable etiology. Patient with recent cathether ablation for atrial fibrillation 05/29/2017 now presented with acute heart failure.point-of-care troponin 0.04. BNP elevated. Point-of-care troponin negative.Patient with a urine output of 2.35 L over the past 24 hours. Patient is -7.3 L during this hospitalization. Current weight is 126 pounds from 132 pounds on admission.2-D echo with a EF of 35-40%, PFO with diffuse hypokinesis. Strict I's and O's. Daily weights. Continue Coreg, Plavix. Cardizem and Coreg discontinued secondary to bradycardia and depressed EF. IV Lasix have been changed to  oral Lasix. Hydralazine has been discontinued per cardiology. ACE inhibitor has been changed to ARB per cardiology with plans to change to entresto a blood pressure remained stable. Cardiology following and appreciate input and recommendation.  #2 atrial fibrillation CHA2DS2VASC 3 Patient status post ablation 05/29/2017. Currently rate controlled amiodarone. Cardizem has been discontinued as well as Coreg secondary to bradycardia per cardiology. Xarelto for anticoagulation.consulted cardiology.  #3 COPD Stable. Continue dulera and spiriva.  #4 hypertension  Stable. Continue current regimen Lasix, cozaar. Coreg, Cardizem and hydralazine have been discontinued secondary to bradycardia per cardiology. Per cardiology.  #5 hypokalemia Secondary to diuresis Repleted.  #6 bradycardia/type I second-degree AV block with LBBB Patient noted to have bradycardia in the 40s overnight. Telemetry was reviewed by cardiology which did show intermittent type I second-degree AV block with LBBB. Patient's LBBB is old. Per cardiology in discussions with the EP patient's Coreg was discontinued. Cardizem also discontinued due to decreased EF. Patient's hydralazine was also been stopped per cardiology.     DVT prophylaxis: xarelto Code Status: Full Family Communication: updated patient and family at bedside. Disposition Plan: home once acute CHF exacerbation has resolved and pending cardiology evaluation.   Consultants:   Cardiology: Dr. Duke Salvia 06/10/2017  Procedures:   2-D echo 06/10/2017--- EF 35-40%, diffuse hypokinesis, mild-to-moderate aortic valve regurgitation, mild mitral valvular regurgitation, moderately dilated left atrium, patent foramen ovale.  Chest x-ray 06/08/2017  Antimicrobials:   none   Subjective: Patient states shortness of breath has improved, however states on ambulation from bathroom to bed she got short of breath. No chest pain. Patient noted to have a bout of bradycardia  last night with heart rate in the 40s.  Objective: Vitals:   06/11/17 0600 06/11/17 0836 06/11/17 0900 06/11/17 1238  BP: 132/71   136/77  Pulse: (!) 54  (!) 57 (!) 55  Resp:    18  Temp: 98.8 F (37.1 C)   97.8 F (36.6 C)  TempSrc: Oral   Oral  SpO2: 98% 98%  100%  Weight: 57.4 kg (126 lb 9.6 oz)     Height:        Intake/Output Summary (Last 24 hours) at 06/11/17 1541 Last data filed at 06/11/17 1115  Gross per 24 hour  Intake              240 ml  Output             2050 ml  Net            -1810 ml   Filed Weights   06/09/17 0809 06/10/17 0428 06/11/17 0600  Weight: 57.5 kg (126 lb 12.8 oz) 57.3 kg (126 lb 4.8 oz) 57.4 kg (126 lb 9.6 oz)    Examination:  General exam: Appears calm and comfortable  Respiratory system: Clear to auscultation. Respiratory effort normal. Cardiovascular system: S1 & S2 heard, RRR. No JVD, murmurs, rubs, gallops or clicks. No pedal edema. Gastrointestinal system: Abdomen is nondistended, soft and nontender. No organomegaly or masses felt. Normal bowel sounds heard. Central nervous system: Alert and oriented. No focal neurological deficits. Extremities: Symmetric 5 x 5 power. Skin: No rashes, lesions or ulcers Psychiatry: Judgement and insight appear normal. Mood & affect appropriate.     Data Reviewed: I have personally reviewed following labs and imaging studies  CBC:  Recent Labs Lab 06/08/17 2252  WBC 9.6  HGB 10.6*  HCT 32.7*  MCV 89.8  PLT 348   Basic Metabolic Panel:  Recent Labs Lab 06/08/17 2252 06/10/17 0428 06/11/17 0450  NA 135 135 131*  K 3.8 2.9* 4.2  CL 98* 96* 98*  CO2 26 31 24   GLUCOSE 115* 96 92  BUN 8 6 8   CREATININE 0.57 0.50 0.49  CALCIUM 8.7* 8.7* 8.6*  MG  --  2.1  --    GFR: Estimated Creatinine Clearance: 57.9 mL/min (by C-G formula based on SCr of 0.49 mg/dL). Liver Function Tests: No results for input(s): AST, ALT, ALKPHOS, BILITOT, PROT, ALBUMIN in the last 168 hours. No results for  input(s): LIPASE, AMYLASE in the last 168 hours. No results for input(s): AMMONIA in the last 168 hours. Coagulation Profile: No results for input(s): INR, PROTIME in the last 168 hours. Cardiac Enzymes: No results for input(s): CKTOTAL, CKMB, CKMBINDEX, TROPONINI in the last 168 hours. BNP (last 3 results)  Recent Labs  04/20/17 1133  PROBNP 795*   HbA1C: No results for input(s): HGBA1C in the last 72 hours. CBG:  Recent Labs Lab 06/08/17 2248  GLUCAP 122*   Lipid Profile: No results for input(s): CHOL, HDL, LDLCALC, TRIG, CHOLHDL, LDLDIRECT in the last 72 hours. Thyroid Function Tests: No results for input(s): TSH, T4TOTAL, FREET4, T3FREE, THYROIDAB in the last 72 hours. Anemia Panel: No results for input(s): VITAMINB12, FOLATE, FERRITIN, TIBC, IRON, RETICCTPCT in the last 72 hours. Sepsis Labs: No results for input(s): PROCALCITON, LATICACIDVEN in the last 168 hours.  No results found for this or any previous visit (from the past 240 hour(s)).       Radiology Studies: Dg Chest Port 1 View  Result Date: 06/10/2017 CLINICAL DATA:  Shortness of breath today. EXAM:  PORTABLE CHEST 1 VIEW COMPARISON:  PA and lateral chest 06/08/2017 and CT chest 06/09/2017. FINDINGS: There is cardiomegaly without edema. No pneumothorax or pleural effusion. Atherosclerosis noted. No acute bony abnormality. IMPRESSION: Cardiomegaly without acute disease. Electronically Signed   By: Drusilla Kanner M.D.   On: 06/10/2017 20:29        Scheduled Meds: . amiodarone  200 mg Oral Daily  . clopidogrel  75 mg Oral Daily  . escitalopram  5 mg Oral Daily  . furosemide  40 mg Oral Daily  . losartan  25 mg Oral Daily  . mometasone-formoterol  2 puff Inhalation BID  . pantoprazole  40 mg Oral Daily  . rivaroxaban  20 mg Oral QAC supper  . sodium chloride flush  3 mL Intravenous Q12H  . tiotropium  18 mcg Inhalation Daily   Continuous Infusions: . sodium chloride       LOS: 2 days     Time spent: 40 minutes    Donnell Wion, MD [facility] Pager [phone]  If 7PM-7AM, please contact night-coverage [email or web address] Password [facility] 06/11/2017, 3:41 PM

## 2017-06-11 NOTE — Progress Notes (Signed)
Progress Note  Patient Name: Jennifer Martin Date of Encounter: 06/11/2017  Primary Cardiologist: Dr [name], Dr Elberta Fortis  Patient Profile     61 y.o. female w/ hx atrial fib/flutter w/ ablation 05/29/2017, non-obs CAD w/ stress test at [facility] 01/2017 neg ischemia w/ EF 61%, SIADH, depression, COPD, tob use, HTN, anticoag w/ Xarelto, D-CHF, LBBB, was admitted 08/21 w/ CHF exacerbation  Subjective   Was a little anxious this am, better after rx. Breathing better, no CP or palps.  Was a little light-headed this am. Worried about the bradycardia>>did it cause light-headed feeling?  Inpatient Medications    Scheduled Meds: . amiodarone  200 mg Oral Daily  . carvedilol  12.5 mg Oral BID WC  . clopidogrel  75 mg Oral Daily  . diltiazem  120 mg Oral Daily  . escitalopram  5 mg Oral Daily  . furosemide  40 mg Oral Daily  . hydrALAZINE  12.5 mg Oral BID  . lisinopril  2.5 mg Oral Daily  . mometasone-formoterol  2 puff Inhalation BID  . pantoprazole  40 mg Oral Daily  . rivaroxaban  20 mg Oral QAC supper  . sodium chloride flush  3 mL Intravenous Q12H  . tiotropium  18 mcg Inhalation Daily   Continuous Infusions: . sodium chloride     PRN Meds: sodium chloride, acetaminophen, albuterol, fluticasone, LORazepam, ondansetron (ZOFRAN) IV, sodium chloride flush   Vital Signs    Vitals:   06/10/17 2047 06/11/17 0600 06/11/17 0836 06/11/17 0900  BP:  132/71    Pulse:  (!) 54  (!) 57  Resp:      Temp:  98.8 F (37.1 C)    TempSrc:  Oral    SpO2: 99% 98% 98%   Weight:  126 lb 9.6 oz (57.4 kg)    Height:        Intake/Output Summary (Last 24 hours) at 06/11/17 1020 Last data filed at 06/11/17 0555  Gross per 24 hour  Intake              240 ml  Output             1750 ml  Net            -1510 ml   Filed Weights   06/09/17 0809 06/10/17 0428 06/11/17 0600  Weight: 126 lb 12.8 oz (57.5 kg) 126 lb 4.8 oz (57.3 kg) 126 lb 9.6 oz (57.4 kg)    Telemetry    SR, sinus  brady, LBBB is old, some ?Wenkebach vs dropped PACs  - Personally Reviewed  ECG    n/a - Personally Reviewed  Physical Exam   General: Well developed, well nourished, female appearing in no acute distress. Head: Normocephalic, atraumatic.  Neck: Supple without bruits, JVD minimally elevated. Lungs:  Resp regular and unlabored, decreased BS bases w/ few rales Heart: RRR, S1, S2, no S3, S4, or murmur; no rub. Abdomen: Soft, non-tender, non-distended with normoactive bowel sounds. No hepatomegaly. No rebound/guarding. No obvious abdominal masses. Extremities: No clubbing, cyanosis, no edema. Distal pedal pulses are 2+ bilaterally. Neuro: Alert and oriented X 3. Moves all extremities spontaneously. Psych: Normal affect.  Labs    Hematology  Recent Labs Lab 06/08/17 2252  WBC 9.6  RBC 3.64*  HGB 10.6*  HCT 32.7*  MCV 89.8  MCH 29.1  MCHC 32.4  RDW 13.8  PLT 348    Chemistry  Recent Labs Lab 06/08/17 2252 06/10/17 0428 06/11/17 0450  NA 135 135  131*  K 3.8 2.9* 4.2  CL 98* 96* 98*  CO2 26 31 24   GLUCOSE 115* 96 92  BUN 8 6 8   CREATININE 0.57 0.50 0.49  CALCIUM 8.7* 8.7* 8.6*  GFRNONAA >60 >60 >60  GFRAA >60 >60 >60  ANIONGAP 11 8 9      Cardiac Enzymes   Recent Labs Lab 06/08/17 2314  TROPIPOC 0.04     BNP  Recent Labs Lab 06/08/17 2252  BNP 739.2*     DDimer   Recent Labs Lab 06/08/17 2252  DDIMER 1.45*     Radiology    Dg Chest 2 View  Result Date: 06/08/2017 CLINICAL DATA:  Shortness of breath.  Near syncope. EXAM: CHEST  2 VIEW COMPARISON:  Limited CT chest May 27, 2017 FINDINGS: The cardiac silhouette is moderately enlarged, mediastinal silhouette is nonsuspicious. Diffuse interstitial prominence with Kerley B-lines. No pleural effusion or focal consolidation. No pneumothorax. Soft tissue planes and included osseous structures are nonsuspicious. IMPRESSION: Moderate cardiomegaly. Interstitial prominence most consistent with pulmonary  edema. Electronically Signed   By: Awilda Metro M.D.   On: 06/08/2017 23:42   Ct Angio Chest Pe W And/or Wo Contrast  Result Date: 06/09/2017 CLINICAL DATA:  Shortness of breath and near syncope. Pulmonary embolus suspected, intermediate probability. Elevated D-dimer. Recent atrial fibrillation ablation. EXAM: CT ANGIOGRAPHY CHEST WITH CONTRAST TECHNIQUE: Multidetector CT imaging of the chest was performed using the standard protocol during bolus administration of intravenous contrast. Multiplanar CT image reconstructions and MIPs were obtained to evaluate the vascular anatomy. CONTRAST:  80 cc Isovue 370 IV COMPARISON:  Chest radiograph earlier this day.  Chest CT 05/27/2017 FINDINGS: Cardiovascular: There are no filling defects within the pulmonary arteries to suggest pulmonary embolus. Atherosclerosis of the thoracic aorta. Coronary artery calcifications. The heart is enlarged with right and left heart dilatation. Mediastinum/Nodes: No mediastinal or hilar adenopathy. The esophagus is decompressed. No pericardial effusion. Visualized thyroid gland normal. Lungs/Pleura: Smooth septal thickening consistent with pulmonary edema, most prominent in the lower lobes. Superimposed lower lobe linear atelectasis. Unchanged smoothly marginated 9 mm right lower lobe pulmonary nodule abutting the major fissure. No new pulmonary nodule. Mild underlying emphysema and central bronchial thickening. No pleural fluid. Upper Abdomen: No acute abnormality. Musculoskeletal: There are no acute or suspicious osseous abnormalities. Review of the MIP images confirms the above findings. IMPRESSION: 1. No pulmonary embolus. 2. Cardiomegaly with pulmonary edema. 3. Emphysema with central bronchial thickening. 4. Aortic Atherosclerosis (ICD10-I70.0). Coronary artery calcifications. 5. Unchanged smoothly marginated 9 mm fissure based nodule in the superior segment of the right lower lobe, possible intrapulmonary lymph node. Follow-up  recommendations as before. Electronically Signed   By: Rubye Oaks M.D.   On: 06/09/2017 01:14   Dg Chest Port 1 View  Result Date: 06/10/2017 CLINICAL DATA:  Shortness of breath today. EXAM: PORTABLE CHEST 1 VIEW COMPARISON:  PA and lateral chest 06/08/2017 and CT chest 06/09/2017. FINDINGS: There is cardiomegaly without edema. No pneumothorax or pleural effusion. Atherosclerosis noted. No acute bony abnormality. IMPRESSION: Cardiomegaly without acute disease. Electronically Signed   By: Drusilla Kanner M.D.   On: 06/10/2017 20:29     Cardiac Studies   ECHO: 06/10/2017 - Left ventricle: The cavity size was moderately dilated. Wall   thickness was increased in a pattern of mild LVH. Systolic   function was moderately reduced. The estimated ejection fraction   was in the range of 35% to 40%. Diffuse hypokinesis. - Aortic valve: There was mild to moderate regurgitation. -  Mitral valve: There was mild regurgitation. Valve area by   pressure half-time: 2.02 cm^2. - Left atrium: The atrium was moderately dilated. - Atrial septum: There was a patent foramen ovale. - Pulmonary arteries: PA peak pressure: 31 mm Hg (S).  Patient Profile     61 y.o. female w/ hx atrial fib/flutter w/ ablation 05/29/2017, non-obs CAD w/ stress test at [facility] 01/2017 neg ischemia w/ EF 61%, SIADH, depression, COPD, tob use, HTN, anticoag w/ Xarelto, D-CHF, LBBB, was admitted 08/21 w/ CHF exacerbation   Assessment & Plan    Principal Problem: 1.  Acute on chronic diastolic (congestive) heart failure (HCC) - pt has now been changed to po Lasix>>agree - sx much improved - EF lower on echo above, MD review and advise if this could be related to the ablation or if ischemic eval needed. Pt w/ hx 2 caths, none recently, never had PCI  2.  Hx Atrial fib s/p ablation - maintaining SR on Coreg 12.5 bid, Dilt 120 mg and amio 200 mg qd - however, Coreg held this am 2nd bradycardia - discuss with MD if  she needs to stay on amio - will decrease Coreg dose w/ parameters  3. Anticoag - compliant w/ Xarelto - H&H are lower than previous - However, she had extensive bruising (not completely resolved) after the ablation - continue to follow  Otherwise, per IM Active Problems:   Hypertensive heart disease with heart failure (HCC)   AF (atrial fibrillation) (HCC)   Acute CHF (congestive heart failure) (HCC)   Acute pulmonary edema (HCC)   Acute combined systolic and diastolic heart failure (HCC)    Signed, Theodore Demark , PA-C 10:20 AM 06/11/2017 Pager: [phone]

## 2017-06-12 DIAGNOSIS — I420 Dilated cardiomyopathy: Secondary | ICD-10-CM

## 2017-06-12 DIAGNOSIS — R0602 Shortness of breath: Secondary | ICD-10-CM

## 2017-06-12 LAB — CBC WITH DIFFERENTIAL/PLATELET
Basophils Absolute: 0 10*3/uL (ref 0.0–0.1)
Basophils Relative: 1 %
EOS ABS: 0.1 10*3/uL (ref 0.0–0.7)
Eosinophils Relative: 2 %
HEMATOCRIT: 36.9 % (ref 36.0–46.0)
HEMOGLOBIN: 11.9 g/dL — AB (ref 12.0–15.0)
LYMPHS ABS: 1.3 10*3/uL (ref 0.7–4.0)
LYMPHS PCT: 16 %
MCH: 28.5 pg (ref 26.0–34.0)
MCHC: 32.2 g/dL (ref 30.0–36.0)
MCV: 88.5 fL (ref 78.0–100.0)
MONOS PCT: 12 %
Monocytes Absolute: 1 10*3/uL (ref 0.1–1.0)
NEUTROS PCT: 69 %
Neutro Abs: 5.6 10*3/uL (ref 1.7–7.7)
Platelets: 402 10*3/uL — ABNORMAL HIGH (ref 150–400)
RBC: 4.17 MIL/uL (ref 3.87–5.11)
RDW: 13.6 % (ref 11.5–15.5)
WBC: 8.1 10*3/uL (ref 4.0–10.5)

## 2017-06-12 LAB — HEPATIC FUNCTION PANEL
ALK PHOS: 66 U/L (ref 38–126)
ALT: 85 U/L — AB (ref 14–54)
AST: 66 U/L — AB (ref 15–41)
Albumin: 3.4 g/dL — ABNORMAL LOW (ref 3.5–5.0)
BILIRUBIN TOTAL: 0.6 mg/dL (ref 0.3–1.2)
Total Protein: 6.1 g/dL — ABNORMAL LOW (ref 6.5–8.1)

## 2017-06-12 LAB — TSH: TSH: 2.906 u[IU]/mL (ref 0.350–4.500)

## 2017-06-12 LAB — BASIC METABOLIC PANEL
Anion gap: 9 (ref 5–15)
BUN: 11 mg/dL (ref 6–20)
CHLORIDE: 97 mmol/L — AB (ref 101–111)
CO2: 28 mmol/L (ref 22–32)
Calcium: 8.9 mg/dL (ref 8.9–10.3)
Creatinine, Ser: 0.57 mg/dL (ref 0.44–1.00)
GFR calc Af Amer: >60 mL/min (ref >60)
GFR calc non Af Amer: >60 mL/min (ref >60)
Glucose, Bld: 97 mg/dL (ref 65–99)
POTASSIUM: 3.9 mmol/L (ref 3.5–5.1)
SODIUM: 134 mmol/L — AB (ref 135–145)

## 2017-06-12 MED ORDER — ATORVASTATIN CALCIUM 40 MG PO TABS: 40.0000 mg | ORAL_TABLET | Freq: Every day | ORAL | Status: DC

## 2017-06-12 MED ORDER — LIVING BETTER WITH HEART FAILURE BOOK
Freq: Once | Status: AC
  Administered 2017-06-12: 1

## 2017-06-12 MED ORDER — ROSUVASTATIN CALCIUM 10 MG PO TABS
10.0000 mg | ORAL_TABLET | Freq: Every day | ORAL | Status: DC
  Administered 2017-06-12: 10 mg via ORAL
  Filled 2017-06-12: qty 1

## 2017-06-12 MED ORDER — SPIRONOLACTONE 25 MG PO TABS
12.5000 mg | ORAL_TABLET | Freq: Every day | ORAL | Status: DC
  Administered 2017-06-12 – 2017-06-13 (×2): 12.5 mg via ORAL
  Filled 2017-06-12 (×2): qty 1

## 2017-06-12 MED ORDER — "THROMBI-PAD 3""X3"" EX PADS"
1.0000 | MEDICATED_PAD | Freq: Once | CUTANEOUS | Status: AC
  Administered 2017-06-12: 1 via TOPICAL
  Filled 2017-06-12: qty 1

## 2017-06-12 NOTE — Plan of Care (Signed)
Problem: Food- and Nutrition-Related Knowledge Deficit (NB-1.1) Goal: Nutrition education Formal process to instruct or train a patient/client in a skill or to impart knowledge to help patients/clients voluntarily manage or modify food choices and eating behavior to maintain or improve health. Outcome: Adequate for Discharge Nutrition Education Note  RD consulted for nutrition education regarding new onset CHF.  Spoke with pt, who reports some dietary indiscretion. She shares that she was overconsuming fluids such as soda and using salt in her cooking, which she suspects partly exacerbated her fluid overload. Pt typically consumes 2 meals per day. Diet recall revealed consuming of high sodium items such as ham and pizza, however, pt also made good choices such as dried beans and salad. Most of visit was spent reviewing diet recall and discussing ways pt could further decrease sodium in her diet.   Pt is committed to more through self-management and verbalized intent to weigh daily.   RD provided "Low Sodium Nutrition Therapy" handout from the Academy of [employer]. Reviewed patient's dietary recall. Provided examples on ways to decrease sodium intake in diet. Discouraged intake of processed foods and use of salt shaker. Encouraged fresh fruits and vegetables as well as whole grain sources of carbohydrates to maximize fiber intake.   RD discussed why it is important for patient to adhere to diet recommendations, and emphasized the role of fluids, foods to avoid, and importance of weighing self daily. Teach back method used.  Expect fair to good compliance.  Body mass index is 24.43 kg/m. Pt meets criteria for normal weight range based on current BMI.  Current diet order is Heart Healthy, patient is consuming approximately 100% of meals at this time. Labs and medications reviewed. No further nutrition interventions warranted at this time. RD contact information provided. If additional  nutrition issues arise, please re-consult RD.   Jennifer Martin, RD, LDN, CDE Pager: [phone] After hours Pager: [phone]

## 2017-06-12 NOTE — Progress Notes (Signed)
Progress Note  Patient Name: Jennifer Martin Date of Encounter: 06/12/2017  Primary Cardiologist: Dr. [name]  Subjective   No further SOB. No recent CP. Feeling a little weak today - not quite ready to go home.   Inpatient Medications    Scheduled Meds: . amiodarone  200 mg Oral Daily  . atorvastatin  40 mg Oral q1800  . escitalopram  5 mg Oral Daily  . Living Better with Heart Failure Book   Does not apply Once  . losartan  25 mg Oral Daily  . mometasone-formoterol  2 puff Inhalation BID  . pantoprazole  40 mg Oral Daily  . rivaroxaban  20 mg Oral QAC supper  . sodium chloride flush  3 mL Intravenous Q12H  . tiotropium  18 mcg Inhalation Daily   Continuous Infusions: . sodium chloride     PRN Meds: sodium chloride, acetaminophen, albuterol, fluticasone, LORazepam, ondansetron (ZOFRAN) IV, sodium chloride flush   Vital Signs    Vitals:   06/11/17 1238 06/11/17 1957 06/11/17 2009 06/12/17 0559  BP: 136/77  140/76 125/71  Pulse: (!) 55  64 61  Resp: 18  18 18   Temp: 97.8 F (36.6 C)  97.7 F (36.5 C) 98 F (36.7 C)  TempSrc: Oral  Oral Oral  SpO2: 100% 97% 98% 98%  Weight:    125 lb 1.6 oz (56.7 kg)  Height:        Intake/Output Summary (Last 24 hours) at 06/12/17 0945 Last data filed at 06/12/17 0600  Gross per 24 hour  Intake              240 ml  Output             2200 ml  Net            -1960 ml   Filed Weights   06/10/17 0428 06/11/17 0600 06/12/17 0559  Weight: 126 lb 4.8 oz (57.3 kg) 126 lb 9.6 oz (57.4 kg) 125 lb 1.6 oz (56.7 kg)    Telemetry    NSR/SB - Personally Reviewed  Physical Exam   GEN: No acute distress.  HEENT: Normocephalic, atraumatic, sclera non-icteric. Neck: No JVD or bruits. Cardiac: RRR no murmurs, rubs, or gallops.  Radials/DP/PT 1+ and equal bilaterally.  Respiratory: Clear to auscultation bilaterally. Breathing is unlabored. GI: Soft, nontender, non-distended, BS +x 4. MS: no deformity. Extremities: No clubbing or  cyanosis. No edema. Distal pedal pulses are 2+ and equal bilaterally. Thigh ecchymosis (which pt states happened post ablation) appearing in the middle stages of resolution Neuro:  AAOx3. Follows commands. Psych:  Responds to questions appropriately with a normal affect.  Labs    Chemistry Recent Labs Lab 06/10/17 0428 06/11/17 0450 06/12/17 0743  NA 135 131* 134*  K 2.9* 4.2 3.9  CL 96* 98* 97*  CO2 31 24 28   GLUCOSE 96 92 97  BUN 6 8 11   CREATININE 0.50 0.49 0.57  CALCIUM 8.7* 8.6* 8.9  GFRNONAA >60 >60 >60  GFRAA >60 >60 >60  ANIONGAP 8 9 9      Hematology Recent Labs Lab 06/08/17 2252 06/12/17 0820  WBC 9.6 8.1  RBC 3.64* 4.17  HGB 10.6* 11.9*  HCT 32.7* 36.9  MCV 89.8 88.5  MCH 29.1 28.5  MCHC 32.4 32.2  RDW 13.8 13.6  PLT 348 402*     Recent Labs Lab 06/08/17 2314  TROPIPOC 0.04     BNP Recent Labs Lab 06/08/17 2252  BNP 739.2*  DDimer  Recent Labs Lab 06/08/17 2252  DDIMER 1.45*     Radiology    Dg Chest Port 1 View  Result Date: 06/10/2017 CLINICAL DATA:  Shortness of breath today. EXAM: PORTABLE CHEST 1 VIEW COMPARISON:  PA and lateral chest 06/08/2017 and CT chest 06/09/2017. FINDINGS: There is cardiomegaly without edema. No pneumothorax or pleural effusion. Atherosclerosis noted. No acute bony abnormality. IMPRESSION: Cardiomegaly without acute disease. Electronically Signed   By: Drusilla Kanner M.D.   On: 06/10/2017 20:29    Cardiac Studies   2D echo 06/10/17 ------------------------------------------------------------------- Study Conclusions  - Left ventricle: The cavity size was moderately dilated. Wall   thickness was increased in a pattern of mild LVH. Systolic   function was moderately reduced. The estimated ejection fraction   was in the range of 35% to 40%. Diffuse hypokinesis. - Aortic valve: There was mild to moderate regurgitation. - Mitral valve: There was mild regurgitation. Valve area by   pressure  half-time: 2.02 cm^2. - Left atrium: The atrium was moderately dilated. - Atrial septum: There was a patent foramen ovale. - Pulmonary arteries: PA peak pressure: 31 mm Hg (S).  Patient Profile     61 y.o. female with CAD (CTO of Cx and otherwise mild CAD per Dr. Hulen Shouts note in 2017, low risk nuc with EF 61% 01/2017, 90th percentile calcium score 05/2017), LV dysfunction EF 45-50% in 09/2016 (61% by nuc 01/2017), LBBB, COPD, paroxysmal atrial fibrillation/atrial flutter s/p ablation 05/29/17, SIADH, HTN, HLD, depression who was admitted with CHF.  Assessment & Plan    1. Acute on chronic combined CHF with mild-moderate AI and mild MR - varying EF in the past - 45-50% in 09/2016, 61% by nuc 01/2017 then 30-35% this admission (40% by review from Dr. Mayford Knife). Etiology of decreased LVEF not totally clear. Question due to recent increase in AF/AFL versus possibility of progressive CAD. She denies any recent angina. She improved significantly with diuresis and is back to her baseline weight. She appears euvolemic on exam. Coreg was stopped due to AV block. She was started on losartan with an eye towards eventually transitioning to [facility] as BP allows. I will hold Lasix and discuss dosing with Dr. Mayford Knife - since we are unable to optimize HF regimen with BB, may consider transitioning diuretic to spironolactone instead. Will discuss definitive thoughts re: ischemic w/u with Dr. Mayford Knife. F/u BMET in AM. Rx living better with HF book. Will ask nutrition to see to provide their excellent review of CHF diet recs. She feels "weak" today - will order PT eval. She does not feel steady enough for DC today. Will need close OP f/u after DC.  2. Paroxysmal atrial fib/flutter s/p recent ablation, on amiodarone - on Xarelto. Dose is currently appropriate. BB stopped 2/2 bradycardia. Add TSH and LFTs to have for comparison in our system. Once cardiac status stabilized will need outpatient PFTs and serial monitoring given  chronic amio use.  3. LBBB and type 1 second degree AVB/bradycardia - no further type 2 AV block off carvedilol. Check TSH.  4. CAD - CTO of Cx as above. Patient reports being on Plavix ever since her first heart cath which she reports was many many years ago. She denies any prior h/o stenting. I am not convinced has a current indication for both Plavix in addition to Xarelto. She also reports very easy bruising including thigh hematomas after ablation.   5. Hyperlipidemia - written to start atorvastatin tonight but patient verbalized concerns over prior  intolerance down to 10mg  daily. She did previously tolerate simvastatin. This is less ideal given her CAD and drug interactions. Will try a trial of Crestor 10mg  daily instead.  Signed, Laurann Montana, PA-C  06/12/2017, 9:45 AM

## 2017-06-12 NOTE — Evaluation (Signed)
Physical Therapy Evaluation Patient Details Name: Jennifer Martin MRN: [id number] DOB: 1956-05-24 Today's Date: 06/12/2017   History of Present Illness  pt is a 61 y/o female with pmh significant for diastolic hypertensive CHF, AFIB, ablation procedure 10 days ago and back in NSR, presenting to the ED with SOB and near syncope.  Clinical Impression  Pt is at or close to baseline functioning and should be safe at home with available assist. There are no further acute PT needs.  Will sign off at this time.     Follow Up Recommendations No PT follow up    Equipment Recommendations  None recommended by PT    Recommendations for Other Services       Precautions / Restrictions        Mobility  Bed Mobility                  Transfers Overall transfer level: Modified independent                  Ambulation/Gait Ambulation/Gait assistance: Independent Ambulation Distance (Feet): 400 Feet   Gait Pattern/deviations: Step-through pattern   Gait velocity interpretation: at or above normal speed for age/gender General Gait Details: steady with over deviation even challenged in DGI  Stairs Stairs: Yes   Stair Management: One rail Right;Alternating pattern;Forwards Number of Stairs: 5 General stair comments: safe with rail  Wheelchair Mobility    Modified Rankin (Stroke Patients Only)       Balance                                 Standardized Balance Assessment Standardized Balance Assessment : Dynamic Gait Index   Dynamic Gait Index Level Surface: Normal Change in Gait Speed: Normal Gait with Horizontal Head Turns: Mild Impairment Gait with Vertical Head Turns: Normal Gait and Pivot Turn: Normal Step Over Obstacle: Normal Step Around Obstacles: Normal Steps: Mild Impairment Total Score: 22       Pertinent Vitals/Pain Pain Assessment: No/denies pain    Home Living                        Prior Function                Hand Dominance        Extremity/Trunk Assessment                Communication      Cognition Arousal/Alertness: Awake/alert Behavior During Therapy: WFL for tasks assessed/performed Overall Cognitive Status: Within Functional Limits for tasks assessed                                        General Comments General comments (skin integrity, edema, etc.): sats on RA ambulating at 96% and EHR 85 bpm    Exercises     Assessment/Plan    PT Assessment    PT Problem List         PT Treatment Interventions      PT Goals (Current goals can be found in the Care Plan section)  Acute Rehab PT Goals Patient Stated Goal: home, independent and able to do more.    Frequency     Barriers to discharge        Co-evaluation  AM-PAC PT "6 Clicks" Daily Activity  Outcome Measure Difficulty turning over in bed (including adjusting bedclothes, sheets and blankets)?: None Difficulty moving from lying on back to sitting on the side of the bed? : None Difficulty sitting down on and standing up from a chair with arms (e.g., wheelchair, bedside commode, etc,.)?: None Help needed moving to and from a bed to chair (including a wheelchair)?: None Help needed walking in hospital room?: None Help needed climbing 3-5 steps with a railing? : A Little 6 Click Score: 23    End of Session   Activity Tolerance: Patient tolerated treatment well Patient left: with call bell/phone within reach;Other (comment) (sitting EOB) Nurse Communication: Mobility status PT Visit Diagnosis: Other abnormalities of gait and mobility (R26.89)    Time: 1610-9604 PT Time Calculation (min) (ACUTE ONLY): 14 min   Charges:   PT Evaluation $PT Eval Low Complexity: 1 Low     PT G Codes:        2017-07-07   Bing, PT [phone]  (pager)  Eliseo Gum Tommy Goostree 07/07/2017, 4:53 PM

## 2017-06-12 NOTE — Care Management Important Message (Signed)
Important Message  Patient Details  Name: Jennifer Martin MRN: [id number] Date of Birth: November 10, 1955   Medicare Important Message Given:  Yes    Kyla Balzarine 06/12/2017, 10:37 AM

## 2017-06-12 NOTE — Progress Notes (Signed)
Benefit check for [facility]   RE: Benefit check  Received: Today  Message Contents  Inocencio Homes, Vernie Ammons, RN        Elwin Sleight does need prior authorization through [facility].  No deductible. There is no extra help. The patient may uses [facility] which is [facility] in [place] unless the drug is classified as a Tier 5. The patient will pay 35% cost of drug which will be Brand name.

## 2017-06-12 NOTE — Progress Notes (Signed)
PROGRESS NOTE    Jennifer Martin  ZSM:[id number] DOB: [birth year] DOA: 06/08/2017 PCP: [name]., MD    Brief Narrative:  Jennifer Martin is a 61 y.o. female with medical history significant of diastolic hypertensive CHF, A.Fib, just underwent ablation procedure x 10 days ago.  This successfully converted her to NSR.  Of note they did have to ablate around all 4 pulmonary veins during that procedure according to discharge summary.  Patient presents to the ED with c/o SOB and near syncope.  2 day history of cough productive of pink tinged sputum.  Abdominal discomfort.  No weight gain nor peripheral edema. Chest x-ray and CT angiogram chest consistent with pulmonary edema.  2-D echo done showed a depressed EF of 35-40%. Patient also noted to have a type I second-degree AV block with left bundle branch block. Cardiac medications adjusted per cardiology.    Assessment & Plan:   Principal Problem:   Acute on chronic diastolic (congestive) heart failure (HCC) Active Problems:   Hypertensive heart disease with heart failure (HCC)   AF (atrial fibrillation) (HCC)   Acute CHF (congestive heart failure) (HCC)   Acute pulmonary edema (HCC)   Acute combined systolic and diastolic heart failure (HCC)   Mobitz type 1 second degree AV block  #1 acute on chronic diastolic heart failure/coronary artery disease Patient s/p recent cathether ablation for atrial fibrillation 05/29/2017 who presented with acute heart failure. Point-of-care troponin 0.04. BNP elevated. Point-of-care troponin negative. Patient with a urine output of 2.2 L over the past 24 hours. Patient is -7.8 L during this hospitalization. Current weight is 125 pounds from 126 pounds from 132 pounds on admission.2-D echo with a EF of 35-40%, PFO with diffuse hypokinesis. Strict I's and O's. Daily weights.  Cardizem and Coreg discontinued secondary to bradycardia and depressed EF. Patient currently on oral Lasix. Patient currently on ARB  per cardiology with plans to transition to entresto and blood pressure remained stable. Hydralazine has been discontinued.Plavix has also been discontinued as patient on Xarelto an increased risk of bleeding.  #2 atrial fibrillation CHA2DS2VASC 3 Patient status post ablation 05/29/2017.  Patient's Cardizem has been discontinued due to the decreased ejection fraction. Coreg has also been discontinued secondary to bradycardia.Heart rate is controlled. Continue amiodarone. Continue Xarelto for anticoagulation. Cardiology following.  #3 COPD Currently stable. Continue dulera and spiriva.  #4 hypertension  Blood pressure is somewhat elevated.Continue Lasix, Cozaar Patient's Coreg and Cardizem were discontinued per cardiology due to bradycardia and decreased ejection fraction. Hydralazine has also been discontinued. Follow.Per cardiology.  #5 hypokalemia Due to diureses. Repleted.  #6 bradycardia/type I second-degree AV block with LBBB Bradycardia improved with medication adjustment per cardiology. Patient noted to have bradycardia in the 40s the evening of 06/10/2017. Telemetry was reviewed by cardiology which did show intermittent type I second-degree AV block with LBBB. Patient's LBBB is old. Per cardiology in discussions with the EP patient's Coreg has been discontinued. Cardizem also discontinued due to decreased ejection fraction. Per cardiology.     DVT prophylaxis: xarelto Code Status: Full Family Communication: updated patient and family at bedside. Disposition Plan: home once acute CHF exacerbation has resolved and per cardiology hopefully tomorrow.   Consultants:   Cardiology: Dr. Duke Salvia 06/10/2017  Procedures:   2-D echo 06/10/2017--- EF 35-40%, diffuse hypokinesis, mild-to-moderate aortic valve regurgitation, mild mitral valvular regurgitation, moderately dilated left atrium, patent foramen ovale.  Chest x-ray 06/08/2017  Antimicrobials:    none   Subjective: Patient sitting at the side  of the bed. No chest pain. No shortness of breath.  Objective: Vitals:   06/11/17 2009 06/12/17 0559 06/12/17 1143 06/12/17 1227  BP: 140/76 125/71  (!) 155/78  Pulse: 64 61  (!) 58  Resp: 18 18  20   Temp: 97.7 F (36.5 C) 98 F (36.7 C)  97.7 F (36.5 C)  TempSrc: Oral Oral  Oral  SpO2: 98% 98% 97% 99%  Weight:  56.7 kg (125 lb 1.6 oz)    Height:        Intake/Output Summary (Last 24 hours) at 06/12/17 1308 Last data filed at 06/12/17 1228  Gross per 24 hour  Intake              720 ml  Output             1700 ml  Net             -980 ml   Filed Weights   06/10/17 0428 06/11/17 0600 06/12/17 0559  Weight: 57.3 kg (126 lb 4.8 oz) 57.4 kg (126 lb 9.6 oz) 56.7 kg (125 lb 1.6 oz)    Examination:  General exam: NAD. Respiratory system: Clear to auscultation. No wheezing. No rhonchi. No crackles. Respiratory effort normal. Cardiovascular system: S1 & S2 heard, RRR. No JVD, murmurs, rubs, gallops or clicks. No pedal edema. Gastrointestinal system: Abdomen is soft, nontender, nondistended, positive bo sounds.  Central nervous system: Alert and oriented3. No focal neurological deficits. Extremities: Symmetric 5 x 5 power. Skin: No rashes, lesions or ulcers Psychiatry: Judgement and insight appear normal. Mood & affect appropriate.     Data Reviewed: I have personally reviewed following labs and imaging studies  CBC:  Recent Labs Lab 06/08/17 2252 06/12/17 0820  WBC 9.6 8.1  NEUTROABS  --  5.6  HGB 10.6* 11.9*  HCT 32.7* 36.9  MCV 89.8 88.5  PLT 348 402*   Basic Metabolic Panel:  Recent Labs Lab 06/08/17 2252 06/10/17 0428 06/11/17 0450 06/12/17 0743  NA 135 135 131* 134*  K 3.8 2.9* 4.2 3.9  CL 98* 96* 98* 97*  CO2 26 31 24 28   GLUCOSE 115* 96 92 97  BUN 8 6 8 11   CREATININE 0.57 0.50 0.49 0.57  CALCIUM 8.7* 8.7* 8.6* 8.9  MG  --  2.1  --   --    GFR: Estimated Creatinine Clearance: 57.6  mL/min (by C-G formula based on SCr of 0.57 mg/dL). Liver Function Tests:  Recent Labs Lab 06/12/17 1000  AST 66*  ALT 85*  ALKPHOS 66  BILITOT 0.6  PROT 6.1*  ALBUMIN 3.4*   No results for input(s): LIPASE, AMYLASE in the last 168 hours. No results for input(s): AMMONIA in the last 168 hours. Coagulation Profile: No results for input(s): INR, PROTIME in the last 168 hours. Cardiac Enzymes: No results for input(s): CKTOTAL, CKMB, CKMBINDEX, TROPONINI in the last 168 hours. BNP (last 3 results)  Recent Labs  04/20/17 1133  PROBNP 795*   HbA1C: No results for input(s): HGBA1C in the last 72 hours. CBG:  Recent Labs Lab 06/08/17 2248  GLUCAP 122*   Lipid Profile:  Recent Labs  06/11/17 1439  CHOL 179  HDL 56  LDLCALC 103*  TRIG 100  CHOLHDL 3.2   Thyroid Function Tests:  Recent Labs  06/12/17 1110  TSH 2.906   Anemia Panel: No results for input(s): VITAMINB12, FOLATE, FERRITIN, TIBC, IRON, RETICCTPCT in the last 72 hours. Sepsis Labs: No results for input(s): PROCALCITON, LATICACIDVEN in the  last 168 hours.  No results found for this or any previous visit (from the past 240 hour(s)).       Radiology Studies: Dg Chest Port 1 View  Result Date: 06/10/2017 CLINICAL DATA:  Shortness of breath today. EXAM: PORTABLE CHEST 1 VIEW COMPARISON:  PA and lateral chest 06/08/2017 and CT chest 06/09/2017. FINDINGS: There is cardiomegaly without edema. No pneumothorax or pleural effusion. Atherosclerosis noted. No acute bony abnormality. IMPRESSION: Cardiomegaly without acute disease. Electronically Signed   By: Drusilla Kanner M.D.   On: 06/10/2017 20:29        Scheduled Meds: . amiodarone  200 mg Oral Daily  . escitalopram  5 mg Oral Daily  . losartan  25 mg Oral Daily  . mometasone-formoterol  2 puff Inhalation BID  . pantoprazole  40 mg Oral Daily  . rivaroxaban  20 mg Oral QAC supper  . rosuvastatin  10 mg Oral q1800  . sodium chloride flush  3 mL  Intravenous Q12H  . spironolactone  12.5 mg Oral Daily  . tiotropium  18 mcg Inhalation Daily   Continuous Infusions: . sodium chloride       LOS: 3 days    Time spent: 35 minutes    Skeeter Sheard, MD [facility] Pager [phone]  If 7PM-7AM, please contact night-coverage [email or web address] Password [email or web address] 06/12/2017, 1:08 PM

## 2017-06-13 DIAGNOSIS — R0602 Shortness of breath: Secondary | ICD-10-CM

## 2017-06-13 DIAGNOSIS — I5033 Acute on chronic diastolic (congestive) heart failure: Secondary | ICD-10-CM

## 2017-06-13 LAB — BASIC METABOLIC PANEL
Anion gap: 10 (ref 5–15)
BUN: 11 mg/dL (ref 6–20)
CHLORIDE: 96 mmol/L — AB (ref 101–111)
CO2: 26 mmol/L (ref 22–32)
Calcium: 8.8 mg/dL — ABNORMAL LOW (ref 8.9–10.3)
Creatinine, Ser: 0.59 mg/dL (ref 0.44–1.00)
GFR calc Af Amer: >60 mL/min (ref >60)
GFR calc non Af Amer: >60 mL/min (ref >60)
Glucose, Bld: 94 mg/dL (ref 65–99)
POTASSIUM: 4 mmol/L (ref 3.5–5.1)
SODIUM: 132 mmol/L — AB (ref 135–145)

## 2017-06-13 LAB — CBC
HEMATOCRIT: 35.6 % — AB (ref 36.0–46.0)
Hemoglobin: 11.9 g/dL — ABNORMAL LOW (ref 12.0–15.0)
MCH: 29.9 pg (ref 26.0–34.0)
MCHC: 33.4 g/dL (ref 30.0–36.0)
MCV: 89.4 fL (ref 78.0–100.0)
Platelets: 401 10*3/uL — ABNORMAL HIGH (ref 150–400)
RBC: 3.98 MIL/uL (ref 3.87–5.11)
RDW: 13.8 % (ref 11.5–15.5)
WBC: 7 10*3/uL (ref 4.0–10.5)

## 2017-06-13 MED ORDER — FUROSEMIDE 20 MG PO TABS: 20.0000 mg | ORAL_TABLET | Freq: Every day | ORAL | Status: DC

## 2017-06-13 MED ORDER — LOSARTAN POTASSIUM 25 MG PO TABS: 25.0000 mg | ORAL_TABLET | Freq: Every day | ORAL | 0 refills | Status: DC

## 2017-06-13 MED ORDER — FUROSEMIDE 20 MG PO TABS: 20.0000 mg | ORAL_TABLET | Freq: Every day | ORAL | 0 refills | Status: DC

## 2017-06-13 MED ORDER — ROSUVASTATIN CALCIUM 10 MG PO TABS: 10.0000 mg | ORAL_TABLET | Freq: Every day | ORAL | 0 refills | Status: DC

## 2017-06-13 MED ORDER — MOMETASONE FURO-FORMOTEROL FUM 200-5 MCG/ACT IN AERO: 2.0000 | INHALATION_SPRAY | Freq: Two times a day (BID) | RESPIRATORY_TRACT | 0 refills | Status: DC

## 2017-06-13 NOTE — Care Management Note (Signed)
Case Management Note  Patient Details  Name: Jennifer Martin MRN: [id number] Date of Birth: 01/18/1956  Subjective/Objective:                 Patient with order to DC to home today. Chart reviewed. No Home Health or Equipment needs, no unacknowledged Case Management consults or medication needs identified at the time of this note. Plan for DC to home. If needs arise today prior to discharge, please call Lawerance Sabal RN CM at [phone].    Action/Plan:   Expected Discharge Date:  06/13/17               Expected Discharge Plan:  Home/Self Care  In-House Referral:     Discharge planning Services  CM Consult  Post Acute Care Choice:    Choice offered to:     DME Arranged:    DME Agency:     HH Arranged:    HH Agency:     Status of Service:  Completed, signed off  If discussed at [employer] of Stay Meetings, dates discussed:    Additional Comments:  Lawerance Sabal, RN 06/13/2017, 12:28 PM

## 2017-06-13 NOTE — Progress Notes (Addendum)
Progress Note  Patient Name: Jennifer Martin Date of Encounter: 06/13/2017  Primary Cardiologist: Dr. Mathis Bud  Subjective   Feeling much better today  Inpatient Medications    Scheduled Meds: . amiodarone  200 mg Oral Daily  . escitalopram  5 mg Oral Daily  . losartan  25 mg Oral Daily  . mometasone-formoterol  2 puff Inhalation BID  . pantoprazole  40 mg Oral Daily  . rivaroxaban  20 mg Oral QAC supper  . rosuvastatin  10 mg Oral q1800  . sodium chloride flush  3 mL Intravenous Q12H  . spironolactone  12.5 mg Oral Daily  . tiotropium  18 mcg Inhalation Daily   Continuous Infusions: . sodium chloride     PRN Meds: sodium chloride, acetaminophen, albuterol, fluticasone, LORazepam, ondansetron (ZOFRAN) IV, sodium chloride flush   Vital Signs    Vitals:   06/12/17 2048 06/13/17 0523 06/13/17 0818 06/13/17 1116  BP:  122/61  (!) 141/85  Pulse:  (!) 58  (!) 59  Resp:  19  20  Temp:  98.2 F (36.8 C)  97.7 F (36.5 C)  TempSrc:  Oral  Oral  SpO2: 97% 98%  97%  Weight:   125 lb 12.8 oz (57.1 kg)   Height:        Intake/Output Summary (Last 24 hours) at 06/13/17 1120 Last data filed at 06/13/17 1116  Gross per 24 hour  Intake             1560 ml  Output             3050 ml  Net            -1490 ml   Filed Weights   06/11/17 0600 06/12/17 0559 06/13/17 0818  Weight: 126 lb 9.6 oz (57.4 kg) 125 lb 1.6 oz (56.7 kg) 125 lb 12.8 oz (57.1 kg)    Telemetry    NSR - Personally Reviewed  ECG    No new EKG - Personally Reviewed  Physical Exam   GEN: No acute distress.   Neck: No JVD Cardiac: RRR, no murmurs, rubs, or gallops.  Respiratory: Clear to auscultation bilaterally. GI: Soft, nontender, non-distended  MS: No edema; No deformity. Neuro:  Nonfocal  Psych: Normal affect   Labs    Chemistry Recent Labs Lab 06/11/17 0450 06/12/17 0743 06/12/17 1000 06/13/17 0445  NA 131* 134*  --  132*  K 4.2 3.9  --  4.0  CL 98* 97*  --  96*  CO2 24 28  --   26  GLUCOSE 92 97  --  94  BUN 8 11  --  11  CREATININE 0.49 0.57  --  0.59  CALCIUM 8.6* 8.9  --  8.8*  PROT  --   --  6.1*  --   ALBUMIN  --   --  3.4*  --   AST  --   --  66*  --   ALT  --   --  85*  --   ALKPHOS  --   --  66  --   BILITOT  --   --  0.6  --   GFRNONAA >60 >60  --  >60  GFRAA >60 >60  --  >60  ANIONGAP 9 9  --  10     Hematology Recent Labs Lab 06/08/17 2252 06/12/17 0820 06/13/17 0445  WBC 9.6 8.1 7.0  RBC 3.64* 4.17 3.98  HGB 10.6* 11.9* 11.9*  HCT 32.7* 36.9  35.6*  MCV 89.8 88.5 89.4  MCH 29.1 28.5 29.9  MCHC 32.4 32.2 33.4  RDW 13.8 13.6 13.8  PLT 348 402* 401*    Cardiac EnzymesNo results for input(s): TROPONINI in the last 168 hours.  Recent Labs Lab 06/08/17 2314  TROPIPOC 0.04     BNP Recent Labs Lab 06/08/17 2252  BNP 739.2*     DDimer  Recent Labs Lab 06/08/17 2252  DDIMER 1.45*     Radiology    No results found.  Cardiac Studies   2D echo 06/10/17 ------------------------------------------------------------------- Study Conclusions  - Left ventricle: The cavity size was moderately dilated. Wall thickness was increased in a pattern of mild LVH. Systolic function was moderately reduced. The estimated ejection fraction was in the range of 35% to 40%. Diffuse hypokinesis. - Aortic valve: There was mild to moderate regurgitation. - Mitral valve: There was mild regurgitation. Valve area by pressure half-time: 2.02 cm^2. - Left atrium: The atrium was moderately dilated. - Atrial septum: There was a patent foramen ovale. - Pulmonary arteries: PA peak pressure: 31 mm Hg (S).   Patient Profile     61 y.o. female with CAD (CTO of Cx and otherwise mild CAD per Dr. Hulen Shouts note in 2017, low risk nuc with EF 61% 01/2017, 90th percentile calcium score 05/2017), LV dysfunction EF 45-50% in 09/2016 (61% by nuc 01/2017), LBBB, COPD, paroxysmal atrial fibrillation/atrial flutter s/p ablation 05/29/17, SIADH, HTN, HLD,  depression who was admitted with CHF.  Assessment & Plan    1. Acute on chronic combined CHF with mild-moderate AI and mild MR - varying EF in the past - 45-50% in 09/2016, 37% by echo 08/2016,  61% by nuc 01/2017 then 30-35% this admission (40% by review from Dr. Mayford Knife). Etiology not totally clear. Question due to recent increase in AF/AFL versus possibility of progressive CAD. She denies any recent angina. She improved significantly with diuresis and is back to her baseline weight. She appears euvolemic on exam. She put out 2.5L yesterday and is net neg 9.4L.  - coreg stopped due to AV block.  - continue low dose ARB with plan to change to Entresto at followup - continue aldactone 12.5mg  daily - creatinine 0.59 and K+4 - will start lasix 20mg  daily and check BMET in 1 week - I instructed her to weight herself daily and if weight increased by 3lbs in 1 day to take an extra lasix and if weight does not improve then call our office  2. Paroxysmal atrial fib/flutter s/p recent ablation, on amiodarone  -  continue on Xarelto 20mg  daily  -  BB stopped 2/2 bradycardia.  -  Continue Amio 200mg  daily -  TSH is normal -  LFTs mildly elevated - will repeat as outpt in 1 week since starting statin as well -  Once cardiac status stabilized will need outpatient PFTs and serial monitoring given chronic amio use.  3. LBBB and type 1 second degree AVB/bradycardia  - no further type 2 AV block off carvedilol. -  TSH is normal.  4. CAD - CTO of Cx as above. Patient reports being on Plavix ever since her first heart cath which she reports was many many years ago. She denies any prior h/o stenting. I am not convinced has a current indication for both Plavix in addition to Xarelto. She also reports very easy bruising including thigh hematomas after ablation. Plavix stopped to reduce bleeding risk.  - continue statin  - no BB due to bradycardia -  no ASA due to NOAC  5. Hyperlipidemia -  She did previously  tolerate simvastatin but not on now and cannot use with Amio.Will try a trial of Crestor 10mg  daily instead. She will need FLP and ALT in 6 weeks.   She is stable from a cardiac standpoint for discharge.  We will set up TOC visit in 7-10 days.  Signed, Armanda Magic, MD  06/13/2017, 11:20 AM

## 2017-06-13 NOTE — Discharge Summary (Signed)
Physician Discharge Summary  Jennifer Martin ZOX:[id number] DOB: 1955/11/19 DOA: 06/08/2017  PCP: [name]., MD  Admit date: 06/08/2017 Discharge date: 06/13/2017  Time spent: 65 minutes  Recommendations for Outpatient Follow-up:  1. Follow up with [name]., MD in 2 weeks. On follow-up patient will need a basic metabolic profile done to follow-up on electrolytes and renal function. Patient COPD would need to be reassessed. Patient neither H&H done to follow-up on her hemoglobin. 2. Follow-up with Dr. Elberta Fortis, EP cardiology. Office will call with appointment time.   Discharge Diagnoses:  Principal Problem:   Acute on chronic diastolic (congestive) heart failure (HCC) Active Problems:   Hypertensive heart disease with heart failure (HCC)   AF (atrial fibrillation) (HCC)   Acute CHF (congestive heart failure) (HCC)   Acute pulmonary edema (HCC)   Acute combined systolic and diastolic heart failure (HCC)   Mobitz type 1 second degree AV block   SOB (shortness of breath) on exertion   Discharge Condition: Stable and improved  Diet recommendation: Heart healthy  Filed Weights   06/11/17 0600 06/12/17 0559 06/13/17 0818  Weight: 57.4 kg (126 lb 9.6 oz) 56.7 kg (125 lb 1.6 oz) 57.1 kg (125 lb 12.8 oz)    History of present illness:  Per Dr. Tresa Res is a 61 y.o. female with medical history significant of diastolic hypertensive CHF, A.Fib, just underwent ablation procedure x 10 days ago.  This successfully converted her to NSR.  Of note they did have to ablate around all 4 pulmonary veins during that procedure according to discharge summary.  Patient presented to the ED with c/o SOB and near syncope.  2 day history of cough productive of pink tinged sputum.  Abdominal discomfort.  No weight gain nor peripheral edema.   ED Course: CXR showed pulm edema, CTA chest showed no PE but also showed pulm edema.  Hospital Course:  #1 acute on chronic diastolic heart  failure/coronary artery disease Patient s/p recent cathether ablation for atrial fibrillation 05/29/2017 who presented with acute heart failure. Point-of-care troponin 0.04. BNP elevated. Point-of-care troponin negative. Patient was -9.370  L during this hospitalization. Weight on day of discharge was 125 pounds from 132 pounds on admission. 2-D echo with a EF of 35-40%, PFO with diffuse hypokinesis. Strict I's and O's. Daily weights.  Cardizem and Coreg discontinued secondary to bradycardia and depressed EF. Patient was diuresed aggressively with IV Lasix and subsequently transitioned to oral Lasix per cardiology. Patient placed on ARB per cardiology with plans to transition to entresto if blood pressure remained stable. Hydralazine has been discontinued.Plavix has also been discontinued as patient on Xarelto with increased risk of bleeding. Patient improved clinically was euvolemic by day of discharge. Outpatient follow-up with cardiology.  #2 atrial fibrillation [id number] 3 Patient status post ablation 05/29/2017.  Patient's Cardizem wasd discontinued due to the decreased ejection fraction. Coreg has also been discontinued secondary to bradycardia.Heart rate remained controlled on amiodarone. Xarelto for anticoagulation. Followed by cardiology throughout the hospitalization.  #3 COPD Patient maintained on dulera and spiriva. Patient has to be switched from Symbicort to [facility] due to cost. Outpatient follow-up with PCP.  #4 hypertension  Patient's Coreg and Cardizem were discontinued per cardiology due to bradycardia and decreased ejection fraction. Hydralazine has also been discontinued. Patient was maintained on Lasix and Cozaar was added for better blood pressure control. Per cardiology.  #5 hypokalemia Due to diureses. Repleted by day of discharge.  #6 bradycardia/type I second-degree AV  block with LBBB Patient noted to have episode of bradycardia during the hospitalization with heart  rate in the 40s the evening of 06/10/2017. Patient was followed by cardiology and medication adjustments were made. Bradycardia improved with medication adjustment per cardiology.  Telemetry was reviewed by cardiology which did show intermittent type I second-degree AV block with LBBB. Patient's LBBB was old. Per cardiology in discussions with the EP patient's Coreg has been discontinued. Cardizem also discontinued due to decreased ejection fraction. Per cardiology.     Procedures:  2-D echo 06/10/2017--- EF 35-40%, diffuse hypokinesis, mild-to-moderate aortic valve regurgitation, mild mitral valvular regurgitation, moderately dilated left atrium, patent foramen ovale.  Chest x-ray 06/08/2017   Consultations:  Cardiology: Dr. Duke Salvia 06/10/2017  Discharge Exam: Vitals:   06/13/17 0523 06/13/17 1116  BP: 122/61 (!) 141/85  Pulse: (!) 58 (!) 59  Resp: 19 20  Temp: 98.2 F (36.8 C) 97.7 F (36.5 C)  SpO2: 98% 97%    General: NAD Cardiovascular: RRR Respiratory: CTAB  Discharge Instructions   Discharge Instructions    Diet - low sodium heart healthy    Complete by:  As directed    Increase activity slowly    Complete by:  As directed      Current Discharge Medication List    START taking these medications   Details  losartan (COZAAR) 25 MG tablet Take 1 tablet (25 mg total) by mouth daily. Qty: 30 tablet, Refills: 0    mometasone-formoterol (DULERA) 200-5 MCG/ACT AERO Inhale 2 puffs into the lungs 2 (two) times daily. Qty: 13 g, Refills: 0    rosuvastatin (CRESTOR) 10 MG tablet Take 1 tablet (10 mg total) by mouth daily at 6 PM. Qty: 30 tablet, Refills: 0      CONTINUE these medications which have CHANGED   Details  furosemide (LASIX) 20 MG tablet Take 1 tablet (20 mg total) by mouth daily. FOR WEIGHT 131 OR GREATER Qty: 30 tablet, Refills: 0      CONTINUE these medications which have NOT CHANGED   Details  acetaminophen (TYLENOL) 500 MG tablet Take  1,000 mg by mouth 2 (two) times daily as needed for mild pain or headache.    albuterol (PROVENTIL HFA;VENTOLIN HFA) 108 (90 Base) MCG/ACT inhaler Inhale 2 puffs into the lungs every 6 (six) hours as needed for wheezing or shortness of breath.    amiodarone (PACERONE) 200 MG tablet Take 1 tablet (200 mg total) by mouth daily. Qty: 90 tablet, Refills: 3   Associated Diagnoses: PAF (paroxysmal atrial fibrillation) (HCC); Atrial flutter, unspecified type (HCC); Coronary artery disease of native artery of native heart with stable angina pectoris (HCC)    escitalopram (LEXAPRO) 10 MG tablet Take 5 mg by mouth daily.    LORazepam (ATIVAN) 1 MG tablet Take 1 mg by mouth every 8 (eight) hours as needed for anxiety.     nitroGLYCERIN (NITROSTAT) 0.4 MG SL tablet Place 0.4 mg under the tongue every 5 (five) minutes as needed for chest pain.    pantoprazole (PROTONIX) 40 MG tablet Take 40 mg by mouth daily.    rivaroxaban (XARELTO) 20 MG TABS tablet Take 1 tablet (20 mg total) by mouth daily. Qty: 90 tablet, Refills: 3   Associated Diagnoses: PAF (paroxysmal atrial fibrillation) (HCC); Atrial flutter, unspecified type (HCC); Coronary artery disease of native artery of native heart with stable angina pectoris (HCC)    fluticasone (FLONASE) 50 MCG/ACT nasal spray Place 2 sprays into both nostrils daily as needed for allergies.  STOP taking these medications     budesonide-formoterol (SYMBICORT) 160-4.5 MCG/ACT inhaler      carvedilol (COREG) 12.5 MG tablet      clopidogrel (PLAVIX) 75 MG tablet      diltiazem (CARDIZEM CD) 120 MG 24 hr capsule      hydrALAZINE (APRESOLINE) 25 MG tablet      potassium chloride SA (K-DUR,KLOR-CON) 20 MEQ tablet        Allergies  Allergen Reactions  . Atorvastatin     Myalgias even at 10mg  daily  . Cefdinir Diarrhea  . Levaquin [Levofloxacin In D5w] Diarrhea  . Morphine And Related Nausea Only   Follow-up Information    [name]., MD.  Schedule an appointment as soon as possible for a visit in 2 week(s).   Specialty:  Internal Medicine Contact information: [address] [place] [address] [phone]        Regan Lemming, MD Follow up.   Specialty:  Cardiology Why:  The office will call. Contact information: [address] [place] [address] [phone]            The results of significant diagnostics from this hospitalization (including imaging, microbiology, ancillary and laboratory) are listed below for reference.    Significant Diagnostic Studies: Dg Chest 2 View  Result Date: 06/08/2017 CLINICAL DATA:  Shortness of breath.  Near syncope. EXAM: CHEST  2 VIEW COMPARISON:  Limited CT chest May 27, 2017 FINDINGS: The cardiac silhouette is moderately enlarged, mediastinal silhouette is nonsuspicious. Diffuse interstitial prominence with Kerley B-lines. No pleural effusion or focal consolidation. No pneumothorax. Soft tissue planes and included osseous structures are nonsuspicious. IMPRESSION: Moderate cardiomegaly. Interstitial prominence most consistent with pulmonary edema. Electronically Signed   By: Awilda Metro M.D.   On: 06/08/2017 23:42   Ct Angio Chest Pe W And/or Wo Contrast  Result Date: 06/09/2017 CLINICAL DATA:  Shortness of breath and near syncope. Pulmonary embolus suspected, intermediate probability. Elevated D-dimer. Recent atrial fibrillation ablation. EXAM: CT ANGIOGRAPHY CHEST WITH CONTRAST TECHNIQUE: Multidetector CT imaging of the chest was performed using the standard protocol during bolus administration of intravenous contrast. Multiplanar CT image reconstructions and MIPs were obtained to evaluate the vascular anatomy. CONTRAST:  80 cc Isovue 370 IV COMPARISON:  Chest radiograph earlier this day.  Chest CT 05/27/2017 FINDINGS: Cardiovascular: There are no filling defects within the pulmonary arteries to suggest pulmonary embolus. Atherosclerosis of the  thoracic aorta. Coronary artery calcifications. The heart is enlarged with right and left heart dilatation. Mediastinum/Nodes: No mediastinal or hilar adenopathy. The esophagus is decompressed. No pericardial effusion. Visualized thyroid gland normal. Lungs/Pleura: Smooth septal thickening consistent with pulmonary edema, most prominent in the lower lobes. Superimposed lower lobe linear atelectasis. Unchanged smoothly marginated 9 mm right lower lobe pulmonary nodule abutting the major fissure. No new pulmonary nodule. Mild underlying emphysema and central bronchial thickening. No pleural fluid. Upper Abdomen: No acute abnormality. Musculoskeletal: There are no acute or suspicious osseous abnormalities. Review of the MIP images confirms the above findings. IMPRESSION: 1. No pulmonary embolus. 2. Cardiomegaly with pulmonary edema. 3. Emphysema with central bronchial thickening. 4. Aortic Atherosclerosis (ICD10-I70.0). Coronary artery calcifications. 5. Unchanged smoothly marginated 9 mm fissure based nodule in the superior segment of the right lower lobe, possible intrapulmonary lymph node. Follow-up recommendations as before. Electronically Signed   By: Rubye Oaks M.D.   On: 06/09/2017 01:14   Ct Cardiac Morph/pulm Vein W/cm&w/o Ca Score  Addendum Date: 05/27/2017   ADDENDUM  REPORT: 05/27/2017 19:23 CLINICAL DATA:  Atrial fibrillation scheduled for an ablation. EXAM: Cardiac CT/CTA TECHNIQUE: The patient was scanned on a [employer] scanner. FINDINGS: A 120 kV prospective scan was triggered in the descending thoracic aorta at 111 HU's. Gantry rotation speed was 250 msecs and collimation was .6 mm. No beta blockade and no NTG was given. The 3D data set was reconstructed in 5% intervals of the 60-80 % of the R-R cycle. Diastolic phases were analyzed on a dedicated work station using MPR, MIP and VRT modes. The patient received 80 cc of contrast. There is normal pulmonary vein drainage into the left atrium  (2 on the right and 1 on the left) with ostial measurements as follows: RUPV:  20 x 17 mm RLPV:  19 x 15 mm Left pulmonary trunk:  30 x 14 mm The left atrial appendage is large with chicken wing morphology and 1 major lobe. Ostial size 25 x 24 mm and length 45 mm. There is no thrombus in the left atrial appendage. The esophagus runs to the left from the left atrial midline and is in the proximity to the left pulmonary vein. Aorta:  Normal caliber.  No dissection or calcifications. Aortic Valve:  Trileaflet.  No calcifications. Coronary Arteries: Normal coronary origin. Right dominance. The study was performed without use of NTG and insufficient for plaque evaluation. IMPRESSION: 1. There is normal pulmonary vein drainage into the left atrium. No evidence for pulmonary vein stenosis. 2. The left atrial appendage is large with chicken wing morphology and 1 major lobe. Ostial size 25 x 24 mm and length 45 mm. There is no thrombus in the left atrial appendage. 3. The esophagus runs to the left from the left atrial midline and is in the proximity to the left pulmonary vein. 4. Calcium score 161, that represents 90 percentile age/sex. Normal coronary origin. Image quality insufficient for CAD evaluation. Tobias Alexander Electronically Signed   By: Tobias Alexander   On: 05/27/2017 19:23   Result Date: 05/27/2017 EXAM: OVER-READ INTERPRETATION  CT CHEST The following report is an over-read performed by radiologist Dr. Royal Piedra [facility] on 05/27/2017. This over-read does not include interpretation of cardiac or coronary anatomy or pathology. The coronary calcium score/coronary CTA interpretation by the cardiologist is attached. COMPARISON:  None. FINDINGS: Smoothly marginated nodule in the superior segment of the right lower lobe associated with the right major fissure (axial image 3 of series 11) measuring 10 x 8 mm (mean diameter of 9 mm). Aortic atherosclerosis. Scarring in the medial segment of  the right middle lobe and inferior segment of the lingula. Within the visualized portions of the thorax there is no acute consolidative airspace disease, no pleural effusions, no pneumothorax and no lymphadenopathy. Visualized portions of the upper abdomen are unremarkable. There are no aggressive appearing lytic or blastic lesions noted in the visualized portions of the skeleton. IMPRESSION: 1. Smoothly marginated subpleural nodule in the superior segment of the right lower lobe associated with the right major fissure mid with a mean diameter of 9 mm. Although this nodule is smoothly marginated, and favored to be benign, potentially a subpleural lymph node, repeat chest CT is recommended in 3 months to ensure the stability or resolution of this finding. This recommendation follows the consensus statement: Guidelines for Management of Incidental Pulmonary Nodules Detected on CT Images: From the Fleischner Society 2017; Radiology 2017; 284:228-243. 2. Aortic atherosclerosis. Aortic Atherosclerosis (ICD10-I70.0). Electronically Signed: By: Trudie Reed M.D. On: 05/27/2017 11:17  Dg Chest Port 1 View  Result Date: 06/10/2017 CLINICAL DATA:  Shortness of breath today. EXAM: PORTABLE CHEST 1 VIEW COMPARISON:  PA and lateral chest 06/08/2017 and CT chest 06/09/2017. FINDINGS: There is cardiomegaly without edema. No pneumothorax or pleural effusion. Atherosclerosis noted. No acute bony abnormality. IMPRESSION: Cardiomegaly without acute disease. Electronically Signed   By: Drusilla Kanner M.D.   On: 06/10/2017 20:29    Microbiology: No results found for this or any previous visit (from the past 240 hour(s)).   Labs: Basic Metabolic Panel:  Recent Labs Lab 06/08/17 2252 06/10/17 0428 06/11/17 0450 06/12/17 0743 06/13/17 0445  NA 135 135 131* 134* 132*  K 3.8 2.9* 4.2 3.9 4.0  CL 98* 96* 98* 97* 96*  CO2 26 31 24 28 26   GLUCOSE 115* 96 92 97 94  BUN 8 6 8 11 11   CREATININE 0.57 0.50 0.49 0.57  0.59  CALCIUM 8.7* 8.7* 8.6* 8.9 8.8*  MG  --  2.1  --   --   --    Liver Function Tests:  Recent Labs Lab 06/12/17 1000  AST 66*  ALT 85*  ALKPHOS 66  BILITOT 0.6  PROT 6.1*  ALBUMIN 3.4*   No results for input(s): LIPASE, AMYLASE in the last 168 hours. No results for input(s): AMMONIA in the last 168 hours. CBC:  Recent Labs Lab 06/08/17 2252 06/12/17 0820 06/13/17 0445  WBC 9.6 8.1 7.0  NEUTROABS  --  5.6  --   HGB 10.6* 11.9* 11.9*  HCT 32.7* 36.9 35.6*  MCV 89.8 88.5 89.4  PLT 348 402* 401*   Cardiac Enzymes: No results for input(s): CKTOTAL, CKMB, CKMBINDEX, TROPONINI in the last 168 hours. BNP: BNP (last 3 results)  Recent Labs  06/08/17 2252  BNP 739.2*    ProBNP (last 3 results)  Recent Labs  04/20/17 1133  PROBNP 795*    CBG:  Recent Labs Lab 06/08/17 2248  GLUCAP 122*       Signed:  THOMPSON,DANIEL MD.  [facility] 06/13/2017, 12:27 PM

## 2017-06-15 ENCOUNTER — Telehealth: Payer: Self-pay | Admitting: Cardiology

## 2017-06-15 NOTE — Telephone Encounter (Signed)
Pt was at [facility] for an ablation last week and didn't know if Dr [name] was aware-ok was told that she would be getting a call to schedule an appt for 5-7 days post op but hasn't heard anything and doesn't know who she needs to see

## 2017-06-15 NOTE — Progress Notes (Signed)
06/15/2017 4:17 pm Received call from [facility] for prior auth for [facility]. Completed. They will have send for review. Isidoro Donning RN CCM Case Mgmt phone [phone]

## 2017-06-15 NOTE — Telephone Encounter (Signed)
Appointment made for 06/17/17 with Dr. [name].

## 2017-06-16 NOTE — Progress Notes (Signed)
Cardiology Office Note:    Date:  06/17/2017   ID:  [name], DOB 10-20-1956, MRN [id number]  PCP:  Jennifer Martin., MD  Cardiologist:  Jennifer Herrlich, MD    Referring MD: Jennifer Martin., MD    ASSESSMENT:    1. Acute on chronic combined systolic and diastolic CHF (congestive heart failure) (HCC)   2. Chronic anticoagulation   3. On amiodarone therapy   4. Coronary artery disease of native artery of native heart with stable angina pectoris (HCC)    PLAN:    In order of problems listed above:  1. Improved I asked her to take her diuretic and potassium daily. She is not on a beta blocker because of bradycardia BNP level is elevated EF is reduced recent decompensated heartilure and she will switch from ARB to ARNI follow-up with me in 2 weeks and recheck renal function. 2. Stable continue anticoagulant 3. Stable continue low-dose amiodarone with a goal to come off in the range of 3 months after intervention 4. Stable at this time I don't think she requires a repeat ischemia evaluation   Next appointment: 2-3 weeks   Medication Adjustments/Labs and Tests Ordered: Current medicines are reviewed at length with the patient today.  Concerns regarding medicines are outlined above.  No orders of the defined types were placed in this encounter.  Meds ordered this encounter  Medications  . DISCONTD: sacubitril-valsartan (ENTRESTO) 24-26 mg per tablet    Order Specific Question:   ACE-inhibitors have NOT been administered in the past 36-hours.    Answer:   YES (confirmed by ordering provider)  . DISCONTD: furosemide (LASIX) 20 MG tablet    Sig: Take 1 tablet (20 mg total) by mouth daily. FOR WEIGHT 131 OR GREATER    Dispense:  30 tablet    Refill:  0  . potassium chloride (K-DUR,KLOR-CON) 20 MEQ tablet    Sig: Take 0.5 tablets (10 mEq total) by mouth 2 (two) times daily.    Dispense:  30 tablet    Refill:  6  . sacubitril-valsartan (ENTRESTO) 24-26 MG    Sig: Take 1 tablet by  mouth 2 (two) times daily.    Dispense:  60 tablet    Refill:  11  . furosemide (LASIX) 20 MG tablet    Sig: Take 1 tablet (20 mg total) by mouth daily. FOR WEIGHT 131 OR GREATER    Dispense:  30 tablet    Refill:  0    Chief Complaint  Patient presents with  . Hospitalization Follow-up    per [facility] to evaluate CHF    History of Present Illness:    Jennifer Martin is a 61 y.o. female with a hx of CAD, Dyslipidemia, HTN, heart failure,and atrial flibrillation on amiodarone with ablation 05/29/17 with electrical isolation and anatomical encircling of all four pulmonary veins with radiofrequency current; cavo-tricuspid isthmus ablation was performed with complete bidirectional isthmus block achievedand COPD last seen in July 2018 with recent heart failure admission.her admission weight was 132 lbs, dropped to 125 and > 9 l net diuresis occurred.  She is improved as having no shortness of breath or edema weight is stable at baseline 125 but unfortunately has not taken a diuretic. She said that after her EP ablation weight up to 141 pounds. She's had no palpitation chest pain syncope TIA or bleeding complication and is pending EP follow-up. She obviously is in sinus rhythm today as she was during the recent hospitalization Compliance with  diet, lifestyle and medications: yes recent [facility] admission: Admit date: 06/08/2017 Discharge date: 06/13/2017  Discharge Diagnoses:  Principal Problem:   Acute on chronic diastolic (congestive) heart failure (HCC) Active Problems:   Hypertensive heart disease with heart failure (HCC)   AF (atrial fibrillation) (HCC)   Acute CHF (congestive heart failure) (HCC)   Acute pulmonary edema (HCC)   Acute combined systolic and diastolic heart failure (HCC)   Mobitz type 1 second degree AV block   SOB (shortness of breath) on exertion  Assessment & Plan    1. Acute on chronic combined CHF with mild-moderate AI and mild MR - varying EF in the past - 45-50%  in 09/2016, 37% by echo 08/2016,  61% by nuc 01/2017 then 30-35% this admission (40% by review from Dr. Mayford Knife). Etiology not totally clear. Question due to recent increase in AF/AFL versus possibility of progressive CAD. She denies any recent angina. She improved significantly with diuresis and is back to her baseline weight. She appears euvolemic on exam. She put out 2.5L yesterday and is net neg 9.4L.  - coreg stopped due to AV block.  - continue low dose ARB with plan to change to Entresto at followup - continue aldactone 12.5mg  daily - creatinine 0.59 and K+4 - will start lasix 20mg  daily and check BMET in 1 week - I instructed her to weight herself daily and if weight increased by 3lbs in 1 day to take an extra lasix and if weight does not improve then call our office 2. Paroxysmal atrial fib/flutter s/p recent ablation, on amiodarone  - continue on Xarelto 20mg  daily  -  BB stopped 2/2 bradycardia.  -  Continue Amio 200mg  daily -  TSH is normal -  LFTs mildly elevated - will repeat as outpt in 1 week since starting statin as well -  Once cardiac status stabilized will need outpatient PFTs and serial monitoring given chronic amio use. 3. LBBB and type 1 second degree AVB/bradycardia  - no further type 2 AV block off carvedilol. -  TSH is normal. 4. CAD -CTO of Cx as above. Patient reports being on Plavix ever since her first heart cath which she reports was many many years ago. She denies any prior h/o stenting. I am not convinced has a current indication for both Plavix in addition to Xarelto. She also reports very easy bruising including thigh hematomas after ablation. Plavix stopped to reduce bleeding risk.  - continue statin  - no BB due to bradycardia - no ASA due to NOAC 5. Hyperlipidemia -  She did previously tolerate simvastatin but not on now and cannot use with Amio.Will try a trial of Crestor 10mg  daily instead. She will need FLP and ALT in 6 weeks.   She is stable from a  cardiac standpoint for discharge.  We will set up TOC visit in 7-10 days.   Past Medical History:  Diagnosis Date  . Anxiety   . Atherosclerosis of coronary artery of native heart with angina pectoris (HCC)   . Atrial flutter (HCC)   . CAD (coronary artery disease)   . COPD (chronic obstructive pulmonary disease) (HCC)   . Demand ischemia (HCC)   . Depression   . Diverticulosis   . Hyperlipidemia   . Hypertension   . Inappropriate ADH syndrome (HCC)   . Migraine    "none since ~ 2012; mild ones then when I did have them because of the beta blockers I was on" (06/09/2017)  . Myocardial infarction (  HCC)    "I've had light ones" (06/09/2017)  . Osteoarthritis   . Pneumonia    "couple times" (06/09/2017)    Past Surgical History:  Procedure Laterality Date  . ATRIAL FIBRILLATION ABLATION N/A 05/29/2017   Procedure: Atrial Fibrillation Ablation;  Surgeon: Regan Lemming, MD;  Location: [facility] INVASIVE CV LAB;  Service: Cardiovascular;  Laterality: N/A;  . BREAST SURGERY Left    "took a gland out; milk duct"  . CARDIAC CATHETERIZATION  2008   has had 2 procedures, the last one approx 2008, never had PCI/stent. Dr [name]  . CARPAL TUNNEL RELEASE Left   . DILATION AND CURETTAGE OF UTERUS     "related to heavy bleeding"  . INGUINAL HERNIA REPAIR Left   . SHOULDER ARTHROSCOPY WITH ROTATOR CUFF REPAIR Right   . TRACHEOSTOMY  2006   "closed on it's own"  . TUBAL LIGATION    . VAGINAL HYSTERECTOMY     "fibroids"    Current Medications: Current Meds  Medication Sig  . acetaminophen (TYLENOL) 500 MG tablet Take 1,000 mg by mouth 2 (two) times daily as needed for mild pain or headache.  . albuterol (PROVENTIL HFA;VENTOLIN HFA) 108 (90 Base) MCG/ACT inhaler Inhale 2 puffs into the lungs every 6 (six) hours as needed for wheezing or shortness of breath.  Marland Kitchen amiodarone (PACERONE) 200 MG tablet Take 1 tablet (200 mg total) by mouth daily.  Marland Kitchen escitalopram (LEXAPRO) 10 MG tablet Take 5 mg  by mouth daily.  . fluticasone (FLONASE) 50 MCG/ACT nasal spray Place 2 sprays into both nostrils daily as needed for allergies.   . furosemide (LASIX) 20 MG tablet Take 1 tablet (20 mg total) by mouth daily. FOR WEIGHT 131 OR GREATER  . LORazepam (ATIVAN) 1 MG tablet Take 1 mg by mouth every 8 (eight) hours as needed for anxiety.   . mometasone-formoterol (DULERA) 200-5 MCG/ACT AERO Inhale 2 puffs into the lungs 2 (two) times daily.  . nitroGLYCERIN (NITROSTAT) 0.4 MG SL tablet Place 0.4 mg under the tongue every 5 (five) minutes as needed for chest pain.  . pantoprazole (PROTONIX) 40 MG tablet Take 40 mg by mouth daily.  . rivaroxaban (XARELTO) 20 MG TABS tablet Take 1 tablet (20 mg total) by mouth daily.  . rosuvastatin (CRESTOR) 10 MG tablet Take 1 tablet (10 mg total) by mouth daily at 6 PM.  . [DISCONTINUED] furosemide (LASIX) 20 MG tablet Take 1 tablet (20 mg total) by mouth daily. FOR WEIGHT 131 OR GREATER  . [DISCONTINUED] furosemide (LASIX) 20 MG tablet Take 1 tablet (20 mg total) by mouth daily. FOR WEIGHT 131 OR GREATER  . [DISCONTINUED] losartan (COZAAR) 25 MG tablet Take 1 tablet (25 mg total) by mouth daily.     Allergies:   Atorvastatin; Cefdinir; Levaquin [levofloxacin in d5w]; and Morphine and related   Social History   Social History  . Marital status: Married    Spouse name: N/A  . Number of children: N/A  . Years of education: N/A   Social History Main Topics  . Smoking status: Current Every Day Smoker    Packs/day: 0.60    Years: 34.00    Types: Cigarettes  . Smokeless tobacco: Never Used  . Alcohol use No  . Drug use: No  . Sexual activity: Not Currently   Other Topics Concern  . None   Social History Narrative  . None     Family History: The patient's family history includes CAD in her brother; Heart disease  in her brother. ROS:   Please see the history of present illness.    All other systems reviewed and are negative.  EKGs/Labs/Other Studies  Reviewed:    The following studies were reviewed today: CXR Jun 16, 2017 ;decompensted heart failure CTA chest 06/09/17: IMPRESSION: 1. No pulmonary embolus. 2. Cardiomegaly with pulmonary edema. 3. Emphysema with central bronchial thickening. 4. Aortic Atherosclerosis (ICD10-I70.0). Coronary artery calcifications. 5. Unchanged smoothly marginated 9 mm fissure based nodule in the superior segment of the right lower lobe, possible intrapulmonary lymph node. Follow-up recommendations as before. Echo: 2D echo 06/10/17 ------------------------------------------------------------------- Study Conclusions - Left ventricle: The cavity size was moderately dilated. Wall thickness was increased in a pattern of mild LVH. Systolic function was moderately reduced. The estimated ejection fraction was in the range of 35% to 40%. Diffuse hypokinesis. - Aortic valve: There was mild to moderate regurgitation. - Mitral valve: There was mild regurgitation. Valve area by pressure half-time: 2.02 cm^2. - Left atrium: The atrium was moderately dilated. - Atrial septum: There was a patent foramen ovale. - Pulmonary arteries: PA peak pressure: 31 mm Hg (S Recent Labs: 04/20/2017: NT-Pro BNP 795 Jun 16, 2017: B Natriuretic Peptide 739.2 06/10/2017: Magnesium 2.1 06/12/2017: ALT 85; TSH 2.906 06/13/2017: BUN 11; Creatinine, Ser 0.59; Hemoglobin 11.9; Platelets 401; Potassium 4.0; Sodium 132  Recent Lipid Panel    Component Value Date/Time   CHOL 179 06/11/2017 1439   CHOL 138 04/20/2017 1133   TRIG 100 06/11/2017 1439   HDL 56 06/11/2017 1439   HDL 77 04/20/2017 1133   CHOLHDL 3.2 06/11/2017 1439   VLDL 20 06/11/2017 1439   LDLCALC 103 (H) 06/11/2017 1439   LDLCALC 44 04/20/2017 1133    Physical Exam:    VS:  BP 136/74 (BP Location: Right Arm, Patient Position: Sitting)   Pulse 66   Ht 5' (1.524 m)   Wt 126 lb 12.8 oz (57.5 kg)   SpO2 98%   BMI 24.76 kg/m     Wt Readings from Last 3  Encounters:  06/17/17 126 lb 12.8 oz (57.5 kg)  06/13/17 125 lb 12.8 oz (57.1 kg)  05/30/17 132 lb 11.2 oz (60.2 kg)     GEN:  Well nourished, well developed in no acute distress HEENT: Normal NECK: No JVD; No carotid bruits LYMPHATICS: No lymphadenopathy CARDIAC: RRR, no murmurs, rubs, gallops RESPIRATORY:  Clear to auscultation without rales, wheezing or rhonchi  ABDOMEN: Soft, non-tender, non-distended MUSCULOSKELETAL:  No edema; No deformity  SKIN: Warm and dry NEUROLOGIC:  Alert and oriented x 3 PSYCHIATRIC:  Normal affect    Signed, Jennifer Herrlich, MD  06/17/2017 3:58 PM    [facility]

## 2017-06-17 ENCOUNTER — Ambulatory Visit (INDEPENDENT_AMBULATORY_CARE_PROVIDER_SITE_OTHER): Payer: PPO | Admitting: Cardiology

## 2017-06-17 ENCOUNTER — Encounter: Payer: Self-pay | Admitting: Cardiology

## 2017-06-17 VITALS — BP 136/74 | HR 66 | Ht 60.0 in | Wt 126.8 lb

## 2017-06-17 DIAGNOSIS — I340 Nonrheumatic mitral (valve) insufficiency: Secondary | ICD-10-CM | POA: Insufficient documentation

## 2017-06-17 DIAGNOSIS — Z79899 Other long term (current) drug therapy: Secondary | ICD-10-CM

## 2017-06-17 DIAGNOSIS — Z7901 Long term (current) use of anticoagulants: Secondary | ICD-10-CM

## 2017-06-17 DIAGNOSIS — I25118 Atherosclerotic heart disease of native coronary artery with other forms of angina pectoris: Secondary | ICD-10-CM | POA: Diagnosis not present

## 2017-06-17 DIAGNOSIS — I351 Nonrheumatic aortic (valve) insufficiency: Secondary | ICD-10-CM | POA: Insufficient documentation

## 2017-06-17 DIAGNOSIS — I5043 Acute on chronic combined systolic (congestive) and diastolic (congestive) heart failure: Secondary | ICD-10-CM | POA: Diagnosis not present

## 2017-06-17 HISTORY — DX: Nonrheumatic mitral (valve) insufficiency: I34.0

## 2017-06-17 HISTORY — DX: Nonrheumatic aortic (valve) insufficiency: I35.1

## 2017-06-17 MED ORDER — FUROSEMIDE 20 MG PO TABS: 20.0000 mg | ORAL_TABLET | Freq: Every day | ORAL | 0 refills | Status: DC

## 2017-06-17 MED ORDER — POTASSIUM CHLORIDE CRYS ER 20 MEQ PO TBCR: 10.0000 meq | EXTENDED_RELEASE_TABLET | Freq: Two times a day (BID) | ORAL | 6 refills | Status: DC

## 2017-06-17 MED ORDER — SACUBITRIL-VALSARTAN 24-26 MG PO TABS: 1.0000 | ORAL_TABLET | Freq: Two times a day (BID) | ORAL | 11 refills | Status: DC

## 2017-06-17 MED ORDER — SACUBITRIL-VALSARTAN 24-26 MG PO TABS: 1.0000 | ORAL_TABLET | Freq: Two times a day (BID) | ORAL | Status: DC

## 2017-06-17 NOTE — Patient Instructions (Signed)
Medication Instructions:  Your physician has recommended you make the following change in your medication:  START sacubitril-valsartan (Entresto) 24-26 mg (1 tablet) twice daily RESTART furosemide RESTART potassium   Labwork: None  Testing/Procedures: None  Follow-Up: Your physician recommends that you schedule a follow-up appointment in: 2 weeks   Any Other Special Instructions Will Be Listed Below (If Applicable).     If you need a refill on your cardiac medications before your next appointment, please call your pharmacy.

## 2017-06-18 ENCOUNTER — Other Ambulatory Visit: Payer: Self-pay

## 2017-06-18 DIAGNOSIS — R55 Syncope and collapse: Secondary | ICD-10-CM

## 2017-06-18 MED ORDER — ROSUVASTATIN CALCIUM 10 MG PO TABS: 10.0000 mg | ORAL_TABLET | Freq: Every day | ORAL | 3 refills | Status: DC

## 2017-06-19 DIAGNOSIS — F419 Anxiety disorder, unspecified: Secondary | ICD-10-CM | POA: Diagnosis not present

## 2017-06-19 DIAGNOSIS — I25118 Atherosclerotic heart disease of native coronary artery with other forms of angina pectoris: Secondary | ICD-10-CM | POA: Diagnosis not present

## 2017-06-19 DIAGNOSIS — Z23 Encounter for immunization: Secondary | ICD-10-CM | POA: Diagnosis not present

## 2017-06-19 DIAGNOSIS — R748 Abnormal levels of other serum enzymes: Secondary | ICD-10-CM | POA: Diagnosis not present

## 2017-06-19 DIAGNOSIS — I482 Chronic atrial fibrillation: Secondary | ICD-10-CM | POA: Diagnosis not present

## 2017-06-19 DIAGNOSIS — I5022 Chronic systolic (congestive) heart failure: Secondary | ICD-10-CM | POA: Diagnosis not present

## 2017-06-19 DIAGNOSIS — I110 Hypertensive heart disease with heart failure: Secondary | ICD-10-CM | POA: Diagnosis not present

## 2017-06-30 ENCOUNTER — Encounter (HOSPITAL_COMMUNITY): Payer: Self-pay | Admitting: Nurse Practitioner

## 2017-06-30 ENCOUNTER — Ambulatory Visit (HOSPITAL_COMMUNITY)
Admission: RE | Admit: 2017-06-30 | Discharge: 2017-06-30 | Disposition: A | Discharge status: Home / Self Care | Payer: PPO | Source: Ambulatory Visit | Attending: Nurse Practitioner | Admitting: Nurse Practitioner

## 2017-06-30 VITALS — BP 102/64 | HR 71 | Ht 60.0 in | Wt 128.0 lb

## 2017-06-30 DIAGNOSIS — I509 Heart failure, unspecified: Secondary | ICD-10-CM | POA: Diagnosis not present

## 2017-06-30 DIAGNOSIS — Z7901 Long term (current) use of anticoagulants: Secondary | ICD-10-CM | POA: Diagnosis not present

## 2017-06-30 DIAGNOSIS — M1990 Unspecified osteoarthritis, unspecified site: Secondary | ICD-10-CM | POA: Diagnosis not present

## 2017-06-30 DIAGNOSIS — I252 Old myocardial infarction: Secondary | ICD-10-CM | POA: Insufficient documentation

## 2017-06-30 DIAGNOSIS — F17210 Nicotine dependence, cigarettes, uncomplicated: Secondary | ICD-10-CM | POA: Insufficient documentation

## 2017-06-30 DIAGNOSIS — Z9889 Other specified postprocedural states: Secondary | ICD-10-CM | POA: Diagnosis not present

## 2017-06-30 DIAGNOSIS — I4892 Unspecified atrial flutter: Secondary | ICD-10-CM | POA: Insufficient documentation

## 2017-06-30 DIAGNOSIS — J449 Chronic obstructive pulmonary disease, unspecified: Secondary | ICD-10-CM | POA: Diagnosis not present

## 2017-06-30 DIAGNOSIS — I10 Essential (primary) hypertension: Secondary | ICD-10-CM | POA: Insufficient documentation

## 2017-06-30 DIAGNOSIS — Z8701 Personal history of pneumonia (recurrent): Secondary | ICD-10-CM | POA: Insufficient documentation

## 2017-06-30 DIAGNOSIS — I447 Left bundle-branch block, unspecified: Secondary | ICD-10-CM | POA: Diagnosis not present

## 2017-06-30 DIAGNOSIS — I4891 Unspecified atrial fibrillation: Secondary | ICD-10-CM | POA: Diagnosis not present

## 2017-06-30 DIAGNOSIS — F419 Anxiety disorder, unspecified: Secondary | ICD-10-CM | POA: Insufficient documentation

## 2017-06-30 DIAGNOSIS — Z930 Tracheostomy status: Secondary | ICD-10-CM | POA: Diagnosis not present

## 2017-06-30 DIAGNOSIS — I2510 Atherosclerotic heart disease of native coronary artery without angina pectoris: Secondary | ICD-10-CM | POA: Diagnosis not present

## 2017-06-30 DIAGNOSIS — F329 Major depressive disorder, single episode, unspecified: Secondary | ICD-10-CM | POA: Insufficient documentation

## 2017-06-30 DIAGNOSIS — E785 Hyperlipidemia, unspecified: Secondary | ICD-10-CM | POA: Diagnosis not present

## 2017-06-30 DIAGNOSIS — I480 Paroxysmal atrial fibrillation: Secondary | ICD-10-CM
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
## 2017-06-30 NOTE — Progress Notes (Signed)
Primary Care Physician: Jennifer Martin., MD Referring Physician: [facility] f/u Cardiologist: Dr. [name] EP: Dr. Holland Falling is a 61 y.o. female with a h/o afib ablation 8/10 that was admitted with acute on chronic diastolic heart failure 8/20-8/25. She was diuresed with good results. She had cough with pink sputum and CXR showed pulmonary edema. CT showed no PE. Since d/c, she has been doing much better. No dyspnea to report, no LLE. EKG shows SR with LBBB. She continues on amiodarone 200 mg daily. No swallowing  or groin issues 2/2 ablation.  Today, she denies symptoms of palpitations, chest pain, shortness of breath, orthopnea, PND, lower extremity edema, dizziness, presyncope, syncope, or neurologic sequela. The patient is tolerating medications without difficulties and is otherwise without complaint today.   Past Medical History:  Diagnosis Date  . Anxiety   . Atherosclerosis of coronary artery of native heart with angina pectoris (HCC)   . Atrial flutter (HCC)   . CAD (coronary artery disease)   . COPD (chronic obstructive pulmonary disease) (HCC)   . Demand ischemia (HCC)   . Depression   . Diverticulosis   . Hyperlipidemia   . Hypertension   . Inappropriate ADH syndrome (HCC)   . Migraine    "none since ~ 2012; mild ones then when I did have them because of the beta blockers I was on" (06/09/2017)  . Myocardial infarction (HCC)    "I've had light ones" (06/09/2017)  . Osteoarthritis   . Pneumonia    "couple times" (06/09/2017)   Past Surgical History:  Procedure Laterality Date  . ATRIAL FIBRILLATION ABLATION N/A 05/29/2017   Procedure: Atrial Fibrillation Ablation;  Surgeon: Regan Lemming, MD;  Location: [facility] INVASIVE CV LAB;  Service: Cardiovascular;  Laterality: N/A;  . BREAST SURGERY Left    "took a gland out; milk duct"  . CARDIAC CATHETERIZATION  2008   has had 2 procedures, the last one approx 2008, never had PCI/stent. Dr [name]  . CARPAL TUNNEL  RELEASE Left   . DILATION AND CURETTAGE OF UTERUS     "related to heavy bleeding"  . INGUINAL HERNIA REPAIR Left   . SHOULDER ARTHROSCOPY WITH ROTATOR CUFF REPAIR Right   . TRACHEOSTOMY  2006   "closed on it's own"  . TUBAL LIGATION    . VAGINAL HYSTERECTOMY     "fibroids"    Current Outpatient Prescriptions  Medication Sig Dispense Refill  . acetaminophen (TYLENOL) 500 MG tablet Take 1,000 mg by mouth 2 (two) times daily as needed for mild pain or headache.    . albuterol (PROVENTIL HFA;VENTOLIN HFA) 108 (90 Base) MCG/ACT inhaler Inhale 2 puffs into the lungs every 6 (six) hours as needed for wheezing or shortness of breath.    Marland Kitchen amiodarone (PACERONE) 200 MG tablet Take 1 tablet (200 mg total) by mouth daily. 90 tablet 3  . escitalopram (LEXAPRO) 10 MG tablet Take 5 mg by mouth daily.    . fluticasone (FLONASE) 50 MCG/ACT nasal spray Place 2 sprays into both nostrils daily as needed for allergies.     . furosemide (LASIX) 20 MG tablet Take 1 tablet (20 mg total) by mouth daily. FOR WEIGHT 131 OR GREATER 30 tablet 0  . LORazepam (ATIVAN) 1 MG tablet Take 1 mg by mouth every 8 (eight) hours as needed for anxiety.     . mometasone-formoterol (DULERA) 200-5 MCG/ACT AERO Inhale 2 puffs into the lungs 2 (two) times daily. 13 g 0  .  nitroGLYCERIN (NITROSTAT) 0.4 MG SL tablet Place 0.4 mg under the tongue every 5 (five) minutes as needed for chest pain.    . pantoprazole (PROTONIX) 40 MG tablet Take 40 mg by mouth daily.    . potassium chloride (K-DUR,KLOR-CON) 20 MEQ tablet Take 0.5 tablets (10 mEq total) by mouth 2 (two) times daily. 30 tablet 6  . rivaroxaban (XARELTO) 20 MG TABS tablet Take 1 tablet (20 mg total) by mouth daily. 90 tablet 3  . rosuvastatin (CRESTOR) 10 MG tablet Take 1 tablet (10 mg total) by mouth daily at 6 PM. 90 tablet 3  . sacubitril-valsartan (ENTRESTO) 24-26 MG Take 1 tablet by mouth 2 (two) times daily. 60 tablet 11   No current facility-administered medications for  this encounter.     Allergies  Allergen Reactions  . Atorvastatin     Myalgias even at  daily  . Cefdinir Diarrhea  . Levaquin [Levofloxacin In D5w] Diarrhea  . Morphine And Related Nausea Only    Social History   Social History  . Marital status: Married    Spouse name: N/A  . Number of children: N/A  . Years of education: N/A   Occupational History  . Not on file.   Social History Main Topics  . Smoking status: Current Every Day Smoker    Packs/day: 0.60    Years: 34.00    Types: Cigarettes  . Smokeless tobacco: Never Used  . Alcohol use No  . Drug use: No  . Sexual activity: Not Currently   Other Topics Concern  . Not on file   Social History Narrative  . No narrative on file    Family History  Problem Relation Age of Onset  . CAD Brother   . Heart disease Brother     ROS- All systems are reviewed and negative except as per the HPI above  Physical Exam: Vitals:   06/30/17 1135  BP: 102/64  Pulse: 71  Weight: 128 lb (58.1 kg)  Height: 5' (1.524 m)   Wt Readings from Last 3 Encounters:  06/30/17 128 lb (58.1 kg)  06/17/17 126 lb 12.8 oz (57.5 kg)  06/13/17 125 lb 12.8 oz (57.1 kg)    Labs: Lab Results  Component Value Date   NA 132 (L) 06/13/2017   K 4.0 06/13/2017   CL 96 (L) 06/13/2017   CO2 26 06/13/2017   GLUCOSE 94 06/13/2017   BUN 11 06/13/2017   CREATININE 0.59 06/13/2017   CALCIUM 8.8 (L) 06/13/2017   MG 2.1 06/10/2017   No results found for: INR Lab Results  Component Value Date   CHOL 179 06/11/2017   HDL 56 06/11/2017   LDLCALC 103 (H) 06/11/2017   TRIG 100 06/11/2017     GEN- The patient is well appearing, alert and oriented x 3 today.   Head- normocephalic, atraumatic Eyes-  Sclera clear, conjunctiva pink Ears- hearing intact Oropharynx- clear Neck- supple, no JVP Lymph- no cervical lymphadenopathy Lungs- Clear to ausculation bilaterally, normal work of breathing Heart- Regular rate and rhythm, no murmurs,  rubs or gallops, PMI not laterally displaced GI- soft, NT, ND, + BS Extremities- no clubbing, cyanosis, or edema MS- no significant deformity or atrophy Skin- no rash or lesion Psych- euthymic mood, full affect Neuro- strength and sensation are intact  EKG-SR with 1st degree AV block. LAD, LBBB,pr int 224 ms, qrs int 152 ms, qtc 519 ms Epic records reviewed    Assessment and Plan: 1. Afib S/p ablation 8/10 Staying in  SR on amiodarone 200 mg a day Continue xarelto 20 mg a day for chadsvasc score of at least 4  2. HF Weight stable Lasix as prescribed  Avoid salt  F/u with Dr. [name] 9/12, Dr, Elberta Fortis 11/21 Afib clinic   Jennifer Martin C. [facility] [address] [place], [place] [address] [phone]

## 2017-07-01 ENCOUNTER — Ambulatory Visit (INDEPENDENT_AMBULATORY_CARE_PROVIDER_SITE_OTHER): Payer: PPO | Admitting: Cardiology

## 2017-07-01 VITALS — BP 130/68 | HR 66 | Ht 60.0 in | Wt 126.8 lb

## 2017-07-01 DIAGNOSIS — I4892 Unspecified atrial flutter: Secondary | ICD-10-CM

## 2017-07-01 DIAGNOSIS — I480 Paroxysmal atrial fibrillation: Secondary | ICD-10-CM | POA: Diagnosis not present

## 2017-07-01 DIAGNOSIS — Z7901 Long term (current) use of anticoagulants: Secondary | ICD-10-CM | POA: Diagnosis not present

## 2017-07-01 DIAGNOSIS — Z79899 Other long term (current) drug therapy: Secondary | ICD-10-CM

## 2017-07-01 DIAGNOSIS — I5043 Acute on chronic combined systolic (congestive) and diastolic (congestive) heart failure: Secondary | ICD-10-CM | POA: Diagnosis not present

## 2017-07-01 DIAGNOSIS — I5042 Chronic combined systolic (congestive) and diastolic (congestive) heart failure: Secondary | ICD-10-CM

## 2017-07-01 MED ORDER — FUROSEMIDE 20 MG PO TABS: ORAL_TABLET | ORAL | 11 refills | Status: DC

## 2017-07-01 MED ORDER — FUROSEMIDE 20 MG PO TABS: ORAL_TABLET | ORAL | 0 refills | Status: DC

## 2017-07-01 MED ORDER — SACUBITRIL-VALSARTAN 49-51 MG PO TABS: 1.0000 | ORAL_TABLET | Freq: Two times a day (BID) | ORAL | 2 refills | Status: DC
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
## 2017-07-02 LAB — BASIC METABOLIC PANEL
BUN/Creatinine Ratio: 15 (ref 12–28)
BUN: 9 mg/dL (ref 8–27)
CO2: 23 mmol/L (ref 20–29)
Calcium: 9.6 mg/dL (ref 8.7–10.3)
Chloride: 94 mmol/L — ABNORMAL LOW (ref 96–106)
Creatinine, Ser: 0.61 mg/dL (ref 0.57–1.00)
GFR calc Af Amer: 112 mL/min/{1.73_m2} (ref >59)
GFR calc non Af Amer: 97 mL/min/{1.73_m2} (ref >59)
Glucose: 99 mg/dL (ref 65–99)
Potassium: 4.2 mmol/L (ref 3.5–5.2)
Sodium: 138 mmol/L (ref 134–144)

## 2017-07-02 LAB — BRAIN NATRIURETIC PEPTIDE: BNP: 122.5 pg/mL — ABNORMAL HIGH (ref 0.0–100.0)

## 2017-07-06 ENCOUNTER — Other Ambulatory Visit: Payer: Self-pay | Admitting: *Deleted

## 2017-07-06 NOTE — Patient Outreach (Signed)
[facility]) Care Management  07/06/2017  Jennifer Martin 1956/01/29 [id number]  Referral via EMMI-Heart Failure -Red Alert -Day # 20, 07/05/2017;  Reason: Lost interest in things they use to enjoy-Yes.  Call #1 to patient who was advised of reason for call. HIPPA verification received from patient.   Patient voices that she received automated call yesterday &  answered no to the question-lost interest in things. States she is doing well & has family support. States if she feels signs of depression she feels very comfortable taking with her team of doctors.   States she has attended appointment for hospital follow up with primary care provider and cardiologist. States she has also attended atrial fib clinic. Voices she has no problem with transportation.   States she has all of needed medications & is taking as prescribed by her MD.  Roger Kill that she is on blood pressure & heart medications & sometimes does gets lightheadedness. She knows how to use equipment to check pulse & blood pressure. States she has MD phone numbers to call as needed. Voices she is weighing daily & knows to report abnormal weigh gains to MD office. Voices that she has educational literature on Heart Failure & does not need anymore at this time.  Patient was advised of falls prevention and she voices understanding.   Patient voiced no further health concerns at this time. EMMI call completed.  Plan: Send falls prevention literature to patient as means of review. Send to care management assistant to close out.   [name], RN BSN CCM Care Management Coordinator [facility] Care Management  [phone] .

## 2017-07-07 ENCOUNTER — Encounter (HOSPITAL_COMMUNITY): Payer: Self-pay

## 2017-07-08 ENCOUNTER — Telehealth: Payer: Self-pay

## 2017-07-08 NOTE — Telephone Encounter (Signed)
Patient called stating that her heart rate increased to 144-150, she had some chest heaviness, did not take any nitroglycerin, lasted for about 15 minutes, has now subsided. BP 113/66 and HR 73. Patient states that she feels completely fine now. She was told that she may have some break through for about 3 months s/p ablation. Reviewed with Dr. [name] and reinforced break through for 90 days s/p ablation. Offered for patient to come in for an EKG. Patient states that since she is fine now, she will wait and if it happens again she will call and come in for an EKG. No further questions.

## 2017-07-21 ENCOUNTER — Encounter: Payer: Self-pay | Admitting: *Deleted

## 2017-07-24 ENCOUNTER — Other Ambulatory Visit: Payer: Self-pay | Admitting: *Deleted

## 2017-07-24 NOTE — Patient Outreach (Signed)
[facility]) Care Management  07/24/2017  Jennifer Martin Jun 07, 1956 [id number]  Referral via EMMI-Heart Failure-Red Alert Day#37, 10/3a; Reason: Questions about meds-yes.  Call #1 to patient; left HIPPA compliant voicemail requesting call back.  Plan: Will follow up.  [name], RN BSN CCM Care Management Coordinator [facility] Care Management  [phone]

## 2017-07-27 ENCOUNTER — Other Ambulatory Visit: Payer: Self-pay | Admitting: *Deleted

## 2017-07-27 NOTE — Patient Outreach (Signed)
[facility]) Care Management  07/27/2017  Jennifer Martin Feb 08, 1956 [id number]   EMMI-HF Red Alert referral; Day#  37; 07/22/2017 Reason: questions about meds=Yes  Call #2 to patient who was advised of reason for call. States the automatic call was "crazy". States she was answering "no" but the automatic call response kept saying "yes". States she hung up on the call because her answer was never confirmed as "no". Patient voices that she does not have any questions about her mediations. States she has all of her medication & is taking as instructed by her doctors.  Voices that she continues to weigh herself daily & knows to report overage of her weight to MD. States she has transportation to MD office. Voices that she knows when she needs to call 911. Voices she has educational literature on HF on line & does not need further materials at this time.   Patient voices that she does not have any concerns at this time. Patient voices good working knowledge of self care of her chronic condition.   EMMI call completed.  Plan: Send to care management assistant to close case.   [name], RN BSN CCM Care Management Coordinator [facility] Care Management  [phone]

## 2017-07-28 ENCOUNTER — Telehealth: Payer: Self-pay | Admitting: Cardiology

## 2017-07-28 ENCOUNTER — Other Ambulatory Visit: Payer: Self-pay | Admitting: *Deleted

## 2017-07-28 NOTE — Telephone Encounter (Signed)
I am unsure, sounds like neuropathy, see her PCP

## 2017-07-28 NOTE — Telephone Encounter (Signed)
Symptoms started [place]. Hands burn, tingle, really sensitive to heat. Sensitive when washing hands even in warm water, not hot water and when getting clothes out of the dryer, feels like hands are on fire. Also on the inside of left foot and outside of right foot, but worse on hands. No sunburn, no bug bites. Please advise.

## 2017-07-28 NOTE — Telephone Encounter (Signed)
Patient states that ever since she went to the beach and has had a burning sensation on her hand and described almost a burning feeling and is wondering if it might be the side effects of the amiodorone.  Please call her. She has an appt on Thursday with Dr. [name].

## 2017-07-28 NOTE — Telephone Encounter (Signed)
Patient advised could possibly be neuropathy and to call PCP. Patient verbalized understanding.

## 2017-07-28 NOTE — Patient Outreach (Signed)
[facility]) Care Management  07/28/2017  Jennifer Martin 1956/03/30 [id number]  Referral from Nurse Call Center; patient c/o burning/numbness/tingling of both hands & both feet; pt was referred to primary care office:  Telephone call to patient; HIPPA verification received.  Patient voices that she has talked with cardiology office and will be seeing them on 10/11 at already scheduled appointment. States they referred her to primary care provider.  States she called primary care office & was advised to call in AM to get a same day appointment with MD for evaluation.   States she has transportation to appointment & plans to see MD tomorrow when she gets same day appointment.   Voices no further complaints.  Plan: Close case/send to care management assistant.  [name], RN BSN CCM Care Management Coordinator [facility] Care Management  [phone]

## 2017-07-29 NOTE — Progress Notes (Deleted)
Cardiology Office Note:    Date:  07/29/2017   ID:  Jennifer Martin, DOB 1956-04-22, MRN [id number]  PCP:  [name]., MD  Cardiologist:  [name], MD    Referring MD: [name]., MD    ASSESSMENT:    1. Hypertensive heart disease with heart failure (HCC)   2. Chronic combined systolic and diastolic heart failure (HCC)   3. Atrial flutter, unspecified type (HCC)   4. Paroxysmal atrial fibrillation (HCC)   5. On amiodarone therapy   6. Hyperlipidemia, unspecified hyperlipidemia type   7. Chronic anticoagulation    PLAN:    In order of problems listed above:  1. ***   Next appointment: ***   Medication Adjustments/Labs and Tests Ordered: Current medicines are reviewed at length with the patient today.  Concerns regarding medicines are outlined above.  No orders of the defined types were placed in this encounter.  No orders of the defined types were placed in this encounter.   No chief complaint on file.   History of Present Illness:    Jennifer SEITER is a 61 y.o. female with a hx of  CAD, Dyslipidemia, HTN, heart failure,and atrial flibrillation on amiodarone with ablation 05/29/17 with electrical isolation and anatomical encircling of all four pulmonary veins with radiofrequency current; cavo-tricuspid isthmus ablation was performed with complete bidirectional isthmus block achievedand COPD l last seen one month ago.. Compliance with diet, lifestyle and medications: *** Past Medical History:  Diagnosis Date  . Anxiety   . Atherosclerosis of coronary artery of native heart with angina pectoris (HCC)   . Atrial flutter (HCC)   . CAD (coronary artery disease)   . COPD (chronic obstructive pulmonary disease) (HCC)   . Demand ischemia (HCC)   . Depression   . Diverticulosis   . Hyperlipidemia   . Hypertension   . Inappropriate ADH syndrome (HCC)   . Migraine    "none since ~ 2012; mild ones then when I did have them because of the beta blockers I was  on" (06/09/2017)  . Myocardial infarction (HCC)    "I've had light ones" (06/09/2017)  . Osteoarthritis   . Pneumonia    "couple times" (06/09/2017)    Past Surgical History:  Procedure Laterality Date  . ATRIAL FIBRILLATION ABLATION N/A 05/29/2017   Procedure: Atrial Fibrillation Ablation;  Surgeon: Regan Lemming, MD;  Location: [facility] INVASIVE CV LAB;  Service: Cardiovascular;  Laterality: N/A;  . BREAST SURGERY Left    "took a gland out; milk duct"  . CARDIAC CATHETERIZATION  2008   has had 2 procedures, the last one approx 2008, never had PCI/stent. Dr [name]  . CARPAL TUNNEL RELEASE Left   . DILATION AND CURETTAGE OF UTERUS     "related to heavy bleeding"  . INGUINAL HERNIA REPAIR Left   . SHOULDER ARTHROSCOPY WITH ROTATOR CUFF REPAIR Right   . TRACHEOSTOMY  2006   "closed on it's own"  . TUBAL LIGATION    . VAGINAL HYSTERECTOMY     "fibroids"    Current Medications: No outpatient prescriptions have been marked as taking for the 07/30/17 encounter (Appointment) with Baldo Daub, MD.     Allergies:   Atorvastatin; Cefdinir; Levaquin [levofloxacin in d5w]; and Morphine and related   Social History   Social History  . Marital status: Married    Spouse name: N/A  . Number of children: N/A  . Years of education: N/A   Social History Main Topics  .  Smoking status: Current Every Day Smoker    Packs/day: 0.60    Years: 34.00    Types: Cigarettes, E-cigarettes  . Smokeless tobacco: Never Used  . Alcohol use No  . Drug use: No  . Sexual activity: Not Currently   Other Topics Concern  . Not on file   Social History Narrative  . No narrative on file     Family History: The patient's ***family history includes CAD in her brother; Heart disease in her brother. ROS:   Please see the history of present illness.    All other systems reviewed and are negative.  EKGs/Labs/Other Studies Reviewed:    The following studies were reviewed today:  EKG:  EKG ordered  today.  The ekg ordered today demonstrates ***  Recent Labs: 04/20/2017: NT-Pro BNP 795 06/10/2017: Magnesium 2.1 06/12/2017: ALT 85; TSH 2.906 06/13/2017: Hemoglobin 11.9; Platelets 401 07/01/2017: BNP 122.5; BUN 9; Creatinine, Ser 0.61; Potassium 4.2; Sodium 138  Recent Lipid Panel    Component Value Date/Time   CHOL 179 06/11/2017 1439   CHOL 138 04/20/2017 1133   TRIG 100 06/11/2017 1439   HDL 56 06/11/2017 1439   HDL 77 04/20/2017 1133   CHOLHDL 3.2 06/11/2017 1439   VLDL 20 06/11/2017 1439   LDLCALC 103 (H) 06/11/2017 1439   LDLCALC 44 04/20/2017 1133    Physical Exam:    VS:  There were no vitals taken for this visit.    Wt Readings from Last 3 Encounters:  07/01/17 126 lb 12.8 oz (57.5 kg)  06/30/17 128 lb (58.1 kg)  06/17/17 126 lb 12.8 oz (57.5 kg)     GEN: *** Well nourished, well developed in no acute distress HEENT: Normal NECK: No JVD; No carotid bruits LYMPHATICS: No lymphadenopathy CARDIAC: ***RRR, no murmurs, rubs, gallops RESPIRATORY:  Clear to auscultation without rales, wheezing or rhonchi  ABDOMEN: Soft, non-tender, non-distended MUSCULOSKELETAL:  No edema; No deformity  SKIN: Warm and dry NEUROLOGIC:  Alert and oriented x 3 PSYCHIATRIC:  Normal affect    Signed, [name], MD  07/29/2017 1:34 PM    [facility]

## 2017-07-30 ENCOUNTER — Ambulatory Visit: Payer: PPO | Admitting: Cardiology

## 2017-07-30 ENCOUNTER — Encounter: Payer: Self-pay | Admitting: *Deleted

## 2017-07-30 ENCOUNTER — Other Ambulatory Visit: Payer: Self-pay | Admitting: *Deleted

## 2017-07-30 NOTE — Patient Outreach (Signed)
[facility]) Care Management  07/29/17  MECO BAZER 14-Jul-1956 [id number]  EMMI-Heart Failure RED ON EMMI ALERT DAY#: 43 DATE 07/28/17:  RED ALERT: New/worsening problems? Yes New swelling? Yes Questions about meds? Yes  Outreach attempt to patient. HIPAA verification received from patient. Patient confirmed having numbness and tingling in her hand, which has decreased since the day before. She stated, the numbness and tingling is new for her. Patient stated, she called her primary MD's office to inform them about her symptoms. Per patient, the doctor told her it could be related to neuropathy. Patient has a scheduled appointment with her primary MD on 07/30/17. Patient wanted to know if her medications could be causing her numbness and tingling in her hands. RN CM Dorann Lodge) referred patient to ask the MD about her medicines during her office visit scheduled for 07/30/17.   Patient asked for information about Heart Healthy foods. She was concerned about her sodium intake. Patient stated, she likes to eat out on occasions and it is difficult to choose low sodium foods. Patient discussed a variety of food choices during out conversation. She enjoys baked potatoes and sweet potatoes. Patient stated, she over ate a day ago. She had graham crackers, chocolate pudding, and milk. She verbalized gaining 3 to 4 pounds over night. Patient reported, she took an extra dose of Lasix, per MD orders. Patient weighed herself a second time, on the same day. She stated, her weight decreased after taking the Lasix. Patient voiced her weight at 128 pounds for 07/29/17. Patient monitors her fluid intake and takes her medicine as prescribed.  She is knowledgeable about calling the doctor, if her symptoms worsen.     Plan: RM CM will send successful outreach letter to patient. RN CM will send patient EMMI educational materials. RN CM advised patient to contact RNCM for any needs or concerns. RN  CM advised patient to alert MD for any changes in conditions.  RN CM will notify [facility] Case Management Assistant regarding case closure.    Wynelle Cleveland, RN, BSN, MHA/MSL, [facility] Telephonic Care Manager Coordinator [facility] Direct Phone: [phone] Toll Free: [phone] Fax: [phone]

## 2017-08-06 ENCOUNTER — Ambulatory Visit (INDEPENDENT_AMBULATORY_CARE_PROVIDER_SITE_OTHER): Payer: PPO | Admitting: Cardiology

## 2017-08-06 ENCOUNTER — Encounter: Payer: Self-pay | Admitting: Cardiology

## 2017-08-06 VITALS — BP 132/78 | HR 65 | Ht 60.0 in | Wt 128.0 lb

## 2017-08-06 DIAGNOSIS — I5042 Chronic combined systolic (congestive) and diastolic (congestive) heart failure: Secondary | ICD-10-CM | POA: Diagnosis not present

## 2017-08-06 DIAGNOSIS — I5043 Acute on chronic combined systolic (congestive) and diastolic (congestive) heart failure: Secondary | ICD-10-CM | POA: Diagnosis not present

## 2017-08-06 DIAGNOSIS — E785 Hyperlipidemia, unspecified: Secondary | ICD-10-CM | POA: Diagnosis not present

## 2017-08-06 DIAGNOSIS — Z79899 Other long term (current) drug therapy: Secondary | ICD-10-CM

## 2017-08-06 MED ORDER — SACUBITRIL-VALSARTAN 49-51 MG PO TABS: 1.0000 | ORAL_TABLET | Freq: Two times a day (BID) | ORAL | 2 refills | Status: DC

## 2017-08-06 MED ORDER — PRAVASTATIN SODIUM 20 MG PO TABS: 20.0000 mg | ORAL_TABLET | Freq: Every evening | ORAL | 3 refills | Status: DC

## 2017-08-06 MED ORDER — FUROSEMIDE 20 MG PO TABS: 20.0000 mg | ORAL_TABLET | Freq: Two times a day (BID) | ORAL | 11 refills | Status: DC

## 2017-08-06 MED ORDER — SACUBITRIL-VALSARTAN 97-103 MG PO TABS: 1.0000 | ORAL_TABLET | Freq: Two times a day (BID) | ORAL | 6 refills | Status: DC
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
## 2017-08-06 NOTE — Patient Instructions (Signed)
Medication Instructions:  Your physician has recommended you make the following change in your medication:  INCREASE entresto 97-103 mg twice daily  Labwork: Your physician recommends that you return for lab work in: today. BMP, TSH  Testing/Procedures: None  Follow-Up: Your physician recommends that you schedule a follow-up appointment in: 3 months.  Any Other Special Instructions Will Be Listed Below (If Applicable).     If you need a refill on your cardiac medications before your next appointment, please call your pharmacy.

## 2017-08-07 LAB — BASIC METABOLIC PANEL
BUN / CREAT RATIO: 21 (ref 12–28)
BUN: 13 mg/dL (ref 8–27)
CO2: 27 mmol/L (ref 20–29)
CREATININE: 0.61 mg/dL (ref 0.57–1.00)
Calcium: 9.4 mg/dL (ref 8.7–10.3)
Chloride: 94 mmol/L — ABNORMAL LOW (ref 96–106)
GFR, EST AFRICAN AMERICAN: 112 mL/min/{1.73_m2} (ref >59)
GFR, EST NON AFRICAN AMERICAN: 97 mL/min/{1.73_m2} (ref >59)
GLUCOSE: 88 mg/dL (ref 65–99)
Potassium: 4.4 mmol/L (ref 3.5–5.2)
SODIUM: 136 mmol/L (ref 134–144)

## 2017-08-07 LAB — TSH: TSH: 2.26 u[IU]/mL (ref 0.450–4.500)

## 2017-08-12 DIAGNOSIS — R748 Abnormal levels of other serum enzymes: Secondary | ICD-10-CM | POA: Diagnosis not present

## 2017-08-21 DIAGNOSIS — I5022 Chronic systolic (congestive) heart failure: Secondary | ICD-10-CM | POA: Diagnosis not present

## 2017-08-21 DIAGNOSIS — F419 Anxiety disorder, unspecified: Secondary | ICD-10-CM | POA: Diagnosis not present

## 2017-08-21 DIAGNOSIS — J449 Chronic obstructive pulmonary disease, unspecified: Secondary | ICD-10-CM | POA: Diagnosis not present

## 2017-08-21 DIAGNOSIS — R5381 Other malaise: Secondary | ICD-10-CM | POA: Diagnosis not present

## 2017-09-07 ENCOUNTER — Other Ambulatory Visit: Payer: Self-pay

## 2017-09-07 DIAGNOSIS — I4892 Unspecified atrial flutter: Secondary | ICD-10-CM

## 2017-09-07 DIAGNOSIS — I480 Paroxysmal atrial fibrillation: Secondary | ICD-10-CM

## 2017-09-07 DIAGNOSIS — I25118 Atherosclerotic heart disease of native coronary artery with other forms of angina pectoris: Secondary | ICD-10-CM

## 2017-09-07 MED ORDER — RIVAROXABAN 20 MG PO TABS: 20.0000 mg | ORAL_TABLET | Freq: Every day | ORAL | 3 refills | Status: DC

## 2017-09-09 ENCOUNTER — Ambulatory Visit (INDEPENDENT_AMBULATORY_CARE_PROVIDER_SITE_OTHER): Payer: PPO | Admitting: Cardiology

## 2017-09-09 ENCOUNTER — Encounter: Payer: Self-pay | Admitting: Cardiology

## 2017-09-09 VITALS — BP 125/71 | HR 63 | Ht 60.0 in | Wt 127.8 lb

## 2017-09-09 DIAGNOSIS — I10 Essential (primary) hypertension: Secondary | ICD-10-CM | POA: Diagnosis not present

## 2017-09-09 DIAGNOSIS — E785 Hyperlipidemia, unspecified: Secondary | ICD-10-CM

## 2017-09-09 DIAGNOSIS — I4819 Other persistent atrial fibrillation: Secondary | ICD-10-CM

## 2017-09-09 DIAGNOSIS — I2510 Atherosclerotic heart disease of native coronary artery without angina pectoris: Secondary | ICD-10-CM | POA: Diagnosis not present

## 2017-09-09 DIAGNOSIS — I481 Persistent atrial fibrillation: Secondary | ICD-10-CM

## 2017-09-09 NOTE — Progress Notes (Signed)
Electrophysiology Office Note   Date:  09/09/2017   ID:  Jennifer Martin, DOB [birth year], MRN [id number]  PCP:  [name]., MD  Cardiologist:  [name] Primary Electrophysiologist:  Zaid Tomes [name], MD    Chief Complaint  Patient presents with  . Follow-up    PAF/3 months post ablation     History of Present Illness: Jennifer Martin is a 61 y.o. female who is being seen today for the evaluation of atrial flutter at the request of [name]. Presenting today for electrophysiology evaluation. She has a history of coronary artery disease, hyperlipidemia, hypertension, and atrial flutter. She also has COPD with a demand ischemia and heart failure. She was admitted to [facility] in atrial fibrillation in April. She has no angina only mild shortness of breath, felt related to her COPD. She has not had syncope or bleeding complications of her anticoagulants. She was placed on amiodarone during her hospitalization in April.  He had an atrial fibrillation/flutter ablation on 05/29/17.  Today, denies symptoms of palpitations, chest pain, shortness of breath, orthopnea, PND, lower extremity edema, claudication, dizziness, presyncope, syncope, bleeding, or neurologic sequela. The patient is tolerating medications without difficulties.  Since that time, she is felt well.  She has not had palpitations, shortness of breath, or weakness fatigue.  She feels that she has been in sinus rhythm.  Past Medical History:  Diagnosis Date  . Anxiety   . Atherosclerosis of coronary artery of native heart with angina pectoris (HCC)   . Atrial flutter (HCC)   . CAD (coronary artery disease)   . COPD (chronic obstructive pulmonary disease) (HCC)   . Demand ischemia (HCC)   . Depression   . Diverticulosis   . Hyperlipidemia   . Hypertension   . Inappropriate ADH syndrome (HCC)   . Migraine    "none since ~ 2012; mild ones then when I did have them because of the beta blockers I was on"  (06/09/2017)  . Myocardial infarction (HCC)    "I've had light ones" (06/09/2017)  . Osteoarthritis   . Pneumonia    "couple times" (06/09/2017)   Past Surgical History:  Procedure Laterality Date  . ATRIAL FIBRILLATION ABLATION N/A 05/29/2017   Procedure: Atrial Fibrillation Ablation;  Surgeon: Regan Lemming, MD;  Location: [facility] INVASIVE CV LAB;  Service: Cardiovascular;  Laterality: N/A;  . BREAST SURGERY Left    "took a gland out; milk duct"  . CARDIAC CATHETERIZATION  2008   has had 2 procedures, the last one approx 2008, never had PCI/stent. Dr [name]  . CARPAL TUNNEL RELEASE Left   . DILATION AND CURETTAGE OF UTERUS     "related to heavy bleeding"  . INGUINAL HERNIA REPAIR Left   . SHOULDER ARTHROSCOPY WITH ROTATOR CUFF REPAIR Right   . TRACHEOSTOMY  2006   "closed on it's own"  . TUBAL LIGATION    . VAGINAL HYSTERECTOMY     "fibroids"     Current Outpatient Medications  Medication Sig Dispense Refill  . acetaminophen (TYLENOL) 500 MG tablet Take 1,000 mg by mouth 2 (two) times daily as needed for mild pain or headache.    . albuterol (PROVENTIL HFA;VENTOLIN HFA) 108 (90 Base) MCG/ACT inhaler Inhale 2 puffs into the lungs every 6 (six) hours as needed for wheezing or shortness of breath.    Marland Kitchen amiodarone (PACERONE) 200 MG tablet Take 1 tablet (200 mg total) by mouth daily. 90 tablet 3  . escitalopram (LEXAPRO) 10 MG tablet  Take 5 mg by mouth daily.    . fluticasone (FLONASE) 50 MCG/ACT nasal spray Place 2 sprays into both nostrils daily as needed for allergies.     . furosemide (LASIX) 20 MG tablet Take 1 tablet (20 mg total) by mouth 2 (two) times daily. Take only one daily if you weigh 124l bs or less 30 tablet 11  . LORazepam (ATIVAN) 1 MG tablet Take 1 mg by mouth every 8 (eight) hours as needed for anxiety.     . mometasone-formoterol (DULERA) 200-5 MCG/ACT AERO Inhale 2 puffs into the lungs 2 (two) times daily. 13 g 0  . nitroGLYCERIN (NITROSTAT) 0.4 MG SL tablet  Place 0.4 mg under the tongue every 5 (five) minutes as needed for chest pain.    . pantoprazole (PROTONIX) 40 MG tablet Take 40 mg by mouth daily.    . potassium chloride (K-DUR,KLOR-CON) 20 MEQ tablet Take 0.5 tablets (10 mEq total) by mouth 2 (two) times daily. 30 tablet 6  . pravastatin (PRAVACHOL) 20 MG tablet Take 1 tablet (20 mg total) by mouth every evening. 90 tablet 3  . rivaroxaban (XARELTO) 20 MG TABS tablet Take 1 tablet (20 mg total) daily by mouth. 90 tablet 3  . sacubitril-valsartan (ENTRESTO) 97-103 MG Take 1 tablet by mouth 2 (two) times daily. 60 tablet 6   No current facility-administered medications for this visit.     Allergies:   Atorvastatin; Cefdinir; Levaquin [levofloxacin in d5w]; and Morphine and related   Social History:  The patient  reports that she has been smoking cigarettes and e-cigarettes.  She has a 20.40 pack-year smoking history. she has never used smokeless tobacco. She reports that she does not drink alcohol or use drugs.   Family History:  The patient's family history includes CAD in her brother; Heart disease in her brother.    ROS:  Please see the history of present illness.   Otherwise, review of systems is positive for none.   All other systems are reviewed and negative.   PHYSICAL EXAM: VS:  BP 125/71   Pulse 63   Ht 5' (1.524 m)   Wt 127 lb 12.8 oz (58 kg)   BMI 24.96 kg/m  , BMI Body mass index is 24.96 kg/m. GEN: Well nourished, well developed, in no acute distress  HEENT: normal  Neck: no JVD, carotid bruits, or masses Cardiac: RRR; no murmurs, rubs, or gallops,no edema  Respiratory:  clear to auscultation bilaterally, normal work of breathing GI: soft, nontender, nondistended, + BS MS: no deformity or atrophy  Skin: warm and dry Neuro:  Strength and sensation are intact Psych: euthymic mood, full affect  EKG:  EKG is ordered today. Personal review of the ekg ordered shows sinus rhythm, LBBB   Recent Labs: 04/20/2017: NT-Pro  BNP 795 06/10/2017: Magnesium 2.1 06/12/2017: ALT 85 06/13/2017: Hemoglobin 11.9; Platelets 401 07/01/2017: BNP 122.5 08/06/2017: BUN 13; Creatinine, Ser 0.61; Potassium 4.4; Sodium 136; TSH 2.260    Lipid Panel     Component Value Date/Time   CHOL 179 06/11/2017 1439   CHOL 138 04/20/2017 1133   TRIG 100 06/11/2017 1439   HDL 56 06/11/2017 1439   HDL 77 04/20/2017 1133   CHOLHDL 3.2 06/11/2017 1439   VLDL 20 06/11/2017 1439   LDLCALC 103 (H) 06/11/2017 1439   LDLCALC 44 04/20/2017 1133     Wt Readings from Last 3 Encounters:  09/09/17 127 lb 12.8 oz (58 kg)  08/06/17 128 lb (58.1 kg)  07/01/17 126  lb 12.8 oz (57.5 kg)      Other studies Reviewed: Additional studies/ records that were reviewed today include: TTE 06/10/17 - Left ventricle: The cavity size was moderately dilated. Wall   thickness was increased in a pattern of mild LVH. Systolic   function was moderately reduced. The estimated ejection fraction   was in the range of 35% to 40%. Diffuse hypokinesis. - Aortic valve: There was mild to moderate regurgitation. - Mitral valve: There was mild regurgitation. Valve area by   pressure half-time: 2.02 cm^2. - Left atrium: The atrium was moderately dilated. - Atrial septum: There was a patent foramen ovale. - Pulmonary arteries: PA peak pressure: 31 mm Hg (S).  ASSESSMENT AND PLAN:  1.  Atrial fibrillation/flutter: Currently on amiodarone and Xarelto.  Had an atrial fibrillation/flutter ablation on 05/29/17.  Currently on amiodarone and Xarelto.  No recurrences.  Continue current management.  2. Hypertensive heart disease with heart failure: Current signs of volume overload.  On optimal medical therapy.  Has been bradycardic at times and thus we Rania Prothero not start a beta-blocker and currently.  No other changes.  We Varie Machamer plan to stop amiodarone.  3. Hyperlipidemia: Continue statin  4. Coronary artery disease with stable angina: No current chest pain.  Continue Plavix and  statin.  Current medicines are reviewed at length with the patient today.   The patient does not have concerns regarding her medicines.  The following changes were made today:  Stop amiodarone  Labs/ tests ordered today include:  Orders Placed This Encounter  Procedures  . EKG 12-Lead     Disposition:   FU with Saleena Tamas 3 months  Signed, Chandelle Harkey [name], MD  09/09/2017 2:09 PM     [facility] [address] [place] [address] [phone] (office) [phone] (fax)

## 2017-09-09 NOTE — Patient Instructions (Addendum)
Medication Instructions:  Your physician has recommended you make the following change in your medication:  STOP AMIODARONE  * If you need a refill on your cardiac medications before your next appointment, please call your pharmacy. *  Labwork: None ordered  Testing/Procedures: None ordered  Follow-Up: Your physician recommends that you schedule a follow-up appointment in: 3 months with Dr. Elberta Fortis on 12/23/2017 @ 2:00 pm in the [facility] office  Thank you for choosing [facility]!!   Dory Horn, RN [phone]

## 2017-09-15 DIAGNOSIS — I482 Chronic atrial fibrillation: Secondary | ICD-10-CM | POA: Diagnosis not present

## 2017-09-15 DIAGNOSIS — J40 Bronchitis, not specified as acute or chronic: Secondary | ICD-10-CM | POA: Diagnosis not present

## 2017-09-15 DIAGNOSIS — J449 Chronic obstructive pulmonary disease, unspecified: Secondary | ICD-10-CM | POA: Diagnosis not present

## 2017-09-15 DIAGNOSIS — R6889 Other general symptoms and signs: Secondary | ICD-10-CM | POA: Diagnosis not present

## 2017-11-06 ENCOUNTER — Ambulatory Visit: Payer: PPO | Admitting: Cardiology

## 2017-11-09 NOTE — Progress Notes (Signed)
Cardiology Office Note:    Date:  11/10/2017   ID:  Jennifer Martin, DOB August 11, 1956, MRN [id number]  PCP:  [name]., MD  Cardiologist:  [name], MD    Referring MD: [name]., MD    ASSESSMENT:    1. Chronic combined systolic and diastolic heart failure (HCC)   2. Hypertensive heart disease with heart failure (HCC)   3. Atrial flutter, unspecified type (HCC)   4. Paroxysmal atrial fibrillation (HCC)   5. Hyperlipidemia, unspecified hyperlipidemia type   6. Chronic anticoagulation    PLAN:    In order of problems listed above:  1. Stable compensated no fluid overload continue her current diuretic along with ARNI.  Note she is on a beta-blocker because of previous intense bronchospasm even with metoprolol. 2. Stable continue current treatment 3. Stable remains in sinus rhythm 4. Stable remains in sinus rhythm 5. Stable she takes a low intensity statin as she was intolerant of the other preparations will check labs today including lipid profile 6. Continue her anticoagulant I assured her we can manage it if he needs to be interrupted for surgical procedures   Next appointment: 3 months   Medication Adjustments/Labs and Tests Ordered: Current medicines are reviewed at length with the patient today.  Concerns regarding medicines are outlined above.  Orders Placed This Encounter  Procedures  . Comprehensive Metabolic Panel (CMET)  . B Nat Peptide  . ECHOCARDIOGRAM COMPLETE   No orders of the defined types were placed in this encounter.   Chief Complaint  Patient presents with  . Follow-up  . Congestive Heart Failure  . Atrial Fibrillation    History of Present Illness:    Jennifer Martin is a 62 y.o. female with a hx of CAD, Heart failure EF 35-40%,Dyslipidemia, HTN, PAF and  atrial flutterwith atrial fibrillation/flutter ablation on 05/29/17 and exacerbation of COPD with demand ischemia and decompensated heart failure last seen 08/06/17.She is not on a  beta blocker due to intense bronchospasm. Compliance with diet, lifestyle and medications: Yes Her cardiac perspective she is done well is pleased with the quality of her life has no chest pain dyspnea palpitations syncope or TIA.  Unfortunately she is having radicular pain weakness in the right foot.  I assured her she requires surgical intervention we could manage her anticoagulant perioperatively.  She is agreed to have a follow-up echocardiogram to assess her ejection fraction after resolution of atrial fibrillation optimization of medical treatment in the next 1-2 months depending on her back pain and progress with treatment. Past Medical History:  Diagnosis Date  . Anxiety   . Atherosclerosis of coronary artery of native heart with angina pectoris (HCC)   . Atrial flutter (HCC)   . CAD (coronary artery disease)   . COPD (chronic obstructive pulmonary disease) (HCC)   . Demand ischemia (HCC)   . Depression   . Diverticulosis   . Hyperlipidemia   . Hypertension   . Inappropriate ADH syndrome (HCC)   . Migraine    "none since ~ 2012; mild ones then when I did have them because of the beta blockers I was on" (06/09/2017)  . Myocardial infarction (HCC)    "I've had light ones" (06/09/2017)  . Osteoarthritis   . Pneumonia    "couple times" (06/09/2017)    Past Surgical History:  Procedure Laterality Date  . ATRIAL FIBRILLATION ABLATION N/A 05/29/2017   Procedure: Atrial Fibrillation Ablation;  Surgeon: Regan Lemming, MD;  Location: [facility] INVASIVE  CV LAB;  Service: Cardiovascular;  Laterality: N/A;  . BREAST SURGERY Left    "took a gland out; milk duct"  . CARDIAC CATHETERIZATION  2008   has had 2 procedures, the last one approx 2008, never had PCI/stent. Dr [name]  . CARPAL TUNNEL RELEASE Left   . DILATION AND CURETTAGE OF UTERUS     "related to heavy bleeding"  . INGUINAL HERNIA REPAIR Left   . SHOULDER ARTHROSCOPY WITH ROTATOR CUFF REPAIR Right   . TRACHEOSTOMY  2006    "closed on it's own"  . TUBAL LIGATION    . VAGINAL HYSTERECTOMY     "fibroids"    Current Medications: Current Meds  Medication Sig  . acetaminophen (TYLENOL) 500 MG tablet Take 1,000 mg by mouth 2 (two) times daily as needed for mild pain or headache.  . albuterol (PROVENTIL HFA;VENTOLIN HFA) 108 (90 Base) MCG/ACT inhaler Inhale 2 puffs into the lungs every 6 (six) hours as needed for wheezing or shortness of breath.  . budesonide-formoterol (SYMBICORT) 160-4.5 MCG/ACT inhaler Inhale 2 puffs into the lungs 2 (two) times daily.  Marland Kitchen escitalopram (LEXAPRO) 10 MG tablet Take 5 mg by mouth daily.  . fluticasone (FLONASE) 50 MCG/ACT nasal spray Place 2 sprays into both nostrils daily as needed for allergies.   Marland Kitchen LORazepam (ATIVAN) 1 MG tablet Take 1 mg by mouth every 8 (eight) hours as needed for anxiety.   . nitroGLYCERIN (NITROSTAT) 0.4 MG SL tablet Place 0.4 mg under the tongue every 5 (five) minutes as needed for chest pain.  . pantoprazole (PROTONIX) 40 MG tablet Take 40 mg by mouth daily.  . potassium chloride (K-DUR,KLOR-CON) 20 MEQ tablet Take 0.5 tablets (10 mEq total) by mouth 2 (two) times daily.  . rivaroxaban (XARELTO) 20 MG TABS tablet Take 1 tablet (20 mg total) daily by mouth.  . sacubitril-valsartan (ENTRESTO) 97-103 MG Take 1 tablet by mouth 2 (two) times daily.     Allergies:   Atorvastatin; Cefdinir; Levaquin [levofloxacin in d5w]; and Morphine and related   Social History   Socioeconomic History  . Marital status: Married    Spouse name: None  . Number of children: None  . Years of education: None  . Highest education level: None  Social Needs  . Financial resource strain: None  . Food insecurity - worry: None  . Food insecurity - inability: None  . Transportation needs - medical: None  . Transportation needs - non-medical: None  Occupational History  . None  Tobacco Use  . Smoking status: Current Every Day Smoker    Packs/day: 0.60    Years: 34.00    Pack  years: 20.40    Types: Cigarettes, E-cigarettes  . Smokeless tobacco: Never Used  Substance and Sexual Activity  . Alcohol use: No  . Drug use: No  . Sexual activity: Not Currently  Other Topics Concern  . None  Social History Narrative  . None     Family History: The patient's family history includes CAD in her brother; Heart disease in her brother. ROS:   Please see the history of present illness.    All other systems reviewed and are negative.  EKGs/Labs/Other Studies Reviewed:    The following studies were reviewed today:  Recent Labs: 04/20/2017: NT-Pro BNP 795 06/10/2017: Magnesium 2.1 06/12/2017: ALT 85 06/13/2017: Hemoglobin 11.9; Platelets 401 07/01/2017: BNP 122.5 08/06/2017: BUN 13; Creatinine, Ser 0.61; Potassium 4.4; Sodium 136; TSH 2.260  Recent Lipid Panel    Component Value Date/Time  CHOL 179 06/11/2017 1439   CHOL 138 04/20/2017 1133   TRIG 100 06/11/2017 1439   HDL 56 06/11/2017 1439   HDL 77 04/20/2017 1133   CHOLHDL 3.2 06/11/2017 1439   VLDL 20 06/11/2017 1439   LDLCALC 103 (H) 06/11/2017 1439   LDLCALC 44 04/20/2017 1133    Physical Exam:    VS:  BP 120/72 (BP Location: Right Arm, Patient Position: Sitting, Cuff Size: Normal)   Pulse 70   Ht 5' (1.524 m)   Wt 132 lb (59.9 kg)   SpO2 97%   BMI 25.78 kg/m     Wt Readings from Last 3 Encounters:  11/10/17 132 lb (59.9 kg)  09/09/17 127 lb 12.8 oz (58 kg)  08/06/17 128 lb (58.1 kg)     GEN:  Well nourished, well developed in no acute distress HEENT: Normal NECK: No JVD; No carotid bruits LYMPHATICS: No lymphadenopathy CARDIAC:  RRR, no murmurs, rubs, gallops RESPIRATORY:  Clear to auscultation without rales, wheezing or rhonchi  ABDOMEN: Soft, non-tender, non-distended MUSCULOSKELETAL:  No edema; No deformity  SKIN: Warm and dry NEUROLOGIC:  Alert and oriented x 3 PSYCHIATRIC:  Normal affect    Signed, [name], MD  11/10/2017 3:56 PM    [facility]

## 2017-11-10 ENCOUNTER — Encounter: Payer: Self-pay | Admitting: Cardiology

## 2017-11-10 ENCOUNTER — Ambulatory Visit: Payer: PPO | Admitting: Cardiology

## 2017-11-10 VITALS — BP 120/72 | HR 70 | Ht 60.0 in | Wt 132.0 lb

## 2017-11-10 DIAGNOSIS — E785 Hyperlipidemia, unspecified: Secondary | ICD-10-CM | POA: Diagnosis not present

## 2017-11-10 DIAGNOSIS — I5042 Chronic combined systolic (congestive) and diastolic (congestive) heart failure: Secondary | ICD-10-CM

## 2017-11-10 DIAGNOSIS — I480 Paroxysmal atrial fibrillation: Secondary | ICD-10-CM

## 2017-11-10 DIAGNOSIS — I110 Hypertensive heart disease with heart failure: Secondary | ICD-10-CM | POA: Diagnosis not present

## 2017-11-10 DIAGNOSIS — Z7901 Long term (current) use of anticoagulants: Secondary | ICD-10-CM

## 2017-11-10 DIAGNOSIS — I4892 Unspecified atrial flutter: Secondary | ICD-10-CM | POA: Diagnosis not present

## 2017-11-10 NOTE — Patient Instructions (Addendum)
Medication Instructions:  Your physician recommends that you continue on your current medications as directed. Please refer to the Current Medication list given to you today.  Labwork: Your physician recommends that you return for lab work in: today. CBC, BNP, lipid  Testing/Procedures: Your physician has requested that you have an echocardiogram. Echocardiography is a painless test that uses sound waves to create images of your heart. It provides your doctor with information about the size and shape of your heart and how well your heart's chambers and valves are working. This procedure takes approximately one hour. There are no restrictions for this procedure. In 2 months.  Follow-Up: Your physician recommends that you schedule a follow-up appointment in: 3 months.  Any Other Special Instructions Will Be Listed Below (If Applicable).     If you need a refill on your cardiac medications before your next appointment, please call your pharmacy.

## 2017-11-11 ENCOUNTER — Other Ambulatory Visit: Payer: Self-pay | Admitting: Cardiology

## 2017-11-11 LAB — LIPID PANEL
CHOL/HDL RATIO: 2.1 ratio (ref 0.0–4.4)
Cholesterol, Total: 217 mg/dL — ABNORMAL HIGH (ref 100–199)
HDL: 101 mg/dL (ref >39)
LDL Calculated: 104 mg/dL — ABNORMAL HIGH (ref 0–99)
Triglycerides: 61 mg/dL (ref 0–149)
VLDL Cholesterol Cal: 12 mg/dL (ref 5–40)

## 2017-11-11 LAB — COMPREHENSIVE METABOLIC PANEL
ALBUMIN: 4.6 g/dL (ref 3.6–4.8)
ALT: 19 IU/L (ref 0–32)
AST: 21 IU/L (ref 0–40)
Albumin/Globulin Ratio: 1.8 (ref 1.2–2.2)
Alkaline Phosphatase: 76 IU/L (ref 39–117)
BUN / CREAT RATIO: 20 (ref 12–28)
BUN: 12 mg/dL (ref 8–27)
Bilirubin Total: 0.3 mg/dL (ref 0.0–1.2)
CALCIUM: 9.4 mg/dL (ref 8.7–10.3)
CO2: 25 mmol/L (ref 20–29)
Chloride: 89 mmol/L — ABNORMAL LOW (ref 96–106)
Creatinine, Ser: 0.6 mg/dL (ref 0.57–1.00)
GFR, EST AFRICAN AMERICAN: 112 mL/min/{1.73_m2} (ref >59)
GFR, EST NON AFRICAN AMERICAN: 97 mL/min/{1.73_m2} (ref >59)
GLOBULIN, TOTAL: 2.6 g/dL (ref 1.5–4.5)
Glucose: 156 mg/dL — ABNORMAL HIGH (ref 65–99)
POTASSIUM: 4.1 mmol/L (ref 3.5–5.2)
SODIUM: 130 mmol/L — AB (ref 134–144)
TOTAL PROTEIN: 7.2 g/dL (ref 6.0–8.5)

## 2017-11-11 LAB — BRAIN NATRIURETIC PEPTIDE: BNP: 107.6 pg/mL — ABNORMAL HIGH (ref 0.0–100.0)

## 2017-11-13 DIAGNOSIS — F419 Anxiety disorder, unspecified: Secondary | ICD-10-CM | POA: Diagnosis not present

## 2017-11-13 DIAGNOSIS — M5431 Sciatica, right side: Secondary | ICD-10-CM | POA: Diagnosis not present

## 2017-11-13 DIAGNOSIS — M25551 Pain in right hip: Secondary | ICD-10-CM | POA: Diagnosis not present

## 2017-11-13 DIAGNOSIS — M5416 Radiculopathy, lumbar region: Secondary | ICD-10-CM | POA: Diagnosis not present

## 2017-11-19 DIAGNOSIS — M5416 Radiculopathy, lumbar region: Secondary | ICD-10-CM | POA: Diagnosis not present

## 2017-11-19 DIAGNOSIS — M545 Low back pain: Secondary | ICD-10-CM | POA: Diagnosis not present

## 2017-11-24 DIAGNOSIS — M5416 Radiculopathy, lumbar region: Secondary | ICD-10-CM | POA: Diagnosis not present

## 2017-11-25 ENCOUNTER — Telehealth: Payer: Self-pay | Admitting: Cardiology

## 2017-11-25 NOTE — Telephone Encounter (Signed)
Please advise 

## 2017-11-25 NOTE — Telephone Encounter (Signed)
Patient advised to hold Xarelto 3 days prior to procedure and 2 days after procedure. Patient verbalized understanding, no further questions.

## 2017-11-25 NOTE — Telephone Encounter (Signed)
Patient is needing to have an epidural and wants to know if "ok" to go off Xarelto for day before so she can have done bt Dr. Evelene Croon. Please call patient na advise.

## 2017-11-25 NOTE — Telephone Encounter (Signed)
Yes, 3 days pre and 2 after

## 2017-11-26 DIAGNOSIS — M5416 Radiculopathy, lumbar region: Secondary | ICD-10-CM | POA: Diagnosis not present

## 2017-12-04 DIAGNOSIS — M5416 Radiculopathy, lumbar region: Secondary | ICD-10-CM | POA: Diagnosis not present

## 2017-12-04 DIAGNOSIS — M48062 Spinal stenosis, lumbar region with neurogenic claudication: Secondary | ICD-10-CM | POA: Diagnosis not present

## 2017-12-15 ENCOUNTER — Ambulatory Visit (HOSPITAL_BASED_OUTPATIENT_CLINIC_OR_DEPARTMENT_OTHER)
Admission: RE | Admit: 2017-12-15 | Discharge: 2017-12-15 | Disposition: A | Discharge status: Home / Self Care | Payer: PPO | Source: Ambulatory Visit | Attending: Cardiology | Admitting: Cardiology

## 2017-12-15 DIAGNOSIS — E785 Hyperlipidemia, unspecified: Secondary | ICD-10-CM | POA: Insufficient documentation

## 2017-12-15 DIAGNOSIS — I5042 Chronic combined systolic (congestive) and diastolic (congestive) heart failure: Secondary | ICD-10-CM | POA: Diagnosis not present

## 2017-12-15 DIAGNOSIS — I080 Rheumatic disorders of both mitral and aortic valves: Secondary | ICD-10-CM | POA: Diagnosis not present

## 2017-12-15 NOTE — Progress Notes (Signed)
Echocardiogram 2D Echocardiogram has been performed.  Jennifer Martin 12/15/2017, 12:09 PM

## 2017-12-23 ENCOUNTER — Encounter: Payer: Self-pay | Admitting: Cardiology

## 2017-12-23 ENCOUNTER — Ambulatory Visit (INDEPENDENT_AMBULATORY_CARE_PROVIDER_SITE_OTHER): Payer: PPO | Admitting: Cardiology

## 2017-12-23 VITALS — BP 111/78 | Ht 60.0 in | Wt 129.0 lb

## 2017-12-23 DIAGNOSIS — I10 Essential (primary) hypertension: Secondary | ICD-10-CM | POA: Diagnosis not present

## 2017-12-23 DIAGNOSIS — E785 Hyperlipidemia, unspecified: Secondary | ICD-10-CM | POA: Diagnosis not present

## 2017-12-23 DIAGNOSIS — I2510 Atherosclerotic heart disease of native coronary artery without angina pectoris: Secondary | ICD-10-CM | POA: Diagnosis not present

## 2017-12-23 DIAGNOSIS — I483 Typical atrial flutter: Secondary | ICD-10-CM | POA: Diagnosis not present

## 2017-12-23 DIAGNOSIS — I4819 Other persistent atrial fibrillation: Secondary | ICD-10-CM

## 2017-12-23 DIAGNOSIS — I255 Ischemic cardiomyopathy: Secondary | ICD-10-CM

## 2017-12-23 DIAGNOSIS — I481 Persistent atrial fibrillation: Secondary | ICD-10-CM | POA: Diagnosis not present

## 2017-12-23 IMAGING — DX DG CHEST 2V
2 series · 2 of 2 positions shown · non-contrast
Comparison: Limited CT chest May 27, 2017

CLINICAL DATA: Shortness of breath.  Near syncope.

EXAM:
CHEST  2 VIEW

[chest pa]
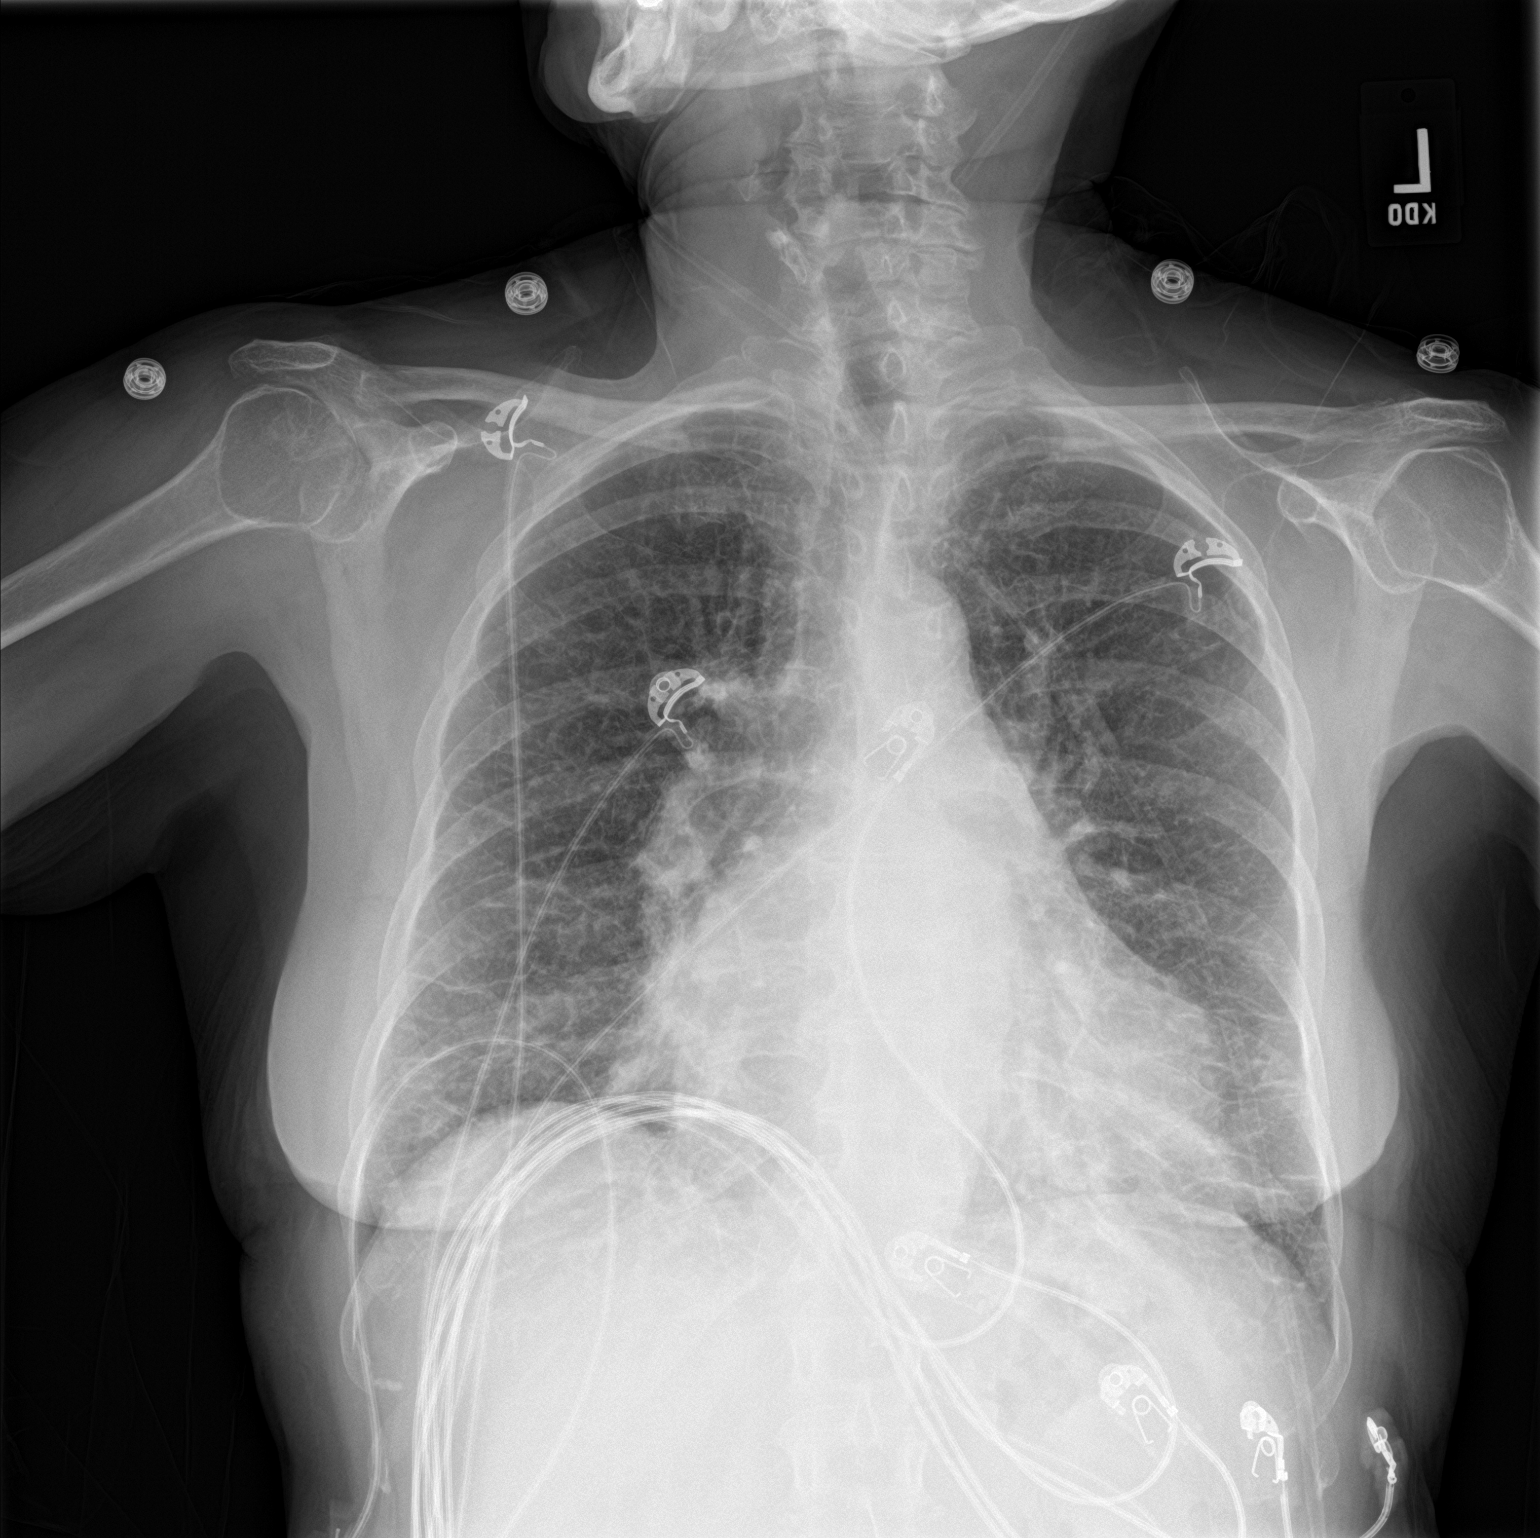

[chest lat]
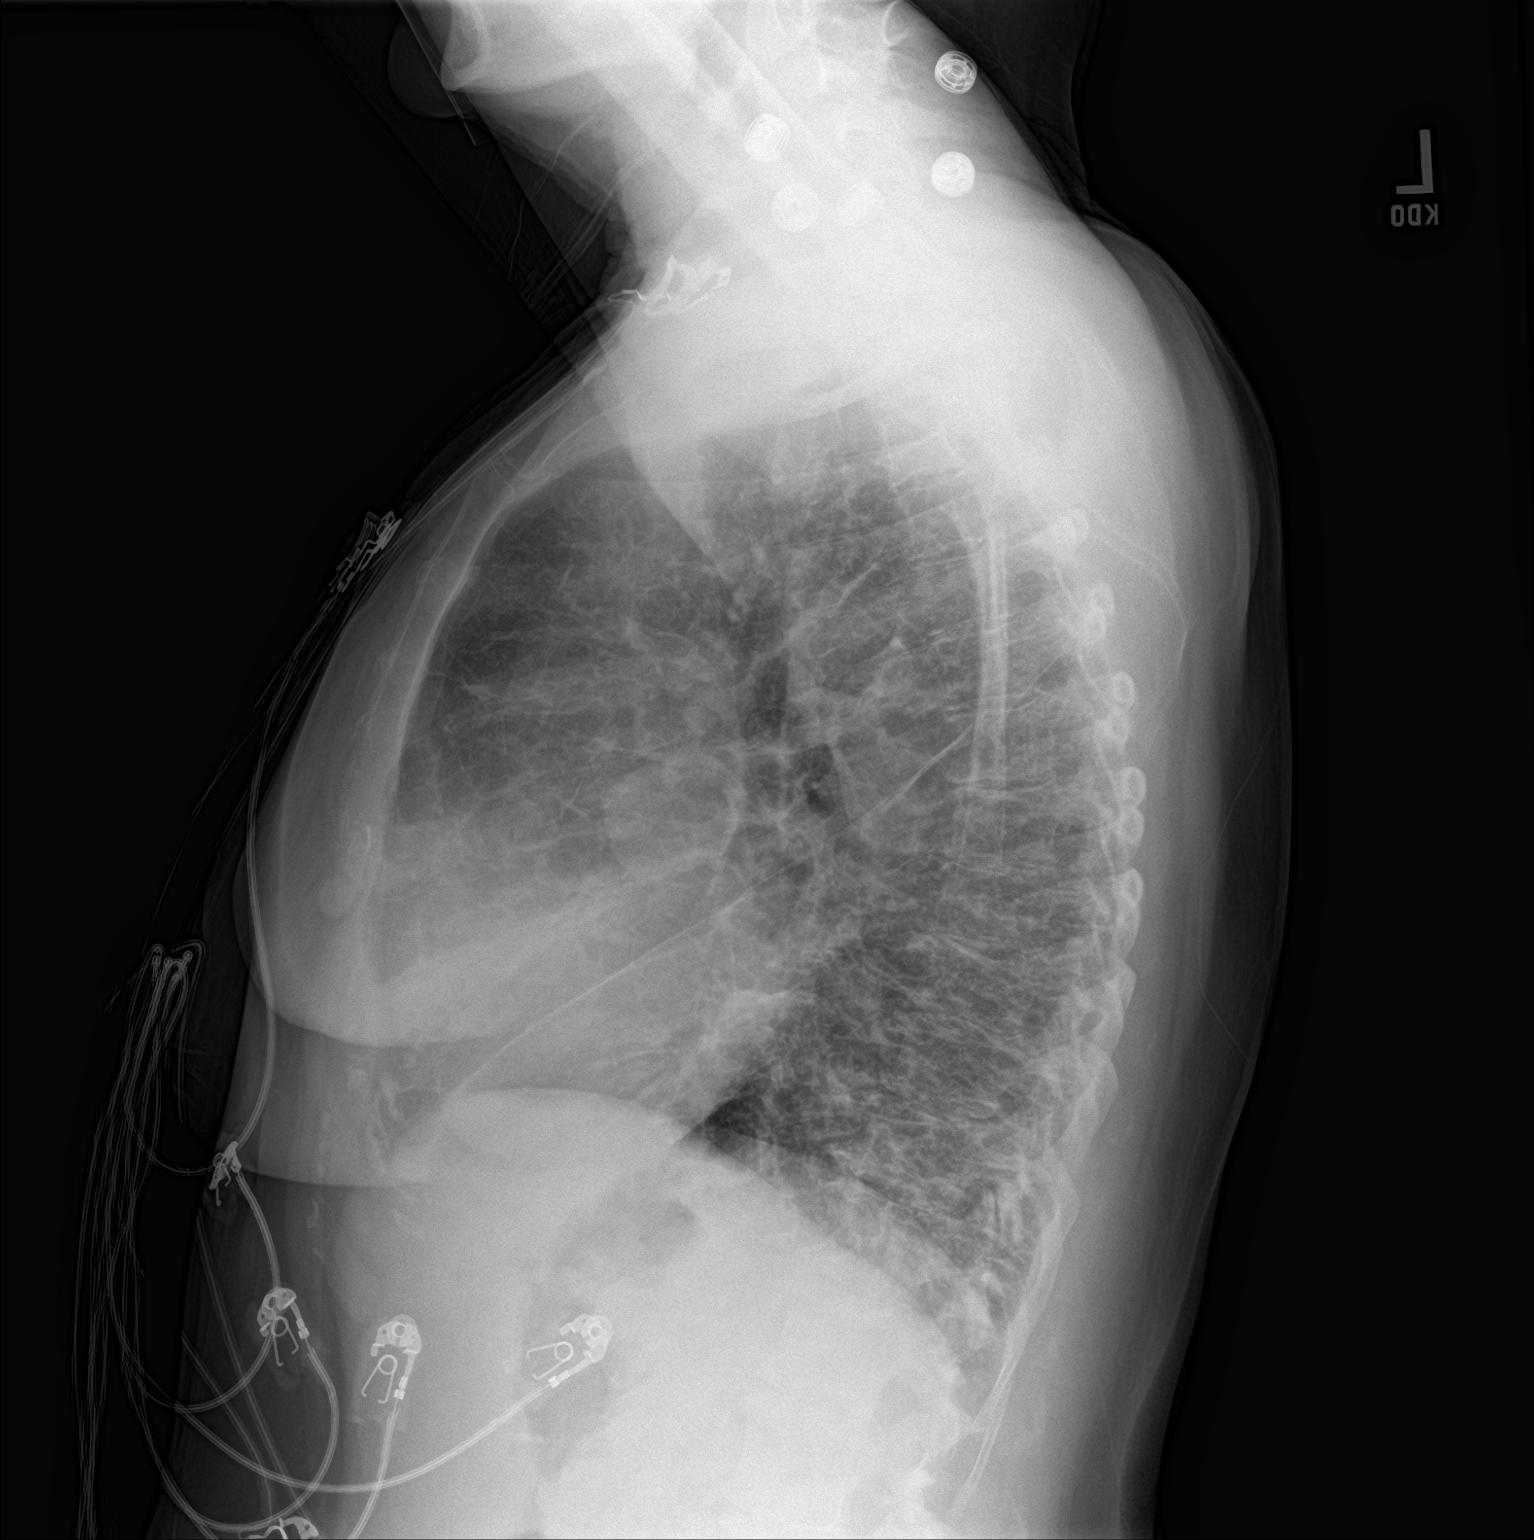

[2 of 2 positions shown; findings below may reference images not displayed]

FINDINGS: The cardiac silhouette is moderately enlarged, mediastinal
silhouette is nonsuspicious. Diffuse interstitial prominence with
Kerley B-lines. No pleural effusion or focal consolidation. No
pneumothorax. Soft tissue planes and included osseous structures are
nonsuspicious.
IMPRESSION: Moderate cardiomegaly. Interstitial prominence most consistent with
pulmonary edema.

## 2017-12-23 NOTE — Progress Notes (Signed)
Electrophysiology Office Note   Date:  12/23/2017   ID:  Jennifer Martin, DOB Apr 26, 1956, MRN [id number]  PCP:  [name]., MD  Cardiologist:  [name] Primary Electrophysiologist:  [name] [name], MD    No chief complaint on file.    History of Present Illness: Jennifer Martin is a 62 y.o. female who is being seen today for the evaluation of atrial flutter at the request of [name]. Presenting today for electrophysiology evaluation. She has a history of coronary artery disease, hyperlipidemia, hypertension, and atrial flutter. She also has COPD with a demand ischemia and heart failure. She was admitted to [facility] in atrial fibrillation in April. She has no angina only mild shortness of breath, felt related to her COPD. She has not had syncope or bleeding complications of her anticoagulants. She was placed on amiodarone during her hospitalization in April.  He had an atrial fibrillation ablation 05/29/17.  Today, denies symptoms of  chest pain, shortness of breath, orthopnea, PND, lower extremity edema, claudication, dizziness, presyncope, syncope, bleeding, or neurologic sequela. The patient is tolerating medications without difficulties.  She is felt no further episodes of atrial fibrillation.  She does have palpitations at times.  They happen a few times a month and last a few seconds.  Past Medical History:  Diagnosis Date  . Anxiety   . Atherosclerosis of coronary artery of native heart with angina pectoris (HCC)   . Atrial flutter (HCC)   . CAD (coronary artery disease)   . COPD (chronic obstructive pulmonary disease) (HCC)   . Demand ischemia (HCC)   . Depression   . Diverticulosis   . Hyperlipidemia   . Hypertension   . Inappropriate ADH syndrome (HCC)   . Migraine    "none since ~ 2012; mild ones then when I did have them because of the beta blockers I was on" (06/09/2017)  . Myocardial infarction (HCC)    "I've had light ones" (06/09/2017)  .  Osteoarthritis   . Pneumonia    "couple times" (06/09/2017)   Past Surgical History:  Procedure Laterality Date  . ATRIAL FIBRILLATION ABLATION N/A 05/29/2017   Procedure: Atrial Fibrillation Ablation;  Surgeon: Regan Lemming, MD;  Location: [facility] INVASIVE CV LAB;  Service: Cardiovascular;  Laterality: N/A;  . BREAST SURGERY Left    "took a gland out; milk duct"  . CARDIAC CATHETERIZATION  2008   has had 2 procedures, the last one approx 2008, never had PCI/stent. Dr [name]  . CARPAL TUNNEL RELEASE Left   . DILATION AND CURETTAGE OF UTERUS     "related to heavy bleeding"  . INGUINAL HERNIA REPAIR Left   . SHOULDER ARTHROSCOPY WITH ROTATOR CUFF REPAIR Right   . TRACHEOSTOMY  2006   "closed on it's own"  . TUBAL LIGATION    . VAGINAL HYSTERECTOMY     "fibroids"     Current Outpatient Medications  Medication Sig Dispense Refill  . acetaminophen (TYLENOL) 500 MG tablet Take 1,000 mg by mouth 2 (two) times daily as needed for mild pain or headache.    . albuterol (PROVENTIL HFA;VENTOLIN HFA) 108 (90 Base) MCG/ACT inhaler Inhale 2 puffs into the lungs every 6 (six) hours as needed for wheezing or shortness of breath.    . budesonide-formoterol (SYMBICORT) 160-4.5 MCG/ACT inhaler Inhale 2 puffs into the lungs 2 (two) times daily.    Marland Kitchen escitalopram (LEXAPRO) 10 MG tablet Take 5 mg by mouth daily.    . fluticasone (FLONASE) 50  MCG/ACT nasal spray Place 2 sprays into both nostrils daily as needed for allergies.     . furosemide (LASIX) 20 MG tablet Take 1 tablet (20 mg total) by mouth 2 (two) times daily. Take only one daily if you weigh 124l bs or less 30 tablet 11  . LORazepam (ATIVAN) 1 MG tablet Take 1 mg by mouth every 8 (eight) hours as needed for anxiety.     . nitroGLYCERIN (NITROSTAT) 0.4 MG SL tablet Place 0.4 mg under the tongue every 5 (five) minutes as needed for chest pain.    . pantoprazole (PROTONIX) 40 MG tablet Take 40 mg by mouth daily.    . potassium chloride  (K-DUR,KLOR-CON) 20 MEQ tablet Take 0.5 tablets (10 mEq total) by mouth 2 (two) times daily. 30 tablet 6  . pravastatin (PRAVACHOL) 20 MG tablet Take 1 tablet (20 mg total) by mouth every evening. 90 tablet 3  . rivaroxaban (XARELTO) 20 MG TABS tablet Take 1 tablet (20 mg total) daily by mouth. 90 tablet 3  . sacubitril-valsartan (ENTRESTO) 97-103 MG Take 1 tablet by mouth 2 (two) times daily. 60 tablet 6   No current facility-administered medications for this visit.     Allergies:   Atorvastatin; Cefdinir; Levaquin [levofloxacin in d5w]; and Morphine and related   Social History:  The patient  reports that she has been smoking cigarettes and e-cigarettes.  She has a 20.40 pack-year smoking history. she has never used smokeless tobacco. She reports that she does not drink alcohol or use drugs.   Family History:  The patient's family history includes CAD in her brother; Heart disease in her brother.   ROS:  Please see the history of present illness.   Otherwise, review of systems is positive for palpitations.   All other systems are reviewed and negative.   PHYSICAL EXAM: VS:  There were no vitals taken for this visit. , BMI There is no height or weight on file to calculate BMI. GEN: Well nourished, well developed, in no acute distress  HEENT: normal  Neck: no JVD, carotid bruits, or masses Cardiac: iRRR; no murmurs, rubs, or gallops,no edema  Respiratory:  clear to auscultation bilaterally, normal work of breathing GI: soft, nontender, nondistended, + BS MS: no deformity or atrophy  Skin: warm and dry Neuro:  Strength and sensation are intact Psych: euthymic mood, full affect  EKG:  EKG is not ordered today. Personal review of the ekg ordered 09/09/17 shows SR, LBBB, rate 63   Recent Labs: 04/20/2017: NT-Pro BNP 795 06/10/2017: Magnesium 2.1 06/13/2017: Hemoglobin 11.9; Platelets 401 08/06/2017: TSH 2.260 11/10/2017: ALT 19; BNP 107.6; BUN 12; Creatinine, Ser 0.60; Potassium 4.1;  Sodium 130    Lipid Panel     Component Value Date/Time   CHOL 217 (H) 11/10/2017 1620   TRIG 61 11/10/2017 1620   HDL 101 11/10/2017 1620   CHOLHDL 2.1 11/10/2017 1620   CHOLHDL 3.2 06/11/2017 1439   VLDL 20 06/11/2017 1439   LDLCALC 104 (H) 11/10/2017 1620     Wt Readings from Last 3 Encounters:  11/10/17 132 lb (59.9 kg)  09/09/17 127 lb 12.8 oz (58 kg)  08/06/17 128 lb (58.1 kg)      Other studies Reviewed: Additional studies/ records that were reviewed today include: TTE 12/15/17 - Left ventricle: The cavity size was normal. Wall thickness was   increased in a pattern of mild LVH. Systolic function was normal.   Wall motion was normal; there were no regional wall motion  abnormalities. Doppler parameters are consistent with abnormal   left ventricular relaxation (grade 1 diastolic dysfunction). - Aortic valve: There was mild regurgitation. Valve area (VTI):   3.03 cm^2. Valve area (Vmax): 2.51 cm^2. Valve area (Vmean): 2.51   cm^2. - Mitral valve: There was mild regurgitation. - Left atrium: The atrium was moderately dilated.  ASSESSMENT AND PLAN:  1.  Atrial fibrillation/flutter: He on Xarelto.  Status post atrial fibrillation/flutter ablation 05/29/17.  Was taken off of amiodarone at her last visit.  He has had no further episodes of atrial fibrillation.  This patients CHA2DS2-VASc Score and unadjusted Ischemic Stroke Rate (% per year) is equal to 3.2 % stroke rate/year from a score of 3  Above score calculated as 1 point each if present [CHF, HTN, DM, Vascular=MI/PAD/Aortic Plaque, Age if 65-74, or Female] Above score calculated as 2 points each if present [Age > 75, or Stroke/TIA/TE]  2. Hypertensive heart disease with heart failure: No current signs of volume overload.  On optimal medical therapy.  No changes at this time.  3. Hyperlipidemia: Continue statin  4. Coronary artery disease with stable angina: No chest pain.  Continue Plavix  5.  Palpitations:  Likely due to APCs or PVCs based on auscultation.  No changes at this time.  Should she continue to have worse symptoms, would benefit from a 48-hour monitor.  Current medicines are reviewed at length with the patient today.   The patient does not have concerns regarding her medicines.  The following changes were made today: None  Labs/ tests ordered today include:  No orders of the defined types were placed in this encounter.    Disposition:   FU with Zaina Jenkin 6 months  Signed, Bastian Andreoli [name], MD  12/23/2017 9:47 AM     [facility] [address] [place] [address] [phone] (office) [phone] (fax)

## 2017-12-23 NOTE — Patient Instructions (Signed)
Medication Instructions:  Your physician recommends that you continue on your current medications as directed. Please refer to the Current Medication list given to you today.  If you need a refill on your cardiac medications before your next appointment, please call your pharmacy.   Labwork: None ordered  Testing/Procedures: None ordered  Follow-Up: Your physician wants you to follow-up in: 6 months  with Dr. Camnitz.  You will receive a reminder letter in the mail two months in advance. If you don't receive a letter, please call our office to schedule the follow-up appointment.  Thank you for choosing [facility]!!   Allyce Bochicchio, RN [phone]  Any Other Special Instructions Will Be Listed Below (If Applicable).        

## 2017-12-24 IMAGING — CT CT ANGIO CHEST
2 of 8 series · 18 of 36 positions shown · IV contrast (OMNI)
Comparison: Chest radiograph earlier this day.  Chest CT 05/27/2017

CLINICAL DATA: Shortness of breath and near syncope. Pulmonary
embolus suspected, intermediate probability. Elevated D-dimer.
Recent atrial fibrillation ablation.

EXAM:
CT ANGIOGRAPHY CHEST WITH CONTRAST
TECHNIQUE: Multidetector CT imaging of the chest was performed using the
standard protocol during bolus administration of intravenous
contrast. Multiplanar CT image reconstructions and MIPs were
obtained to evaluate the vascular anatomy.
CONTRAST:  80 cc Isovue 370 IV

[Series 6: thins · axial · 0.61mm/px · z∈[+1184,+1430]mm · 17 of 276 slices shown]
[im 15/276  lung]
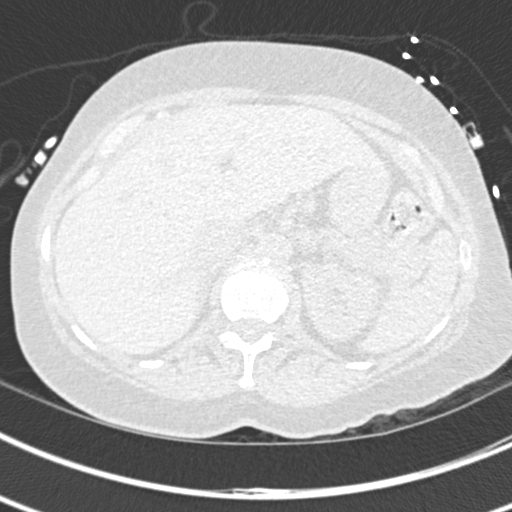
[im 29/276  mediastinal]
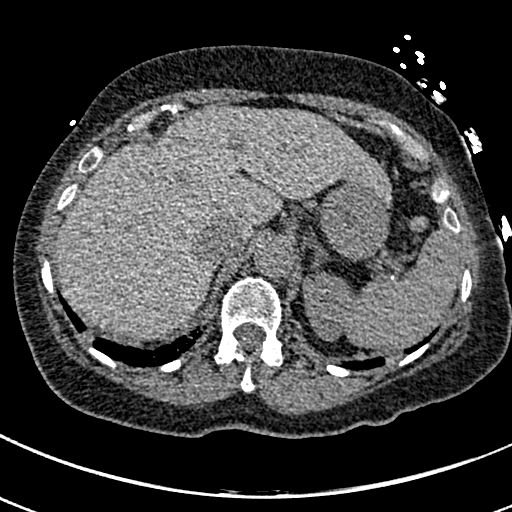
[im 44/276  lung]
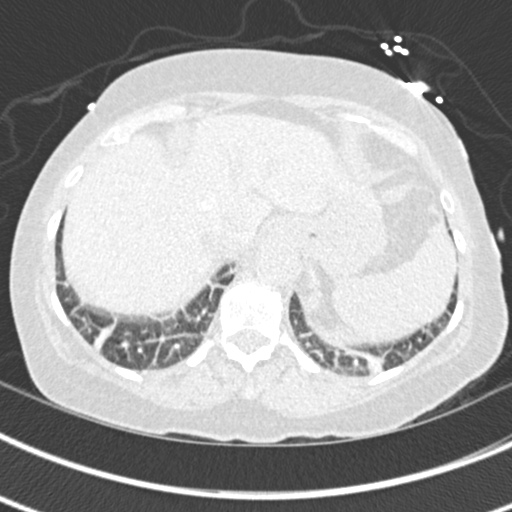
[im 58/276  mediastinal]
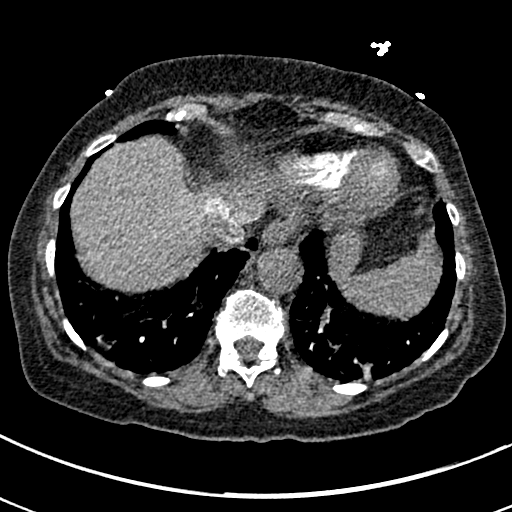
[im 73/276  lung]
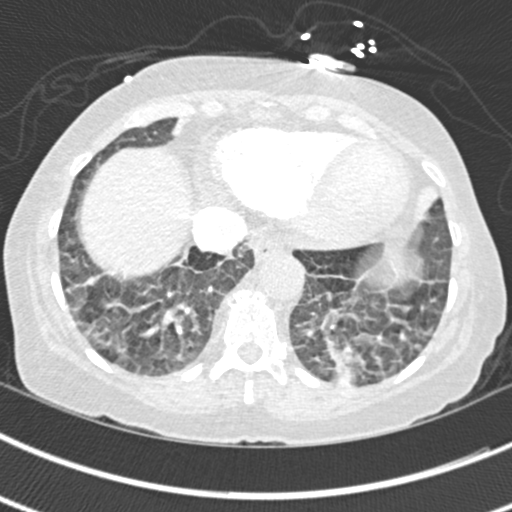
[im 87/276  mediastinal]
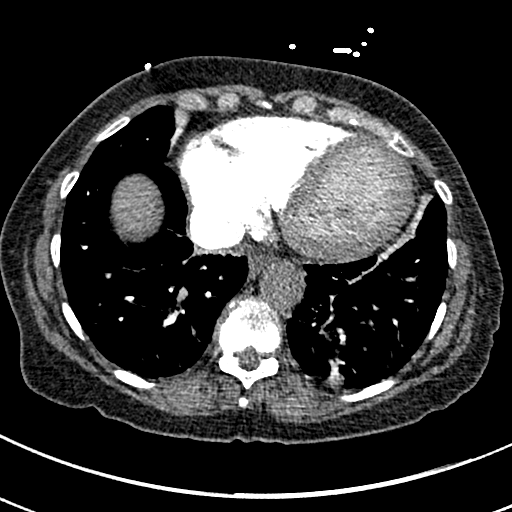
[im 102/276  lung]
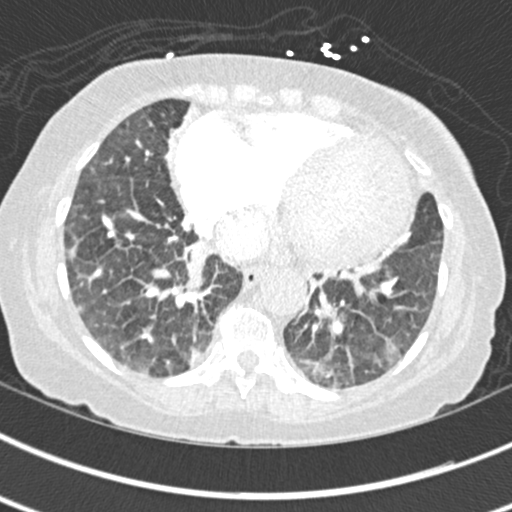
[im 116/276  mediastinal]
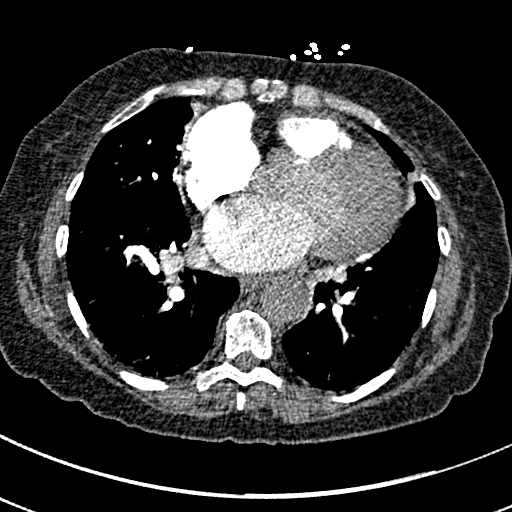
[im 145/276  lung]
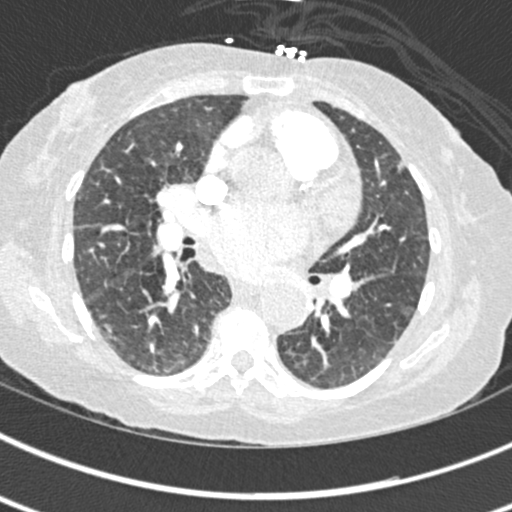
[im 160/276  mediastinal]
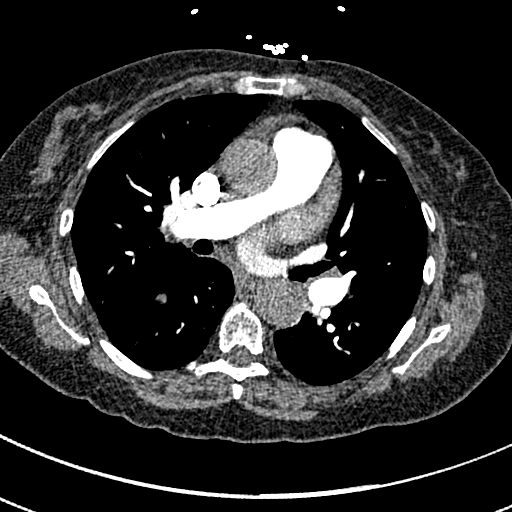
[im 174/276  lung]
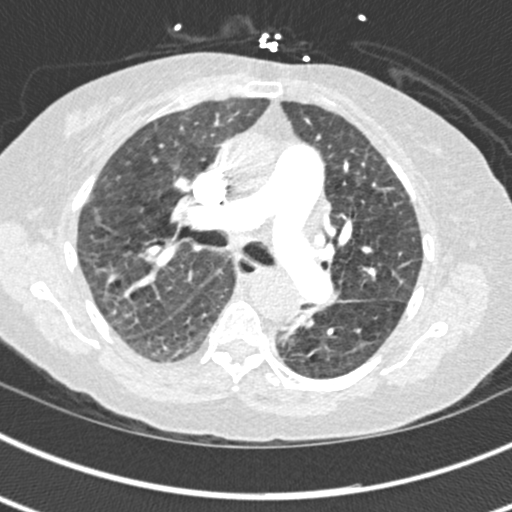
[im 189/276  mediastinal]
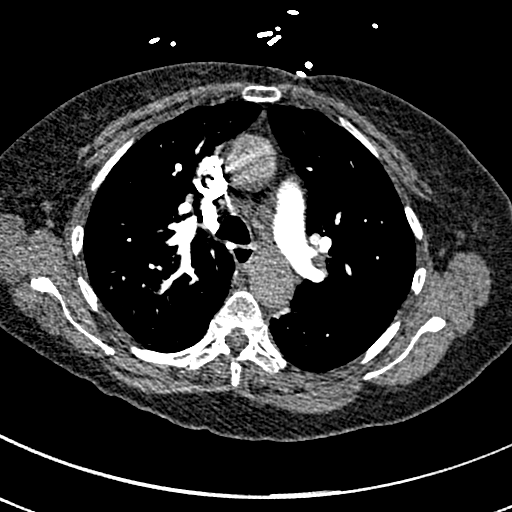
[im 203/276  lung]
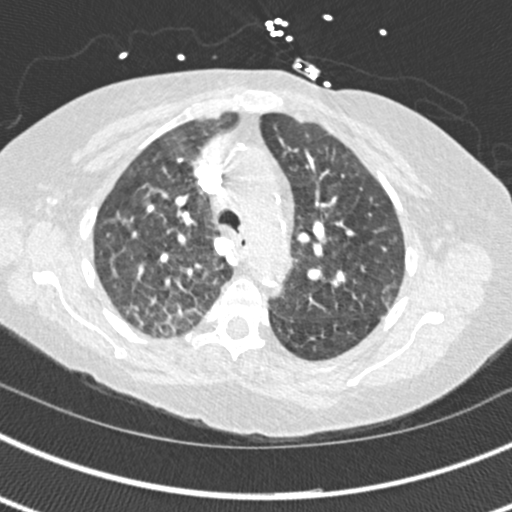
[im 218/276  mediastinal]
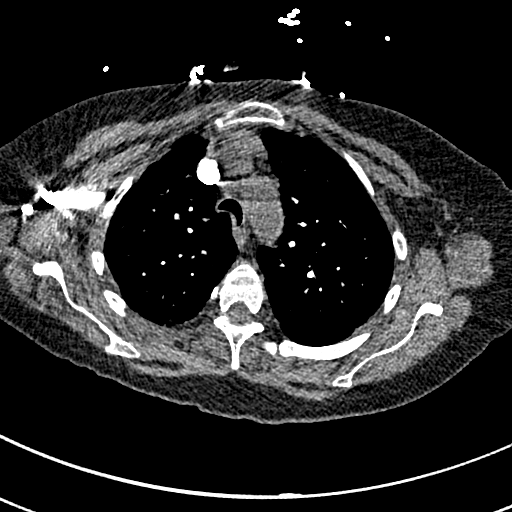
[im 232/276  lung]
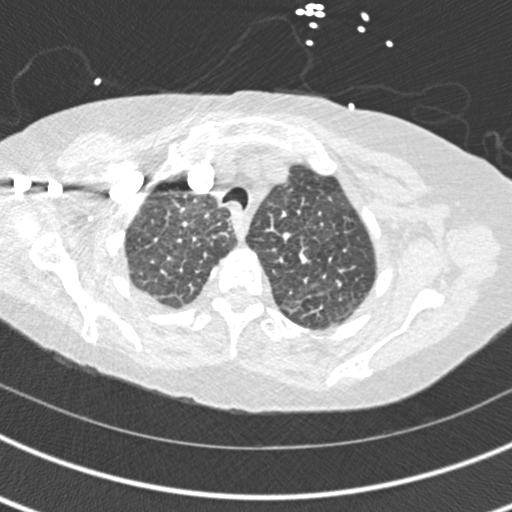
[im 247/276  mediastinal]
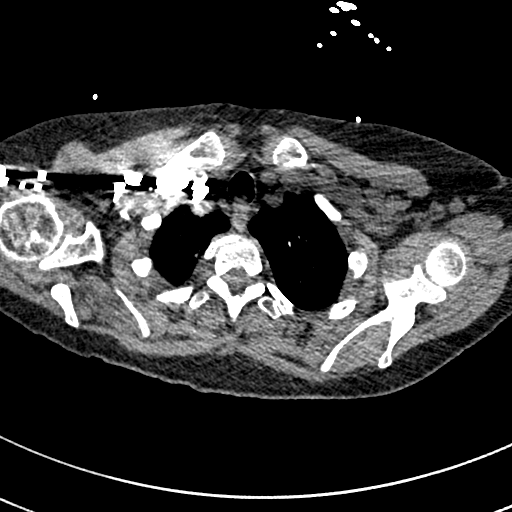
[im 261/276  lung]
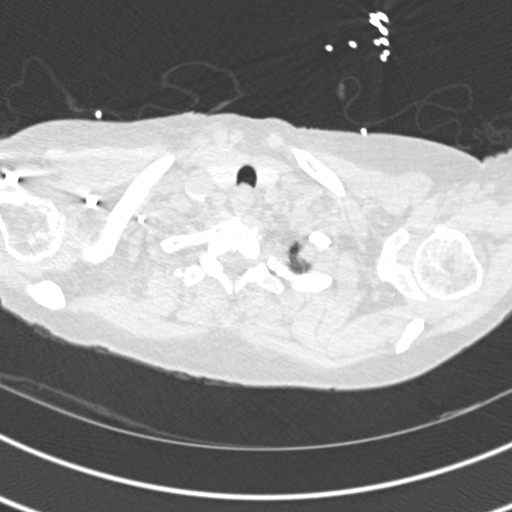

[Series 8: coronal mpr · coronal · 0.57mm/px · 1 of 121 slices shown]
[im 61/121  mediastinal]
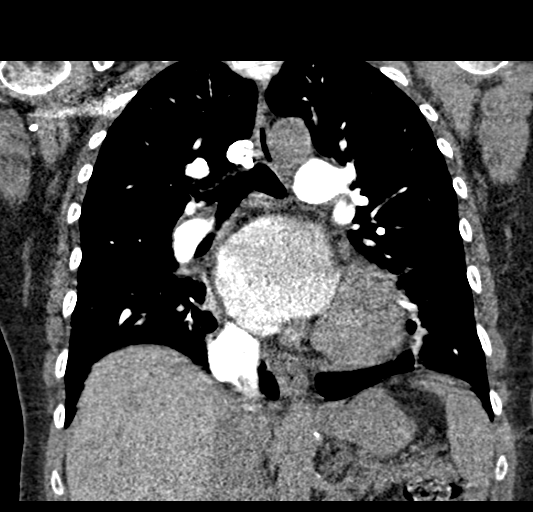

[18 of 36 positions shown; findings below may reference images not displayed]

FINDINGS: Cardiovascular: There are no filling defects within the pulmonary
arteries to suggest pulmonary embolus. Atherosclerosis of the
thoracic aorta. Coronary artery calcifications. The heart is
enlarged with right and left heart dilatation.

Mediastinum/Nodes: No mediastinal or hilar adenopathy. The esophagus
is decompressed. No pericardial effusion. Visualized thyroid gland
normal.

Lungs/Pleura: Smooth septal thickening consistent with pulmonary
edema, most prominent in the lower lobes. Superimposed lower lobe
linear atelectasis. Unchanged smoothly marginated 9 mm right lower
lobe pulmonary nodule abutting the major fissure. No new pulmonary
nodule. Mild underlying emphysema and central bronchial thickening.
No pleural fluid.

Upper Abdomen: No acute abnormality.

Musculoskeletal: There are no acute or suspicious osseous
abnormalities.

Review of the MIP images confirms the above findings.
IMPRESSION: 1. No pulmonary embolus.
2. Cardiomegaly with pulmonary edema.
3. Emphysema with central bronchial thickening.
4. Aortic Atherosclerosis ([id number]). Coronary artery
calcifications.
5. Unchanged smoothly marginated 9 mm fissure based nodule in the
superior segment of the right lower lobe, possible intrapulmonary
lymph node. Follow-up recommendations as before.

## 2017-12-25 IMAGING — DX DG CHEST 1V PORT
1 series · 1 of 1 positions shown · non-contrast
Comparison: PA and lateral chest 06/08/2017 and CT chest
06/09/2017.

CLINICAL DATA: Shortness of breath today.

EXAM:
PORTABLE CHEST 1 VIEW

[chest ap]
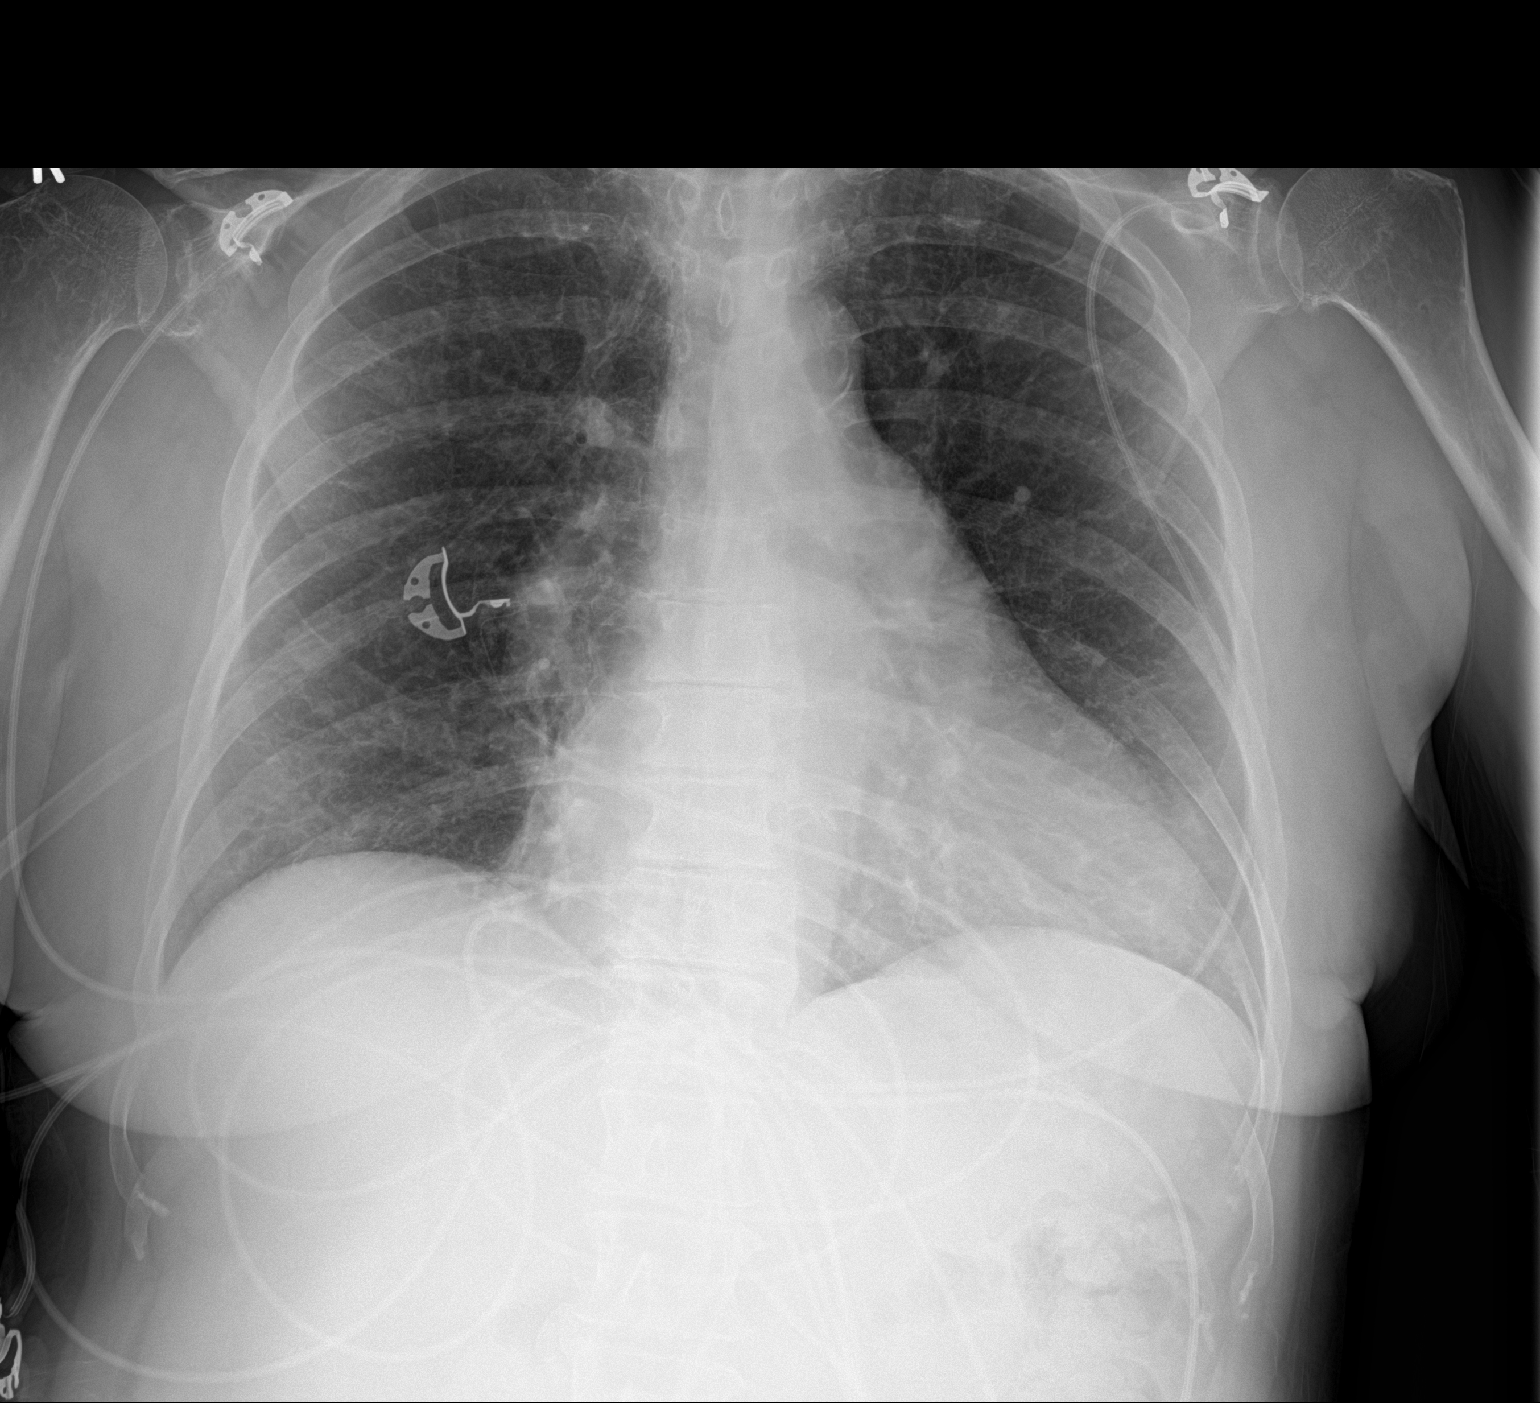

[1 of 1 positions shown; findings below may reference images not displayed]

FINDINGS: There is cardiomegaly without edema. No pneumothorax or pleural
effusion. Atherosclerosis noted. No acute bony abnormality.
IMPRESSION: Cardiomegaly without acute disease.

## 2018-01-04 ENCOUNTER — Ambulatory Visit (HOSPITAL_BASED_OUTPATIENT_CLINIC_OR_DEPARTMENT_OTHER): Payer: PPO

## 2018-01-13 ENCOUNTER — Encounter: Payer: Self-pay | Admitting: Cardiology

## 2018-01-13 DIAGNOSIS — R002 Palpitations: Secondary | ICD-10-CM

## 2018-01-13 HISTORY — DX: Palpitations: R00.2

## 2018-01-13 NOTE — Progress Notes (Signed)
Cardiology Office Note:    Date:  01/14/2018   ID:  Jennifer Martin, DOB 04-08-1956, MRN [id number]  PCP:  [name]., MD  Cardiologist:  [name], MD    Referring MD: [name]., MD    ASSESSMENT:    1. Palpitation   2. Paroxysmal atrial fibrillation (HCC)   3. Chronic combined systolic and diastolic heart failure (HCC)   4. Hypertensive heart disease with heart failure (HCC)   5. Chronic anticoagulation    PLAN:    In order of problems listed above:  1. At this time I do not think she is having recurrent atrial fibrillation we discussed utilizing an event monitor and she will buy a adapter for the smart phone to record her heart rhythm for reassurance.  If she was having recurrent atrial arrhythmia I would favor antiarrhythmic therapy with either Multaq or propafenone. 2. Stable no recurrence after pulmonary vein isolation continue her anticoagulant 3. Stable compensated ejection fraction is normalized no longer in a beta-blocker with underlying COPD and continue ARNI and diuretic 4. Stable blood pressure target continue current treatment 5. Continue her current anticoagulant.   Next appointment: 3 months   Medication Adjustments/Labs and Tests Ordered: Current medicines are reviewed at length with the patient today.  Concerns regarding medicines are outlined above.  Orders Placed This Encounter  Procedures  . Basic metabolic panel  . Magnesium  . EKG 12-Lead   No orders of the defined types were placed in this encounter.   Chief Complaint  Patient presents with  . Follow-up    to discuss irregular heart rate    History of Present Illness:    Jennifer Martin is a 62 y.o. female with a hx of CAD, Heart failure EF 35-40%,Dyslipidemia, HTN, PAF and  atrial flutter status post atrial fibrillation/flutter ablation 05/29/17. with atrial fibrillation/flutter ablation on 05/29/17 and exacerbation of COPD with demand ischemia and decompensated heart failure  last seen 11/13/17.  ASSESSMENT:    11/13/17   1. Chronic combined systolic and diastolic heart failure (HCC)   2. Hypertensive heart disease with heart failure (HCC)   3. Atrial flutter, unspecified type (HCC)   4. Paroxysmal atrial fibrillation (HCC)   5. Hyperlipidemia, unspecified hyperlipidemia type   6. Chronic anticoagulation    PLAN:    In order of problems listed above:  1.   Stable compensated no fluid overload continue her current diuretic along with ARNI.  Note she is on a beta-blocker because of previous intense bronchospasm even with metoprolol. 6. Stable continue current treatment 7. Stable remains in sinus rhythm 8. Stable remains in sinus rhythm 9. Stable she takes a low intensity statin as she was intolerant of the other preparations will check labs today including lipid profile 10. Continue her anticoagulant I assured her we can manage it if he needs to be interrupted for surgical procedures  Compliance with diet, lifestyle and medications: Yes She moved up her appointment she is having brief episodes of palpitation not severe sustained it is increased her usual level of anxiety.  Overall she has done quite well she is not having shortness of breath chest pain syncope no sustained arrhythmia strength and endurance are improved.  Unfortunately she is under increased stress in her home. Past Medical History:  Diagnosis Date  . Anxiety   . Atherosclerosis of coronary artery of native heart with angina pectoris (HCC)   . Atrial flutter (HCC)   . CAD (coronary artery disease)   .  COPD (chronic obstructive pulmonary disease) (HCC)   . Demand ischemia (HCC)   . Depression   . Diverticulosis   . Hyperlipidemia   . Hypertension   . Inappropriate ADH syndrome (HCC)   . Migraine    "none since ~ 2012; mild ones then when I did have them because of the beta blockers I was on" (06/09/2017)  . Myocardial infarction (HCC)    "I've had light ones" (06/09/2017)  . On  amiodarone therapy 04/13/2017  . Osteoarthritis   . Pneumonia    "couple times" (06/09/2017)    Past Surgical History:  Procedure Laterality Date  . ATRIAL FIBRILLATION ABLATION N/A 05/29/2017   Procedure: Atrial Fibrillation Ablation;  Surgeon: Regan Lemming, MD;  Location: [facility] INVASIVE CV LAB;  Service: Cardiovascular;  Laterality: N/A;  . BREAST SURGERY Left    "took a gland out; milk duct"  . CARDIAC CATHETERIZATION  2008   has had 2 procedures, the last one approx 2008, never had PCI/stent. Dr [name]  . CARPAL TUNNEL RELEASE Left   . DILATION AND CURETTAGE OF UTERUS     "related to heavy bleeding"  . INGUINAL HERNIA REPAIR Left   . SHOULDER ARTHROSCOPY WITH ROTATOR CUFF REPAIR Right   . TRACHEOSTOMY  2006   "closed on it's own"  . TUBAL LIGATION    . VAGINAL HYSTERECTOMY     "fibroids"    Current Medications: Current Meds  Medication Sig  . acetaminophen (TYLENOL) 500 MG tablet Take 1,000 mg by mouth 2 (two) times daily as needed for mild pain or headache.  . albuterol (PROVENTIL HFA;VENTOLIN HFA) 108 (90 Base) MCG/ACT inhaler Inhale 2 puffs into the lungs every 6 (six) hours as needed for wheezing or shortness of breath.  . budesonide-formoterol (SYMBICORT) 160-4.5 MCG/ACT inhaler Inhale 2 puffs into the lungs 2 (two) times daily.  Marland Kitchen escitalopram (LEXAPRO) 10 MG tablet Take 5 mg by mouth daily.  . fluticasone (FLONASE) 50 MCG/ACT nasal spray Place 2 sprays into both nostrils daily as needed for allergies.   . furosemide (LASIX) 20 MG tablet Take 1 tablet (20 mg total) by mouth 2 (two) times daily. Take only one daily if you weigh 124l bs or less  . LORazepam (ATIVAN) 1 MG tablet Take 1 mg by mouth every 8 (eight) hours as needed for anxiety.   . nitroGLYCERIN (NITROSTAT) 0.4 MG SL tablet Place 0.4 mg under the tongue every 5 (five) minutes as needed for chest pain.  . pantoprazole (PROTONIX) 40 MG tablet Take 40 mg by mouth daily.  . potassium chloride (K-DUR,KLOR-CON)  20 MEQ tablet Take 0.5 tablets (10 mEq total) by mouth 2 (two) times daily.  . pravastatin (PRAVACHOL) 20 MG tablet Take 1 tablet (20 mg total) by mouth every evening.  . rivaroxaban (XARELTO) 20 MG TABS tablet Take 1 tablet (20 mg total) daily by mouth.  . sacubitril-valsartan (ENTRESTO) 97-103 MG Take 1 tablet by mouth 2 (two) times daily.     Allergies:   Atorvastatin; Cefdinir; Levaquin [levofloxacin in d5w]; and Morphine and related   Social History   Socioeconomic History  . Marital status: Married    Spouse name: Not on file  . Number of children: Not on file  . Years of education: Not on file  . Highest education level: Not on file  Occupational History  . Not on file  Social Needs  . Financial resource strain: Not on file  . Food insecurity:    Worry: Not on file  Inability: Not on file  . Transportation needs:    Medical: Not on file    Non-medical: Not on file  Tobacco Use  . Smoking status: Current Every Day Smoker    Packs/day: 0.60    Years: 34.00    Pack years: 20.40    Types: Cigarettes, E-cigarettes  . Smokeless tobacco: Never Used  Substance and Sexual Activity  . Alcohol use: No  . Drug use: No  . Sexual activity: Not Currently  Lifestyle  . Physical activity:    Days per week: Not on file    Minutes per session: Not on file  . Stress: Not on file  Relationships  . Social connections:    Talks on phone: Not on file    Gets together: Not on file    Attends religious service: Not on file    Active member of club or organization: Not on file    Attends meetings of clubs or organizations: Not on file    Relationship status: Not on file  Other Topics Concern  . Not on file  Social History Narrative  . Not on file     Family History: The patient's family history includes CAD in her brother; Heart disease in her brother. ROS:   Please see the history of present illness.    All other systems reviewed and are negative.  EKGs/Labs/Other Studies  Reviewed:    The following studies were reviewed today:  EKG:  EKG ordered today.  The ekg ordered today demonstrates sinus rhythm left bundle branch block  Recent Labs: 04/20/2017: NT-Pro BNP 795 06/10/2017: Magnesium 2.1 06/13/2017: Hemoglobin 11.9; Platelets 401 08/06/2017: TSH 2.260 11/10/2017: ALT 19; BNP 107.6; BUN 12; Creatinine, Ser 0.60; Potassium 4.1; Sodium 130  Recent Lipid Panel    Component Value Date/Time   CHOL 217 (H) 11/10/2017 1620   TRIG 61 11/10/2017 1620   HDL 101 11/10/2017 1620   CHOLHDL 2.1 11/10/2017 1620   CHOLHDL 3.2 06/11/2017 1439   VLDL 20 06/11/2017 1439   LDLCALC 104 (H) 11/10/2017 1620    Physical Exam:    VS:  BP 114/74 (BP Location: Right Arm, Patient Position: Sitting, Cuff Size: Normal)   Pulse 70   Ht 5' (1.524 m)   Wt 131 lb 6.4 oz (59.6 kg)   SpO2 99%   BMI 25.66 kg/m     Wt Readings from Last 3 Encounters:  01/14/18 131 lb 6.4 oz (59.6 kg)  12/23/17 129 lb (58.5 kg)  11/10/17 132 lb (59.9 kg)     GEN:  Well nourished, well developed in no acute distress HEENT: Normal NECK: No JVD; No carotid bruits LYMPHATICS: No lymphadenopathy CARDIAC: RRR, no murmurs, rubs, gallops RESPIRATORY:  Clear to auscultation without rales, wheezing or rhonchi  ABDOMEN: Soft, non-tender, non-distended MUSCULOSKELETAL:  No edema; No deformity  SKIN: Warm and dry NEUROLOGIC:  Alert and oriented x 3 PSYCHIATRIC:  Normal affect    Signed, [name], MD  01/14/2018 11:57 AM    [facility]

## 2018-01-14 ENCOUNTER — Ambulatory Visit: Payer: PPO | Admitting: Cardiology

## 2018-01-14 ENCOUNTER — Encounter: Payer: Self-pay | Admitting: Cardiology

## 2018-01-14 VITALS — BP 114/74 | HR 70 | Ht 60.0 in | Wt 131.4 lb

## 2018-01-14 DIAGNOSIS — I5042 Chronic combined systolic (congestive) and diastolic (congestive) heart failure: Secondary | ICD-10-CM | POA: Diagnosis not present

## 2018-01-14 DIAGNOSIS — I480 Paroxysmal atrial fibrillation: Secondary | ICD-10-CM

## 2018-01-14 DIAGNOSIS — R002 Palpitations: Secondary | ICD-10-CM | POA: Diagnosis not present

## 2018-01-14 DIAGNOSIS — I110 Hypertensive heart disease with heart failure: Secondary | ICD-10-CM | POA: Diagnosis not present

## 2018-01-14 DIAGNOSIS — Z7901 Long term (current) use of anticoagulants: Secondary | ICD-10-CM

## 2018-01-14 NOTE — Patient Instructions (Addendum)
Medication Instructions:  Your physician recommends that you continue on your current medications as directed. Please refer to the Current Medication list given to you today.  Labwork: Your physician recommends that you have the following labs drawn: BMP, magnesium  Testing/Procedures: You had an EKG today.  Follow-Up: Your physician recommends that you schedule a follow-up appointment in: 3 months.  Any Other Special Instructions Will Be Listed Below (If Applicable).     If you need a refill on your cardiac medications before your next appointment, please call your pharmacy.    1. Avoid all over-the-counter antihistamines except Claritin/Loratadine and Zyrtec/Cetrizine. 2. Avoid all combination including cold sinus allergies flu decongestant and sleep medications 3. You can use Robitussin DM Mucinex and Mucinex DM for cough. 4. can use Tylenol aspirin ibuprofen and naproxen but no combinations such as sleep or sinus.    KardiaMobile [email or web address]        FDA-cleared, clinical grade mobile EKG monitor: Lourena Simmonds is the most clinically-validated mobile EKG used by the world's leading cardiac care medical professionals With Basic service, know instantly if your heart rhythm is normal or if atrial fibrillation is detected, and email the last single EKG recording to yourself or your doctor Premium service, available for purchase through the Kardia app for $9.99 per month or $99 per year, includes unlimited history and storage of your EKG recordings, a monthly EKG summary report to share with your doctor, along with the ability to track your blood pressure, activity and weight Includes one KardiaMobile phone clip FREE SHIPPING: Standard delivery 1-3 business days. Orders placed by 11:00am PST will ship that afternoon. Otherwise, will ship next business day. All orders ship via [employer] from [place], [place]

## 2018-01-15 LAB — BASIC METABOLIC PANEL
BUN / CREAT RATIO: 16 (ref 12–28)
BUN: 9 mg/dL (ref 8–27)
CHLORIDE: 98 mmol/L (ref 96–106)
CO2: 26 mmol/L (ref 20–29)
Calcium: 8.9 mg/dL (ref 8.7–10.3)
Creatinine, Ser: 0.57 mg/dL (ref 0.57–1.00)
GFR calc non Af Amer: 99 mL/min/{1.73_m2} (ref >59)
GFR, EST AFRICAN AMERICAN: 114 mL/min/{1.73_m2} (ref >59)
Glucose: 79 mg/dL (ref 65–99)
POTASSIUM: 4.7 mmol/L (ref 3.5–5.2)
Sodium: 138 mmol/L (ref 134–144)

## 2018-01-15 LAB — MAGNESIUM: Magnesium: 2.2 mg/dL (ref 1.6–2.3)

## 2018-02-12 DIAGNOSIS — Z1231 Encounter for screening mammogram for malignant neoplasm of breast: Secondary | ICD-10-CM | POA: Diagnosis not present

## 2018-02-23 DIAGNOSIS — N6012 Diffuse cystic mastopathy of left breast: Secondary | ICD-10-CM | POA: Diagnosis not present

## 2018-02-23 DIAGNOSIS — Z1231 Encounter for screening mammogram for malignant neoplasm of breast: Secondary | ICD-10-CM | POA: Diagnosis not present

## 2018-02-23 DIAGNOSIS — N6011 Diffuse cystic mastopathy of right breast: Secondary | ICD-10-CM | POA: Diagnosis not present

## 2018-02-25 ENCOUNTER — Other Ambulatory Visit: Payer: Self-pay

## 2018-02-25 DIAGNOSIS — I5042 Chronic combined systolic (congestive) and diastolic (congestive) heart failure: Secondary | ICD-10-CM

## 2018-02-25 MED ORDER — SACUBITRIL-VALSARTAN 97-103 MG PO TABS: 1.0000 | ORAL_TABLET | Freq: Two times a day (BID) | ORAL | 3 refills | Status: DC

## 2018-02-26 ENCOUNTER — Other Ambulatory Visit: Payer: Self-pay | Admitting: Cardiology

## 2018-02-26 DIAGNOSIS — I5043 Acute on chronic combined systolic (congestive) and diastolic (congestive) heart failure: Secondary | ICD-10-CM

## 2018-02-27 DIAGNOSIS — H5711 Ocular pain, right eye: Secondary | ICD-10-CM | POA: Diagnosis not present

## 2018-03-02 DIAGNOSIS — I110 Hypertensive heart disease with heart failure: Secondary | ICD-10-CM | POA: Diagnosis not present

## 2018-03-02 DIAGNOSIS — I4891 Unspecified atrial fibrillation: Secondary | ICD-10-CM | POA: Diagnosis not present

## 2018-03-02 DIAGNOSIS — L03213 Periorbital cellulitis: Secondary | ICD-10-CM | POA: Diagnosis not present

## 2018-03-02 DIAGNOSIS — F419 Anxiety disorder, unspecified: Secondary | ICD-10-CM | POA: Diagnosis not present

## 2018-03-02 DIAGNOSIS — Z7902 Long term (current) use of antithrombotics/antiplatelets: Secondary | ICD-10-CM | POA: Diagnosis not present

## 2018-03-02 DIAGNOSIS — J449 Chronic obstructive pulmonary disease, unspecified: Secondary | ICD-10-CM | POA: Diagnosis not present

## 2018-03-02 DIAGNOSIS — E785 Hyperlipidemia, unspecified: Secondary | ICD-10-CM | POA: Diagnosis not present

## 2018-03-02 DIAGNOSIS — Z716 Tobacco abuse counseling: Secondary | ICD-10-CM | POA: Diagnosis not present

## 2018-03-02 DIAGNOSIS — H05011 Cellulitis of right orbit: Secondary | ICD-10-CM | POA: Diagnosis not present

## 2018-03-02 DIAGNOSIS — H04321 Acute dacryocystitis of right lacrimal passage: Secondary | ICD-10-CM | POA: Diagnosis not present

## 2018-03-02 DIAGNOSIS — F418 Other specified anxiety disorders: Secondary | ICD-10-CM | POA: Diagnosis not present

## 2018-03-02 DIAGNOSIS — Z7901 Long term (current) use of anticoagulants: Secondary | ICD-10-CM | POA: Diagnosis not present

## 2018-03-02 DIAGNOSIS — H04301 Unspecified dacryocystitis of right lacrimal passage: Secondary | ICD-10-CM | POA: Diagnosis not present

## 2018-03-02 DIAGNOSIS — F17210 Nicotine dependence, cigarettes, uncomplicated: Secondary | ICD-10-CM | POA: Diagnosis not present

## 2018-03-02 DIAGNOSIS — I509 Heart failure, unspecified: Secondary | ICD-10-CM | POA: Diagnosis not present

## 2018-03-02 DIAGNOSIS — I10 Essential (primary) hypertension: Secondary | ICD-10-CM | POA: Diagnosis not present

## 2018-03-02 DIAGNOSIS — Z79899 Other long term (current) drug therapy: Secondary | ICD-10-CM | POA: Diagnosis not present

## 2018-03-02 DIAGNOSIS — R5381 Other malaise: Secondary | ICD-10-CM | POA: Diagnosis not present

## 2018-03-02 DIAGNOSIS — Z720 Tobacco use: Secondary | ICD-10-CM | POA: Diagnosis not present

## 2018-03-03 DIAGNOSIS — I10 Essential (primary) hypertension: Secondary | ICD-10-CM | POA: Diagnosis not present

## 2018-03-03 DIAGNOSIS — F418 Other specified anxiety disorders: Secondary | ICD-10-CM | POA: Diagnosis not present

## 2018-03-03 DIAGNOSIS — E785 Hyperlipidemia, unspecified: Secondary | ICD-10-CM | POA: Diagnosis not present

## 2018-03-03 DIAGNOSIS — I509 Heart failure, unspecified: Secondary | ICD-10-CM | POA: Diagnosis not present

## 2018-03-03 DIAGNOSIS — Z720 Tobacco use: Secondary | ICD-10-CM | POA: Diagnosis not present

## 2018-03-03 DIAGNOSIS — I4891 Unspecified atrial fibrillation: Secondary | ICD-10-CM | POA: Diagnosis not present

## 2018-03-04 DIAGNOSIS — H04301 Unspecified dacryocystitis of right lacrimal passage: Secondary | ICD-10-CM | POA: Diagnosis not present

## 2018-03-05 ENCOUNTER — Other Ambulatory Visit: Payer: Self-pay

## 2018-03-05 NOTE — Patient Outreach (Signed)
[facility]) Care Management  03/05/2018  Jennifer Martin September 25, 1956 [id number]   RNCM received notification that client was discharged from [facility] on 03/04/18. Client's primary care is listed as completing their own transition of care call.  Transition of care will be completed by primary care provider office who will refer to [facility] care management if needed.  Plan: RNCM will not open case.  [name], RN, MSN, [facility] Community Care Coordinator Cell: [phone]

## 2018-03-06 ENCOUNTER — Other Ambulatory Visit: Payer: Self-pay | Admitting: Cardiology

## 2018-03-06 DIAGNOSIS — I5042 Chronic combined systolic (congestive) and diastolic (congestive) heart failure: Secondary | ICD-10-CM

## 2018-03-10 DIAGNOSIS — I5022 Chronic systolic (congestive) heart failure: Secondary | ICD-10-CM | POA: Diagnosis not present

## 2018-03-10 DIAGNOSIS — J449 Chronic obstructive pulmonary disease, unspecified: Secondary | ICD-10-CM | POA: Diagnosis not present

## 2018-03-10 DIAGNOSIS — I482 Chronic atrial fibrillation: Secondary | ICD-10-CM | POA: Diagnosis not present

## 2018-03-10 DIAGNOSIS — H05011 Cellulitis of right orbit: Secondary | ICD-10-CM | POA: Diagnosis not present

## 2018-03-10 DIAGNOSIS — Z79899 Other long term (current) drug therapy: Secondary | ICD-10-CM | POA: Diagnosis not present

## 2018-03-18 ENCOUNTER — Telehealth: Payer: Self-pay | Admitting: Cardiology

## 2018-03-18 NOTE — Telephone Encounter (Signed)
Patient advised okay to hold Xarelto. Advised to ask the surgeon when to resume. Patient verbalized understanding, no further questions.

## 2018-03-18 NOTE — Telephone Encounter (Signed)
OK to stop xarelto for surgery. Be sure she asks the surgeon when to resume

## 2018-03-18 NOTE — Telephone Encounter (Signed)
Spoke with patient regarding upcoming eye surgery that is planned for June 4th with Dr Harvel Quale.  Dr Harvel Quale would like her to hold Xarelto starting 5-31 until 6-13.  She will be having dacryocystorhinostomy, anterior ephmoidectomy, and lacrimal sac biopsy due to recent diagnosis of orbital cellulitis.  Please advise

## 2018-03-18 NOTE — Telephone Encounter (Signed)
Spoke with patients husband Kathlene November and left message for patient to return call.

## 2018-03-18 NOTE — Telephone Encounter (Signed)
Is having an eye procedure 04/01/18 and is needing to stop her lasix 13 days prior

## 2018-03-31 ENCOUNTER — Other Ambulatory Visit: Payer: Self-pay

## 2018-03-31 ENCOUNTER — Encounter (HOSPITAL_COMMUNITY): Payer: Self-pay | Admitting: *Deleted

## 2018-03-31 NOTE — Progress Notes (Signed)
Pt denies any acute cardiopulmonary issues. Pt under the care of Dr. [name], Cardiology. Pt stated that last dose of Xarelto was 03/19/18 as instructed. Pt denies recent labs. Pt made aware to stop taking vitamins, fish oil and herbal medications. Do not take any NSAIDs ie: Ibuprofen, Advil, Naproxen (Aleve), Motrin, BC and Goody Powder. Pt stated that she has not been taking her Lovaza ( fish oil). Pt verbalized understanding of all pre-op instructions. Anesthesia asked to review pt history.

## 2018-03-31 NOTE — Progress Notes (Signed)
Anesthesia Chart Review:  Pt is a same day work up     Case:  [id number] Date/Time:  04/01/18 0715   Procedures:      LACRIMAL DUCT EXPLORATION AND ETHMOIDECTOMY (Right )     TEAR DUCT PROBING WITH STENT (Right )   Anesthesia type:  General   Pre-op diagnosis:  DACRYOCYSTITIDS OF RIGHT LACRIMAL SAC   [facility]  [facility] ROOM 08 / [facility] OR   Surgeon:  Floydene Flock, MD      DISCUSSION: - Pt is a 62 year old female with hx atrial fibrillation (s/p ablation 05/29/17), chronic combined systolic and diastolic HF, LBBB, HTN, COPD  - Last dose xarelto 03/19/18  - Anesthesia history: pt reports difficult intubation 13 years ago that required emergency trach.  No longer has trach.  Review of anesthesia records in Epic 05/29/17: 6.5 ETT placed on one attempt using stylet (blind intubation. "Recommend- induction with short-acting agent, and alternative techniques readily available. Comments: Patient with history of traumatic intubation resulting in emergency tracheostomy. Elective glidescope and use planned and in room on standby, Dr. Jacklynn Bue successfully performed a blind intubation and glidescope not used."     PROVIDERS: PCP is [name]., MD   Patient Care Team: Regan Lemming, MD as Consulting Physician (Cardiology) (EP) Cardiologist is [name], MD who is aware of upcoming surgery   LABS: Will be obtained day of surgery   IMAGES:  1 view CXR 06/10/17: Cardiomegaly without acute disease.   EKG 01/14/18: NSR. LBBB.    CV:  Echo 12/15/17:  - Left ventricle: The cavity size was normal. Wall thickness was increased in a pattern of mild LVH. Systolic function was normal. Wall motion was normal; there were no regional wall motion abnormalities. Doppler parameters are consistent with abnormal left ventricular relaxation (grade 1 diastolic dysfunction). - Aortic valve: There was mild regurgitation. Valve area (VTI): 3.03 cm^2. Valve area (Vmax): 2.51 cm^2. Valve area (Vmean): 2.51  cm^2. - Mitral valve: There was mild regurgitation. - Left atrium: The atrium was moderately dilated.  CT cardiac morphology 05/27/17:  1. There is normal pulmonary vein drainage into the left atrium. No evidence for pulmonary vein stenosis. 2. The left atrial appendage is large with chicken wing morphology and 1 major lobe. Ostial size 25 x 24 mm and length 45 mm. There is no thrombus in the left atrial appendage. 3. The esophagus runs to the left from the left atrial midline and is in the proximity to the left pulmonary vein. 4. Calcium score 161, that represents 90 percentile age/sex. Normal coronary origin. Image quality insufficient for CAD evaluation.  Nuclear stress test 01/26/17:  1. No ischemia seen on this scan 2. Normal gated images 3. EF 61%   Past Medical History:  Diagnosis Date  . Anxiety   . Atherosclerosis of coronary artery of native heart with angina pectoris (HCC)   . Atrial flutter (HCC)   . CAD (coronary artery disease)   . CHF (congestive heart failure) (HCC)   . COPD (chronic obstructive pulmonary disease) (HCC)   . Demand ischemia (HCC)   . Depression   . Difficult intubation    " small airway and need a little tube "  . Diverticulosis   . GERD (gastroesophageal reflux disease)   . Hyperlipidemia   . Hypertension   . Inappropriate ADH syndrome (HCC)   . LBBB (left bundle branch block)   . Migraine    "none since ~ 2012; mild ones then  when I did have them because of the beta blockers I was on" (06/09/2017)  . Myocardial infarction (HCC)    "I've had light ones" (06/09/2017)  . On amiodarone therapy 04/13/2017  . Osteoarthritis   . Pneumonia    "couple times" (06/09/2017)    Past Surgical History:  Procedure Laterality Date  . ATRIAL FIBRILLATION ABLATION N/A 05/29/2017   Procedure: Atrial Fibrillation Ablation;  Surgeon: Regan Lemming, MD;  Location: [facility] INVASIVE CV LAB;  Service: Cardiovascular;  Laterality: N/A;  . BREAST SURGERY Left    "took  a gland out; milk duct"  . CARDIAC CATHETERIZATION  2008   has had 2 procedures, the last one approx 2008, never had PCI/stent. Dr [name]  . CARPAL TUNNEL RELEASE Left   . DILATION AND CURETTAGE OF UTERUS     "related to heavy bleeding"  . INGUINAL HERNIA REPAIR Left   . SHOULDER ARTHROSCOPY WITH ROTATOR CUFF REPAIR Right   . TRACHEOSTOMY  2006   "closed on it's own"  . TUBAL LIGATION    . VAGINAL HYSTERECTOMY     "fibroids"    MEDICATIONS: No current facility-administered medications for this encounter.    Marland Kitchen acetaminophen (TYLENOL) 500 MG tablet  . amoxicillin (AMOXIL) 500 MG capsule  . budesonide-formoterol (SYMBICORT) 160-4.5 MCG/ACT inhaler  . Calcium Carbonate-Vitamin D (CALCIUM 600+D PO)  . escitalopram (LEXAPRO) 10 MG tablet  . fluticasone (FLONASE) 50 MCG/ACT nasal spray  . furosemide (LASIX) 20 MG tablet  . LORazepam (ATIVAN) 1 MG tablet  . omega-3 acid ethyl esters (LOVAZA) 1 g capsule  . pantoprazole (PROTONIX) 40 MG tablet  . potassium chloride SA (K-DUR,KLOR-CON) 20 MEQ tablet  . pravastatin (PRAVACHOL) 20 MG tablet  . predniSONE (DELTASONE) 10 MG tablet  . sacubitril-valsartan (ENTRESTO) 97-103 MG  . sulfamethoxazole-trimethoprim (BACTRIM DS,SEPTRA DS) 800-160 MG tablet  . traMADol (ULTRAM) 50 MG tablet  . albuterol (PROVENTIL HFA;VENTOLIN HFA) 108 (90 Base) MCG/ACT inhaler  . ENTRESTO 97-103 MG  . nitroGLYCERIN (NITROSTAT) 0.4 MG SL tablet  . rivaroxaban (XARELTO) 20 MG TABS tablet   - Pt stopped xarelto 03/19/18.    If labs acceptable day of surgery, I anticipate pt can proceed with surgery as scheduled.  Rica Mast, FNP-BC [facility] Short Stay Surgical [facility]/Anesthesiology Phone: [phone] 03/31/2018 2:28 PM

## 2018-04-01 ENCOUNTER — Encounter (HOSPITAL_COMMUNITY): Payer: Self-pay

## 2018-04-01 ENCOUNTER — Ambulatory Visit (HOSPITAL_COMMUNITY): Payer: PPO | Admitting: Emergency Medicine

## 2018-04-01 ENCOUNTER — Encounter (HOSPITAL_COMMUNITY)
Admission: RE | Disposition: A | Discharge status: Home / Self Care | Payer: Self-pay | Source: Ambulatory Visit | Attending: Oculoplastics Ophthalmology

## 2018-04-01 ENCOUNTER — Ambulatory Visit (HOSPITAL_COMMUNITY)
Admission: RE | Admit: 2018-04-01 | Discharge: 2018-04-01 | Disposition: A | Discharge status: Home / Self Care | Payer: PPO | Source: Ambulatory Visit | Attending: Oculoplastics Ophthalmology | Admitting: Oculoplastics Ophthalmology

## 2018-04-01 DIAGNOSIS — K219 Gastro-esophageal reflux disease without esophagitis: Secondary | ICD-10-CM | POA: Diagnosis not present

## 2018-04-01 DIAGNOSIS — M1990 Unspecified osteoarthritis, unspecified site: Secondary | ICD-10-CM | POA: Diagnosis not present

## 2018-04-01 DIAGNOSIS — J449 Chronic obstructive pulmonary disease, unspecified: Secondary | ICD-10-CM | POA: Diagnosis not present

## 2018-04-01 DIAGNOSIS — I447 Left bundle-branch block, unspecified: Secondary | ICD-10-CM | POA: Insufficient documentation

## 2018-04-01 DIAGNOSIS — F419 Anxiety disorder, unspecified: Secondary | ICD-10-CM | POA: Diagnosis not present

## 2018-04-01 DIAGNOSIS — I2510 Atherosclerotic heart disease of native coronary artery without angina pectoris: Secondary | ICD-10-CM | POA: Insufficient documentation

## 2018-04-01 DIAGNOSIS — F329 Major depressive disorder, single episode, unspecified: Secondary | ICD-10-CM | POA: Diagnosis not present

## 2018-04-01 DIAGNOSIS — I5042 Chronic combined systolic (congestive) and diastolic (congestive) heart failure: Secondary | ICD-10-CM | POA: Diagnosis not present

## 2018-04-01 DIAGNOSIS — E785 Hyperlipidemia, unspecified: Secondary | ICD-10-CM | POA: Insufficient documentation

## 2018-04-01 DIAGNOSIS — Z888 Allergy status to other drugs, medicaments and biological substances status: Secondary | ICD-10-CM | POA: Diagnosis not present

## 2018-04-01 DIAGNOSIS — H04411 Chronic dacryocystitis of right lacrimal passage: Secondary | ICD-10-CM | POA: Diagnosis not present

## 2018-04-01 DIAGNOSIS — H04551 Acquired stenosis of right nasolacrimal duct: Secondary | ICD-10-CM | POA: Insufficient documentation

## 2018-04-01 DIAGNOSIS — Z881 Allergy status to other antibiotic agents status: Secondary | ICD-10-CM | POA: Diagnosis not present

## 2018-04-01 DIAGNOSIS — I110 Hypertensive heart disease with heart failure: Secondary | ICD-10-CM | POA: Insufficient documentation

## 2018-04-01 DIAGNOSIS — Z79899 Other long term (current) drug therapy: Secondary | ICD-10-CM | POA: Insufficient documentation

## 2018-04-01 DIAGNOSIS — H04301 Unspecified dacryocystitis of right lacrimal passage: Secondary | ICD-10-CM | POA: Diagnosis not present

## 2018-04-01 DIAGNOSIS — I252 Old myocardial infarction: Secondary | ICD-10-CM | POA: Diagnosis not present

## 2018-04-01 DIAGNOSIS — Z7901 Long term (current) use of anticoagulants: Secondary | ICD-10-CM | POA: Insufficient documentation

## 2018-04-01 DIAGNOSIS — Z885 Allergy status to narcotic agent status: Secondary | ICD-10-CM | POA: Diagnosis not present

## 2018-04-01 HISTORY — DX: Failed or difficult intubation, initial encounter: T88.4XXA

## 2018-04-01 HISTORY — DX: Heart failure, unspecified: I50.9

## 2018-04-01 HISTORY — PX: TEAR DUCT PROBING: SHX793

## 2018-04-01 HISTORY — DX: Left bundle-branch block, unspecified: I44.7

## 2018-04-01 HISTORY — PX: LACRIMAL DUCT EXPLORATION: SHX6569

## 2018-04-01 HISTORY — DX: Gastro-esophageal reflux disease without esophagitis: K21.9

## 2018-04-01 LAB — CBC
HCT: 34.6 % — ABNORMAL LOW (ref 36.0–46.0)
Hemoglobin: 11.6 g/dL — ABNORMAL LOW (ref 12.0–15.0)
MCH: 30.9 pg (ref 26.0–34.0)
MCHC: 33.5 g/dL (ref 30.0–36.0)
MCV: 92.3 fL (ref 78.0–100.0)
PLATELETS: 268 10*3/uL (ref 150–400)
RBC: 3.75 MIL/uL — ABNORMAL LOW (ref 3.87–5.11)
RDW: 13.2 % (ref 11.5–15.5)
WBC: 6.2 10*3/uL (ref 4.0–10.5)

## 2018-04-01 LAB — BASIC METABOLIC PANEL
Anion gap: 11 (ref 5–15)
BUN: 10 mg/dL (ref 6–20)
CALCIUM: 8.7 mg/dL — AB (ref 8.9–10.3)
CO2: 25 mmol/L (ref 22–32)
CREATININE: 0.61 mg/dL (ref 0.44–1.00)
Chloride: 101 mmol/L (ref 101–111)
GFR calc Af Amer: >60 mL/min (ref >60)
Glucose, Bld: 106 mg/dL — ABNORMAL HIGH (ref 65–99)
Potassium: 3.6 mmol/L (ref 3.5–5.1)
Sodium: 137 mmol/L (ref 135–145)

## 2018-04-01 LAB — PROTIME-INR
INR: 0.94
Prothrombin Time: 12.5 seconds (ref 11.4–15.2)

## 2018-04-01 SURGERY — EXPLORATION, LACRIMAL DUCT
Anesthesia: General | Site: Eye | Laterality: Right

## 2018-04-01 MED ORDER — LIDOCAINE-EPINEPHRINE 1 %-1:100000 IJ SOLN
INTRAMUSCULAR | Status: AC
  Filled 2018-04-01: qty 1

## 2018-04-01 MED ORDER — PREDNISONE 10 MG PO TABS: 20.0000 mg | ORAL_TABLET | Freq: Every day | ORAL | 0 refills | Status: AC

## 2018-04-01 MED ORDER — TOBRAMYCIN-DEXAMETHASONE 0.3-0.1 % OP OINT
TOPICAL_OINTMENT | OPHTHALMIC | Status: AC
  Filled 2018-04-01: qty 3.5

## 2018-04-01 MED ORDER — LIDOCAINE HCL (CARDIAC) PF 100 MG/5ML IV SOSY
PREFILLED_SYRINGE | INTRAVENOUS | Status: DC | PRN
  Administered 2018-04-01: 40 mg via INTRAVENOUS

## 2018-04-01 MED ORDER — BUPIVACAINE HCL (PF) 0.5 % IJ SOLN
INTRAMUSCULAR | Status: AC
  Filled 2018-04-01: qty 30

## 2018-04-01 MED ORDER — OXYMETAZOLINE HCL 0.05 % NA SOLN
NASAL | Status: AC
  Filled 2018-04-01: qty 15

## 2018-04-01 MED ORDER — SUCCINYLCHOLINE CHLORIDE 20 MG/ML IJ SOLN
INTRAMUSCULAR | Status: DC | PRN
  Administered 2018-04-01: 100 mg via INTRAVENOUS

## 2018-04-01 MED ORDER — FENTANYL CITRATE (PF) 100 MCG/2ML IJ SOLN
INTRAMUSCULAR | Status: DC | PRN
  Administered 2018-04-01 (×2): 25 ug via INTRAVENOUS
  Administered 2018-04-01: 50 ug via INTRAVENOUS

## 2018-04-01 MED ORDER — SUGAMMADEX SODIUM 200 MG/2ML IV SOLN
INTRAVENOUS | Status: DC | PRN
  Administered 2018-04-01: 200 mg via INTRAVENOUS

## 2018-04-01 MED ORDER — PROPOFOL 10 MG/ML IV BOLUS
INTRAVENOUS | Status: DC | PRN
  Administered 2018-04-01: 130 mg via INTRAVENOUS
  Administered 2018-04-01: 30 mg via INTRAVENOUS
  Administered 2018-04-01: 40 mg via INTRAVENOUS

## 2018-04-01 MED ORDER — HYDROCODONE-ACETAMINOPHEN 5-325 MG PO TABS
ORAL_TABLET | ORAL | Status: AC
  Filled 2018-04-01: qty 2

## 2018-04-01 MED ORDER — HEMOSTATIC AGENTS (NO CHARGE) OPTIME
TOPICAL | Status: DC | PRN
  Administered 2018-04-01: 1 via TOPICAL

## 2018-04-01 MED ORDER — DEXAMETHASONE SODIUM PHOSPHATE 10 MG/ML IJ SOLN
INTRAMUSCULAR | Status: DC | PRN
  Administered 2018-04-01: 5 mg via INTRAVENOUS

## 2018-04-01 MED ORDER — BSS IO SOLN
INTRAOCULAR | Status: AC
  Filled 2018-04-01: qty 15

## 2018-04-01 MED ORDER — PROMETHAZINE HCL 6.25 MG/5ML PO SYRP
25.0000 mg | ORAL_SOLUTION | Freq: Every day | ORAL | 1 refills | Status: DC
Start: 2018-04-01 — End: 2018-04-19

## 2018-04-01 MED ORDER — MIDAZOLAM HCL 2 MG/2ML IJ SOLN
INTRAMUSCULAR | Status: AC
  Filled 2018-04-01: qty 2

## 2018-04-01 MED ORDER — OXYMETAZOLINE HCL 0.05 % NA SOLN
2.0000 | Freq: Two times a day (BID) | NASAL | Status: DC
  Filled 2018-04-01: qty 15

## 2018-04-01 MED ORDER — HYDROCODONE-ACETAMINOPHEN 5-325 MG PO TABS: 2.0000 | ORAL_TABLET | Freq: Four times a day (QID) | ORAL | 0 refills | Status: DC | PRN

## 2018-04-01 MED ORDER — HYDROCODONE-ACETAMINOPHEN 5-325 MG PO TABS
2.0000 | ORAL_TABLET | Freq: Once | ORAL | Status: AC
  Administered 2018-04-01: 2 via ORAL

## 2018-04-01 MED ORDER — MIDAZOLAM HCL 5 MG/5ML IJ SOLN
INTRAMUSCULAR | Status: DC | PRN
  Administered 2018-04-01 (×2): 1 mg via INTRAVENOUS

## 2018-04-01 MED ORDER — EPINEPHRINE HCL (NASAL) 0.1 % NA SOLN
NASAL | Status: AC
  Filled 2018-04-01: qty 30

## 2018-04-01 MED ORDER — ROCURONIUM BROMIDE 100 MG/10ML IV SOLN
INTRAVENOUS | Status: DC | PRN
  Administered 2018-04-01: 10 mg via INTRAVENOUS
  Administered 2018-04-01: 40 mg via INTRAVENOUS

## 2018-04-01 MED ORDER — ONDANSETRON HCL 4 MG/2ML IJ SOLN
INTRAMUSCULAR | Status: DC | PRN
  Administered 2018-04-01: 4 mg via INTRAVENOUS

## 2018-04-01 MED ORDER — TETRACAINE HCL 0.5 % OP SOLN
OPHTHALMIC | Status: AC
  Filled 2018-04-01: qty 4

## 2018-04-01 MED ORDER — LACTATED RINGERS IV SOLN
INTRAVENOUS | Status: DC | PRN
  Administered 2018-04-01: 07:00:00 via INTRAVENOUS

## 2018-04-01 MED ORDER — FENTANYL CITRATE (PF) 100 MCG/2ML IJ SOLN
INTRAMUSCULAR | Status: AC
  Filled 2018-04-01: qty 2

## 2018-04-01 MED ORDER — FENTANYL CITRATE (PF) 250 MCG/5ML IJ SOLN
INTRAMUSCULAR | Status: AC
  Filled 2018-04-01: qty 5

## 2018-04-01 MED ORDER — OXYMETAZOLINE HCL 0.05 % NA SOLN
NASAL | Status: DC | PRN
  Administered 2018-04-01: 1

## 2018-04-01 MED ORDER — PROPOFOL 10 MG/ML IV BOLUS
INTRAVENOUS | Status: AC
  Filled 2018-04-01: qty 40

## 2018-04-01 MED ORDER — LIDOCAINE HCL 1 % IJ SOLN
INTRAMUSCULAR | Status: DC | PRN
  Administered 2018-04-01: 8 mL

## 2018-04-01 MED ORDER — PROMETHAZINE HCL 25 MG/ML IJ SOLN: 6.2500 mg | INTRAMUSCULAR | Status: DC | PRN

## 2018-04-01 MED ORDER — FENTANYL CITRATE (PF) 100 MCG/2ML IJ SOLN
25.0000 ug | INTRAMUSCULAR | Status: DC | PRN
  Administered 2018-04-01 (×2): 25 ug via INTRAVENOUS
  Administered 2018-04-01: 50 ug via INTRAVENOUS

## 2018-04-01 SURGICAL SUPPLY — 43 items
APPLICATOR DR MATTHEWS STRL (MISCELLANEOUS) ×3 IMPLANT
BLADE EYE CATARACT 19 1.4 BEAV (BLADE) ×3 IMPLANT
BLADE SURG 15 STRL LF DISP TIS (BLADE) ×1 IMPLANT
BLADE SURG 15 STRL SS (BLADE) ×2
CLEANER TIP ELECTROSURG 2X2 (MISCELLANEOUS) ×3 IMPLANT
CLOSURE STERI-STRIP 1/2X4 (GAUZE/BANDAGES/DRESSINGS) ×1
CLOSURE WOUND 1/2 X4 (GAUZE/BANDAGES/DRESSINGS) ×1
CLSR STERI-STRIP ANTIMIC 1/2X4 (GAUZE/BANDAGES/DRESSINGS) ×2 IMPLANT
COAGULATOR SUCT 6 FR SWTCH (ELECTROSURGICAL) ×1
COAGULATOR SUCT 8FR VV (MISCELLANEOUS) ×3 IMPLANT
COAGULATOR SUCT SWTCH 10FR 6 (ELECTROSURGICAL) ×2 IMPLANT
CORD BI POLAR (MISCELLANEOUS) ×3 IMPLANT
CORD BIPOLAR FORCEPS 12FT (ELECTRODE) ×3 IMPLANT
COVER SURGICAL LIGHT HANDLE (MISCELLANEOUS) ×3 IMPLANT
DRAPE ORTHO SPLIT 77X108 STRL (DRAPES) ×2
DRAPE ORTHO SPLIT 87X125 STRL (DRAPES) ×3 IMPLANT
DRAPE SURG ORHT 6 SPLT 77X108 (DRAPES) ×1 IMPLANT
FLOSEAL 5ML (HEMOSTASIS) ×3 IMPLANT
GLOVE BIO SURGEON STRL SZ7.5 (GLOVE) ×6 IMPLANT
GOWN STRL REUS W/ TWL LRG LVL3 (GOWN DISPOSABLE) ×2 IMPLANT
GOWN STRL REUS W/TWL LRG LVL3 (GOWN DISPOSABLE) ×4
KIT BASIN OR (CUSTOM PROCEDURE TRAY) ×3 IMPLANT
NEEDLE HYPO 30X.5 LL (NEEDLE) IMPLANT
NS IRRIG 1000ML POUR BTL (IV SOLUTION) ×3 IMPLANT
PACK CATARACT CUSTOM (CUSTOM PROCEDURE TRAY) ×3 IMPLANT
PAD ARMBOARD 7.5X6 YLW CONV (MISCELLANEOUS) ×6 IMPLANT
PATTIES SURGICAL 1/4 X 3 (GAUZE/BANDAGES/DRESSINGS) ×3 IMPLANT
PENCIL BUTTON HOLSTER BLD 10FT (ELECTRODE) ×3 IMPLANT
SET INTBT LACRIMAL .016X.025 (MISCELLANEOUS) ×3 IMPLANT
SPOGE SURGIFLO 8M (HEMOSTASIS)
SPONGE SURGIFLO 8M (HEMOSTASIS) IMPLANT
STRIP CLOSURE SKIN 1/2X4 (GAUZE/BANDAGES/DRESSINGS) ×2 IMPLANT
SURGIFLO W/THROMBIN 8M KIT (HEMOSTASIS) ×3 IMPLANT
SUT ETHILON 6 0 9-3 1X18 BLK (SUTURE) ×3 IMPLANT
SUT ETHILON 6 0 P 1 (SUTURE) ×3 IMPLANT
SUT ETHILON 7 0 P 1 (SUTURE) IMPLANT
SUT PLAIN 5 0 P 3 18 (SUTURE) IMPLANT
SUT SILK 4 0 P 3 (SUTURE) ×3 IMPLANT
SUT SILK 6 0 BV 1XDISCX (SUTURE) IMPLANT
TOWEL OR 17X24 6PK STRL BLUE (TOWEL DISPOSABLE) ×6 IMPLANT
TUBE CONNECTING 12'X1/4 (SUCTIONS) ×1
TUBE CONNECTING 12X1/4 (SUCTIONS) ×2 IMPLANT
WATER STERILE IRR 1000ML POUR (IV SOLUTION) ×3 IMPLANT

## 2018-04-01 NOTE — Op Note (Signed)
Procedure(s): LACRIMAL DUCT EXPLORATION AND ETHMOIDECTOMY TEAR DUCT PROBING WITH STENT Procedure Note  Jennifer Martin female 62 y.o. 04/01/2018  Procedure(s) and Anesthesia Type:    * LACRIMAL DUCT EXPLORATION AND ETHMOIDECTOMY - General    * TEAR DUCT PROBING WITH STENT - General  Surgeon(s) and Role:    * Abugo, Levester Fresh, MD - Primary  LACRIMAL DUCT EXPLORATION AND ETHMOIDECTOMY, TEAR DUCT PROBING WITH STENT   Indications: The patient was admitted to the hospital with a brief history of right-sided nasolacrimal obstruction.  An intitial probing revealed chronic dacryocystitis. The patient now presents for dacryocystorhinostomy after discussing therapeutic alternatives.        Surgeon: Floydene Flock   Assistants: none Anesthesia: General endotracheal anesthesia and Local anesthesia 1% buffered lidocaine, with epinephrine, .75 bupivicaine  ASA Class: 3  Procedure Detail  Patient was brought to the room in supine position where the appropriate monitoring was put in place. Then the patient was prepped and draped in the usual standard fashion of plastic surgery. The right nasal cavity was packed with cottonoid's soaked in afrin.  Attention was turned to the area between the medial canthus and the nasal bridge which has been previously marked. A 15 blade was used to incise the area. Then blunt dissection was taken down to the level of the anterior lacrimal crest. Then the periosteal elevator was used to bluntly remove the periosteum and the lacrimal sac off of the lacrimal crest. The elevator was used to infracture below the lacrimal crest to create an opening into the nasal cavity. Then a kerrison rongeur was used to remove the bone approximately 76mm to create a window into the nasal cavity. The nasal mucosa was infiltrated with local block. Then an anterior ethmoidectomy took place. Antention was turned to the puncta where a 1 bowman probe was used to ensure the appropriate  position of the nasolacrimal intubation tubes. A beaver blade was used to create an opening into the nasal cavity through the nasal mucosa. Crawford tubes were then inserted through the upper and lower canalicular system and retrieved through the nasal cavity with a curved forcep. This was sutured to the lateral nasal mucosa with a 4-0 silk suture.  Hemostasis was maintained with monopolar and bipolar cautery.  Surgiflow was placed into the defect and then the incision was closed with 5-0 undyed vicryl and then the skin was closed in an interrupted fashion with 6-0 nylon. Tobradex ointment was placed over the wound and it was covered with a steri-strip. Then the right nose was covered with an eyepad.  The patient tolerated the procedure well and was transferred to the PACU In stable condition.   Findings: Solid substance in the sac  Estimated Blood Loss:  less than 50 mL         Drains: Crawford Stent Right Side         Total IV Fluids: <2L  Blood Given: none          Specimens: Nasolacrimal Duct Mass          Implants: none        Complications:  * No complications entered in OR log *         Disposition: PACU - hemodynamically stable.         Condition: stable

## 2018-04-01 NOTE — Transfer of Care (Signed)
Immediate Anesthesia Transfer of Care Note  Patient: Jennifer Martin  Procedure(s) Performed: LACRIMAL DUCT EXPLORATION AND ETHMOIDECTOMY (Right Eye) TEAR DUCT PROBING WITH STENT (Right Eye)  Patient Location: PACU  Anesthesia Type:General  Level of Consciousness: awake, alert , oriented and sedated  Airway & Oxygen Therapy: Patient Spontanous Breathing and Patient connected to face mask oxygen  Post-op Assessment: Report given to RN, Post -op Vital signs reviewed and stable and Patient moving all extremities  Post vital signs: Reviewed and stable  Last Vitals:  Vitals Value Taken Time  BP 134/72 04/01/2018  9:11 AM  Temp    Pulse 81 04/01/2018  9:13 AM  Resp 19 04/01/2018  9:12 AM  SpO2 95 % 04/01/2018  9:13 AM  Vitals shown include unvalidated device data.  Last Pain:  Vitals:   04/01/18 0628  TempSrc:   PainSc: 0-No pain         Complications: No apparent anesthesia complications

## 2018-04-01 NOTE — Brief Op Note (Signed)
04/01/2018  9:15 AM  PATIENT:  Jennifer Martin  62 y.o. female  PRE-OPERATIVE DIAGNOSIS:  DACRYOCYSTITIDS OF RIGHT LACRIMAL SAC  POST-OPERATIVE DIAGNOSIS:  DACRYOCYSTITIDS OF RIGHT LACRIMAL SAC  PROCEDURE:  Procedure(s): LACRIMAL DUCT EXPLORATION AND ETHMOIDECTOMY (Right) TEAR DUCT PROBING WITH STENT (Right)  SURGEON:  Surgeon(s) and Role:    * Sheba Whaling, Levester Fresh, MD - Primary  PHYSICIAN ASSISTANT: none  ASSISTANTS: none   ANESTHESIA:   local and general  EBL:  10 mL   BLOOD ADMINISTERED:none  DRAINS: Crawford Stent Right Side   LOCAL MEDICATIONS USED:  BUPIVICAINE  and LIDOCAINE   SPECIMEN:  Source of Specimen:  Nasolacrimal Duct  DISPOSITION OF SPECIMEN:  PATHOLOGY  COUNTS:  YES  TOURNIQUET:  * No tourniquets in log *  DICTATION: .Note written in EPIC  PLAN OF CARE: Discharge to home after PACU  PATIENT DISPOSITION:  PACU - hemodynamically stable.   Delay start of Pharmacological VTE agent (>24hrs) due to surgical blood loss or risk of bleeding: yes for 24hrs

## 2018-04-01 NOTE — Anesthesia Preprocedure Evaluation (Addendum)
Anesthesia Evaluation  Patient identified by MRN, date of birth, ID band Patient awake    Reviewed: Allergy & Precautions, NPO status , Patient's Chart, lab work & pertinent test results  History of Anesthesia Complications (+) DIFFICULT AIRWAY  Airway Mallampati: III  TM Distance: <3 FB Neck ROM: Full    Dental no notable dental hx.    Pulmonary neg pulmonary ROS, Current Smoker,    Pulmonary exam normal breath sounds clear to auscultation       Cardiovascular hypertension, + CAD, + Past MI and +CHF  Normal cardiovascular exam+ dysrhythmias Atrial Fibrillation  Rhythm:Regular Rate:Normal  EF 41%   Neuro/Psych negative neurological ROS  negative psych ROS   GI/Hepatic negative GI ROS, Neg liver ROS,   Endo/Other  negative endocrine ROS  Renal/GU negative Renal ROS  negative genitourinary   Musculoskeletal negative musculoskeletal ROS (+)   Abdominal   Peds negative pediatric ROS (+)  Hematology negative hematology ROS (+)   Anesthesia Other Findings   Reproductive/Obstetrics negative OB ROS                            Anesthesia Physical Anesthesia Plan  ASA: III  Anesthesia Plan: General   Post-op Pain Management:    Induction: Intravenous  PONV Risk Score and Plan: 2 and Ondansetron, Dexamethasone and Treatment may vary due to age or medical condition  Airway Management Planned: Oral ETT and Video Laryngoscope Planned  Additional Equipment:   Intra-op Plan:   Post-operative Plan: Extubation in OR  Informed Consent: I have reviewed the patients History and Physical, chart, labs and discussed the procedure including the risks, benefits and alternatives for the proposed anesthesia with the patient or authorized representative who has indicated his/her understanding and acceptance.   Dental advisory given  Plan Discussed with: CRNA and Surgeon  Anesthesia Plan Comments:         Anesthesia Quick Evaluation

## 2018-04-01 NOTE — OR Nursing (Signed)
Called Dr. Harvel Quale - patient to continue antibiotic eyedrops she received at urgent care. Maxitrol drops were called in from office to patient pharmacy. Patient and husband informed.

## 2018-04-01 NOTE — H&P (Signed)
Subjective:    Jennifer Martin is a 62 y.o. female who presents for evaluation of chronic nasolacrimal duct obstruction. The pain is described as throbbing. Onset was several weeks ago. Symptoms have been unchanged since.   Review of Systems Pertinent items are noted in HPI.    Objective:   BP 115/68   Pulse 64   Temp 98.2 F (36.8 C) (Oral)   Resp 20   Ht 5' (1.524 m)   Wt 59 kg (130 lb)   SpO2 99%   BMI 25.39 kg/m   General:  alert, cooperative and appears stated age Skin:  normal Eyes: conjunctivae/corneas clear. PERRL, EOM's intact. Fundi benign., sac distended on the right side, dacryocystitis  Mouth: MMM no lesions Lymph Nodes:  Cervical, supraclavicular, and axillary nodes normal. Lungs:  clear to auscultation bilaterally Heart:  regular rate and rhythm, S1, S2 normal, no murmur, click, rub or gallop Abdomen: soft, non-tender; bowel sounds normal; no masses,  no organomegaly CVA:  absent Genitourinary: defer exam Extremities:  extremities normal, atraumatic, no cyanosis or edema Neurologic:  negative Psychiatric:  normal mood, behavior, speech, dress, and thought processes    Assessment: Chronic Nasolacrimal Duct Obstruction Right Side Secondary to Dacryocystitis   Plan: Dacryocystorhinostomy with Anterior Ethmoidectomy and Dilation, Probing and Stent Placement on the Right Side  1. Discussed the risk of surgery,  and the risks of general anesthetic including MI, CVA, sudden death or even reaction to anesthetic medications. The patient understands the risks, any and all questions were answered to the patient's satisfaction. 2. Follow up: 1 day.  Date of Surgery Update (To be completed by Attending Surgeon day of surgery.)

## 2018-04-01 NOTE — Anesthesia Postprocedure Evaluation (Signed)
Anesthesia Post Note  Patient: SARAHA GUGGENHEIM  Procedure(s) Performed: LACRIMAL DUCT EXPLORATION AND ETHMOIDECTOMY (Right Eye) TEAR DUCT PROBING WITH STENT (Right Eye)     Patient location during evaluation: PACU Anesthesia Type: General Level of consciousness: awake and alert Pain management: pain level controlled Vital Signs Assessment: post-procedure vital signs reviewed and stable Respiratory status: spontaneous breathing, nonlabored ventilation, respiratory function stable and patient connected to nasal cannula oxygen Cardiovascular status: blood pressure returned to baseline and stable Postop Assessment: no apparent nausea or vomiting Anesthetic complications: no    Last Vitals:  Vitals:   04/01/18 0551 04/01/18 0915  BP: 115/68 134/72  Pulse: 64   Resp: 20   Temp: 36.8 C (!) 36.1 C  SpO2: 99%     Last Pain:  Vitals:   04/01/18 0628  TempSrc:   PainSc: 0-No pain                 Rivaan Kendall S

## 2018-04-01 NOTE — Anesthesia Procedure Notes (Addendum)
Procedure Name: Intubation Date/Time: 04/01/2018 7:41 AM Performed by: Fransisca Kaufmann, CRNA Pre-anesthesia Checklist: Patient identified, Emergency Drugs available, Suction available and Patient being monitored Patient Re-evaluated:Patient Re-evaluated prior to induction Oxygen Delivery Method: Circle System Utilized Preoxygenation: Pre-oxygenation with 100% oxygen Induction Type: IV induction Ventilation: Mask ventilation without difficulty Laryngoscope Size: Glidescope and 3 Grade View: Grade I Tube type: Oral Tube size: 6.0 mm Number of attempts: 1 Airway Equipment and Method: Stylet,  Oral airway,  Rigid stylet and Video-laryngoscopy Placement Confirmation: ETT inserted through vocal cords under direct vision,  positive ETCO2 and breath sounds checked- equal and bilateral Secured at: 22 cm Tube secured with: Tape Dental Injury: Teeth and Oropharynx as per pre-operative assessment  Difficulty Due To: Difficulty was anticipated, Difficult Airway- due to reduced neck mobility, Difficult Airway- due to limited oral opening, Difficult Airway- due to dentition and Difficult Airway- due to anterior larynx Comments: Previously documented difficult airway 13 years ago/resulting in trach and 30 day stay on vent in ICU at [facility] ([place]). Pt intubated for procedure 05/2017 successfully DL x 1 /note read/spoke with patient along with Dr Rose/questions answered.

## 2018-04-02 ENCOUNTER — Encounter (HOSPITAL_COMMUNITY): Payer: Self-pay | Admitting: Oculoplastics Ophthalmology

## 2018-04-18 NOTE — Progress Notes (Signed)
Cardiology Office Note:    Date:  04/19/2018   ID:  Jennifer Martin, DOB Mar 03, 1956, MRN [id number]  PCP:  [name]., MD  Cardiologist:  [name], MD    Referring MD: [name]., MD    ASSESSMENT:    1. Chronic combined systolic and diastolic heart failure (HCC)   2. Coronary artery disease involving native coronary artery of native heart with angina pectoris (HCC)   3. Paroxysmal atrial fibrillation (HCC)   4. Atypical atrial flutter (HCC)   5. Left bundle branch block (LBBB)   6. Hyperlipidemia, unspecified hyperlipidemia type   7. Chronic anticoagulation    PLAN:    In order of problems listed above:  1. Stable compensated continue current treatment if blood pressure were to be low I would decrease the dose of her vasodilator as her ejection fraction is normalized. 2. Stable continue current medical treatment I do not see the need for ischemia evaluation at this time 3. Stable off antiarrhythmic drug she will remain anticoagulated and has had a good clinical response to EP catheter ablation 4. Stable see above 5. Stable pattern on EKG March 2019 6. Stable continue a low intensity statin as she is poorly tolerant with muscle symptoms recheck liver function lipid profile and a BNP level 7. Stable continue her current anticoagulant   Next appointment: 6 months   Medication Adjustments/Labs and Tests Ordered: Current medicines are reviewed at length with the patient today.  Concerns regarding medicines are outlined above.  No orders of the defined types were placed in this encounter.  No orders of the defined types were placed in this encounter.   No chief complaint on file.   History of Present Illness:    Jennifer Martin is a 62 y.o. female with a hx of CAD,Heart failure with EF normalized with ARNI ,Dyslipidemia, HTN,PAF, COPD  andatrial flutter status post atrial fibrillation/flutter 05/29/17 last seen by me 01/14/18. She is off amiodarone but remains  anticoagulated. Compliance with diet, lifestyle and medications: Yes  Recently she had orbital cellulitis requiring IV antibiotics and eye surgery.  Fortunately her heart disease has been stable she has had no shortness of breath edema chest pain palpitation or syncope.  She has had no persistent fever or signs of bacteremia. Past Medical History:  Diagnosis Date  . Anxiety   . Atherosclerosis of coronary artery of native heart with angina pectoris (HCC)   . Atrial flutter (HCC)   . CAD (coronary artery disease)   . CHF (congestive heart failure) (HCC)   . COPD (chronic obstructive pulmonary disease) (HCC)   . Demand ischemia (HCC)   . Depression   . Difficult intubation    " small airway and need a little tube "  . Diverticulosis   . GERD (gastroesophageal reflux disease)   . Hyperlipidemia   . Hypertension   . Inappropriate ADH syndrome (HCC)   . LBBB (left bundle branch block)   . Migraine    "none since ~ 2012; mild ones then when I did have them because of the beta blockers I was on" (06/09/2017)  . Myocardial infarction (HCC)    "I've had light ones" (06/09/2017)  . On amiodarone therapy 04/13/2017  . Osteoarthritis   . Pneumonia    "couple times" (06/09/2017)    Past Surgical History:  Procedure Laterality Date  . ATRIAL FIBRILLATION ABLATION N/A 05/29/2017   Procedure: Atrial Fibrillation Ablation;  Surgeon: Regan Lemming, MD;  Location: [facility] INVASIVE CV  LAB;  Service: Cardiovascular;  Laterality: N/A;  . BREAST SURGERY Left    "took a gland out; milk duct"  . CARDIAC CATHETERIZATION  2008   has had 2 procedures, the last one approx 2008, never had PCI/stent. Dr [name]  . CARPAL TUNNEL RELEASE Left   . DILATION AND CURETTAGE OF UTERUS     "related to heavy bleeding"  . INGUINAL HERNIA REPAIR Left   . LACRIMAL DUCT EXPLORATION Right 04/01/2018   Procedure: LACRIMAL DUCT EXPLORATION AND ETHMOIDECTOMY;  Surgeon: Floydene Flock, MD;  Location: [facility] OR;  Service:  Ophthalmology;  Laterality: Right;  . SHOULDER ARTHROSCOPY WITH ROTATOR CUFF REPAIR Right   . TEAR DUCT PROBING Right 04/01/2018   Procedure: TEAR DUCT PROBING WITH STENT;  Surgeon: Floydene Flock, MD;  Location: [facility] OR;  Service: Ophthalmology;  Laterality: Right;  . TRACHEOSTOMY  2006   "closed on it's own"  . TUBAL LIGATION    . VAGINAL HYSTERECTOMY     "fibroids"    Current Medications: No outpatient medications have been marked as taking for the 04/19/18 encounter (Appointment) with Baldo Daub, MD.     Allergies:   Atorvastatin; Cefdinir; Levofloxacin; and Morphine   Social History   Socioeconomic History  . Marital status: Married    Spouse name: Not on file  . Number of children: Not on file  . Years of education: Not on file  . Highest education level: Not on file  Occupational History  . Not on file  Social Needs  . Financial resource strain: Not on file  . Food insecurity:    Worry: Not on file    Inability: Not on file  . Transportation needs:    Medical: Not on file    Non-medical: Not on file  Tobacco Use  . Smoking status: Current Every Day Smoker    Packs/day: 0.50    Years: 34.00    Pack years: 17.00    Types: Cigarettes, E-cigarettes  . Smokeless tobacco: Never Used  Substance and Sexual Activity  . Alcohol use: No  . Drug use: No  . Sexual activity: Not Currently  Lifestyle  . Physical activity:    Days per week: Not on file    Minutes per session: Not on file  . Stress: Not on file  Relationships  . Social connections:    Talks on phone: Not on file    Gets together: Not on file    Attends religious service: Not on file    Active member of club or organization: Not on file    Attends meetings of clubs or organizations: Not on file    Relationship status: Not on file  Other Topics Concern  . Not on file  Social History Narrative  . Not on file     Family History: The patient's family history includes CAD in her brother; Heart  disease in her brother. ROS:   Please see the history of present illness.    All other systems reviewed and are negative.  EKGs/Labs/Other Studies Reviewed:    The following studies were reviewed today:   Recent Labs: 04/20/2017: NT-Pro BNP 795 08/06/2017: TSH 2.260 11/10/2017: ALT 19; BNP 107.6 01/14/2018: Magnesium 2.2 04/01/2018: BUN 10; Creatinine, Ser 0.61; Hemoglobin 11.6; Platelets 268; Potassium 3.6; Sodium 137  Recent Lipid Panel    Component Value Date/Time   CHOL 217 (H) 11/10/2017 1620   TRIG 61 11/10/2017 1620   HDL 101 11/10/2017 1620   CHOLHDL 2.1 11/10/2017 1620  CHOLHDL 3.2 06/11/2017 1439   VLDL 20 06/11/2017 1439   LDLCALC 104 (H) 11/10/2017 1620    Physical Exam:    VS:  There were no vitals taken for this visit.    Wt Readings from Last 3 Encounters:  04/01/18 130 lb (59 kg)  01/14/18 131 lb 6.4 oz (59.6 kg)  12/23/17 129 lb (58.5 kg)     GEN:  Well nourished, well developed in no acute distress HEENT: Normal NECK: No JVD; No carotid bruits LYMPHATICS: No lymphadenopathy CARDIAC: RRR, no murmurs, rubs, gallops RESPIRATORY:  Clear to auscultation without rales, wheezing or rhonchi  ABDOMEN: Soft, non-tender, non-distended MUSCULOSKELETAL:  No edema; No deformity  SKIN: Warm and dry NEUROLOGIC:  Alert and oriented x 3 PSYCHIATRIC:  Normal affect    Signed, [name], MD  04/19/2018 11:21 AM    [facility]

## 2018-04-19 ENCOUNTER — Ambulatory Visit: Payer: PPO | Admitting: Cardiology

## 2018-04-19 ENCOUNTER — Encounter: Payer: Self-pay | Admitting: Cardiology

## 2018-04-19 VITALS — BP 104/72 | HR 85 | Ht 60.0 in | Wt 133.2 lb

## 2018-04-19 DIAGNOSIS — E785 Hyperlipidemia, unspecified: Secondary | ICD-10-CM | POA: Diagnosis not present

## 2018-04-19 DIAGNOSIS — I25119 Atherosclerotic heart disease of native coronary artery with unspecified angina pectoris: Secondary | ICD-10-CM | POA: Diagnosis not present

## 2018-04-19 DIAGNOSIS — Z7901 Long term (current) use of anticoagulants: Secondary | ICD-10-CM

## 2018-04-19 DIAGNOSIS — I5042 Chronic combined systolic (congestive) and diastolic (congestive) heart failure: Secondary | ICD-10-CM | POA: Diagnosis not present

## 2018-04-19 DIAGNOSIS — I484 Atypical atrial flutter: Secondary | ICD-10-CM | POA: Diagnosis not present

## 2018-04-19 DIAGNOSIS — I447 Left bundle-branch block, unspecified: Secondary | ICD-10-CM

## 2018-04-19 DIAGNOSIS — I480 Paroxysmal atrial fibrillation: Secondary | ICD-10-CM | POA: Diagnosis not present

## 2018-04-19 MED ORDER — NITROGLYCERIN 0.4 MG SL SUBL: 0.4000 mg | SUBLINGUAL_TABLET | SUBLINGUAL | 11 refills | Status: DC | PRN

## 2018-04-19 NOTE — Patient Instructions (Signed)
Medication Instructions:  Your physician recommends that you continue on your current medications as directed. Please refer to the Current Medication list given to you today.   Labwork: Your physician recommends that you have lab work today: CMP, Lipid, and BNP   Testing/Procedures: NONE  Follow-Up: Your physician wants you to follow-up in: 6 months.   You will receive a reminder letter in the mail two months in advance. If you don't receive a letter, please call our office to schedule the follow-up appointment.   Any Other Special Instructions Will Be Listed Below (If Applicable).     If you need a refill on your cardiac medications before your next appointment, please call your pharmacy.

## 2018-04-20 LAB — COMPREHENSIVE METABOLIC PANEL
ALBUMIN: 4.9 g/dL — AB (ref 3.6–4.8)
ALT: 14 IU/L (ref 0–32)
AST: 19 IU/L (ref 0–40)
Albumin/Globulin Ratio: 2 (ref 1.2–2.2)
Alkaline Phosphatase: 77 IU/L (ref 39–117)
BUN / CREAT RATIO: 24 (ref 12–28)
BUN: 13 mg/dL (ref 8–27)
Bilirubin Total: 0.2 mg/dL (ref 0.0–1.2)
CALCIUM: 9.8 mg/dL (ref 8.7–10.3)
CO2: 27 mmol/L (ref 20–29)
Chloride: 100 mmol/L (ref 96–106)
Creatinine, Ser: 0.54 mg/dL — ABNORMAL LOW (ref 0.57–1.00)
GFR calc Af Amer: 116 mL/min/{1.73_m2} (ref >59)
GFR, EST NON AFRICAN AMERICAN: 101 mL/min/{1.73_m2} (ref >59)
GLOBULIN, TOTAL: 2.5 g/dL (ref 1.5–4.5)
Glucose: 85 mg/dL (ref 65–99)
Potassium: 4.7 mmol/L (ref 3.5–5.2)
SODIUM: 142 mmol/L (ref 134–144)
TOTAL PROTEIN: 7.4 g/dL (ref 6.0–8.5)

## 2018-04-20 LAB — LIPID PANEL
CHOL/HDL RATIO: 3 ratio (ref 0.0–4.4)
Cholesterol, Total: 213 mg/dL — ABNORMAL HIGH (ref 100–199)
HDL: 71 mg/dL (ref >39)
LDL Calculated: 115 mg/dL — ABNORMAL HIGH (ref 0–99)
TRIGLYCERIDES: 135 mg/dL (ref 0–149)
VLDL Cholesterol Cal: 27 mg/dL (ref 5–40)

## 2018-04-20 LAB — PRO B NATRIURETIC PEPTIDE: NT-Pro BNP: 177 pg/mL (ref 0–287)

## 2018-04-21 ENCOUNTER — Other Ambulatory Visit: Payer: Self-pay | Admitting: Cardiology

## 2018-04-21 DIAGNOSIS — I4892 Unspecified atrial flutter: Secondary | ICD-10-CM

## 2018-04-21 DIAGNOSIS — I25118 Atherosclerotic heart disease of native coronary artery with other forms of angina pectoris: Secondary | ICD-10-CM

## 2018-04-21 DIAGNOSIS — I480 Paroxysmal atrial fibrillation: Secondary | ICD-10-CM

## 2018-04-21 DIAGNOSIS — I5043 Acute on chronic combined systolic (congestive) and diastolic (congestive) heart failure: Secondary | ICD-10-CM

## 2018-06-09 ENCOUNTER — Ambulatory Visit: Payer: PPO | Admitting: Cardiology

## 2018-06-23 ENCOUNTER — Ambulatory Visit: Payer: PPO | Admitting: Cardiology

## 2018-06-23 ENCOUNTER — Encounter: Payer: Self-pay | Admitting: Cardiology

## 2018-06-23 VITALS — BP 120/60 | HR 70 | Ht 60.0 in | Wt 130.0 lb

## 2018-06-23 DIAGNOSIS — R002 Palpitations: Secondary | ICD-10-CM

## 2018-06-23 DIAGNOSIS — I10 Essential (primary) hypertension: Secondary | ICD-10-CM

## 2018-06-23 DIAGNOSIS — I483 Typical atrial flutter: Secondary | ICD-10-CM

## 2018-06-23 DIAGNOSIS — I480 Paroxysmal atrial fibrillation: Secondary | ICD-10-CM | POA: Diagnosis not present

## 2018-06-23 DIAGNOSIS — I2510 Atherosclerotic heart disease of native coronary artery without angina pectoris: Secondary | ICD-10-CM

## 2018-06-23 NOTE — Patient Instructions (Signed)
Medication Instructions:  Your physician recommends that you continue on your current medications as directed. Please refer to the Current Medication list given to you today.  * If you need a refill on your cardiac medications before your next appointment, please call your pharmacy.   Labwork: None ordered  Testing/Procedures: None ordered  Follow-Up: Your physician recommends that you schedule a follow-up appointment in: December with Dr. [name] in [place].  Your physician wants you to follow-up in: 6 months with Dr. Elberta Fortis in [place].  You will receive a reminder letter in the mail two months in advance. If you don't receive a letter, please call our office to schedule the follow-up appointment.   Thank you for choosing [facility]!!   Dory Horn, RN [phone]

## 2018-06-23 NOTE — Progress Notes (Signed)
Electrophysiology Office Note   Date:  06/23/2018   ID:  Jennifer Martin, DOB 11/25/1955, MRN [id number]  PCP:  [name]., MD  Cardiologist:  [name] Primary Electrophysiologist:  Ferry Matthis [name], MD    No chief complaint on file.    History of Present Illness: Jennifer Martin is a 62 y.o. female who is being seen today for the evaluation of atrial flutter at the request of [name]. Presenting today for electrophysiology evaluation. She has a history of coronary artery disease, hyperlipidemia, hypertension, and atrial flutter. She also has COPD with a demand ischemia and heart failure. She was admitted to [facility] in atrial fibrillation in April. She has no angina only mild shortness of breath, felt related to her COPD. She has not had syncope or bleeding complications of her anticoagulants. She was placed on amiodarone during her hospitalization in April.  He had an atrial fibrillation ablation 05/29/17.  Today, denies symptoms of palpitations, chest pain, shortness of breath, orthopnea, PND, lower extremity edema, claudication, dizziness, presyncope, syncope, bleeding, or neurologic sequela. The patient is tolerating medications without difficulties.  Overall she is doing well.  She has noted no further episodes of atrial fibrillation or atrial flutter.  She is able to do all of her daily activities without issue.  She is exercising without any problems.  Past Medical History:  Diagnosis Date  . Anxiety   . Atherosclerosis of coronary artery of native heart with angina pectoris (HCC)   . Atrial flutter (HCC)   . CAD (coronary artery disease)   . CHF (congestive heart failure) (HCC)   . COPD (chronic obstructive pulmonary disease) (HCC)   . Demand ischemia (HCC)   . Depression   . Difficult intubation    " small airway and need a little tube "  . Diverticulosis   . GERD (gastroesophageal reflux disease)   . Hyperlipidemia   . Hypertension   . Inappropriate ADH  syndrome (HCC)   . LBBB (left bundle branch block)   . Migraine    "none since ~ 2012; mild ones then when I did have them because of the beta blockers I was on" (06/09/2017)  . Myocardial infarction (HCC)    "I've had light ones" (06/09/2017)  . On amiodarone therapy 04/13/2017  . Osteoarthritis   . Pneumonia    "couple times" (06/09/2017)   Past Surgical History:  Procedure Laterality Date  . ATRIAL FIBRILLATION ABLATION N/A 05/29/2017   Procedure: Atrial Fibrillation Ablation;  Surgeon: Regan Lemming, MD;  Location: [facility] INVASIVE CV LAB;  Service: Cardiovascular;  Laterality: N/A;  . BREAST SURGERY Left    "took a gland out; milk duct"  . CARDIAC CATHETERIZATION  2008   has had 2 procedures, the last one approx 2008, never had PCI/stent. Dr [name]  . CARPAL TUNNEL RELEASE Left   . DILATION AND CURETTAGE OF UTERUS     "related to heavy bleeding"  . INGUINAL HERNIA REPAIR Left   . LACRIMAL DUCT EXPLORATION Right 04/01/2018   Procedure: LACRIMAL DUCT EXPLORATION AND ETHMOIDECTOMY;  Surgeon: Floydene Flock, MD;  Location: [facility] OR;  Service: Ophthalmology;  Laterality: Right;  . SHOULDER ARTHROSCOPY WITH ROTATOR CUFF REPAIR Right   . TEAR DUCT PROBING Right 04/01/2018   Procedure: TEAR DUCT PROBING WITH STENT;  Surgeon: Floydene Flock, MD;  Location: [facility] OR;  Service: Ophthalmology;  Laterality: Right;  . TRACHEOSTOMY  2006   "closed on it's own"  . TUBAL LIGATION    .  VAGINAL HYSTERECTOMY     "fibroids"     Current Outpatient Medications  Medication Sig Dispense Refill  . acetaminophen (TYLENOL) 500 MG tablet Take 1,000 mg by mouth 2 (two) times daily as needed for mild pain or headache.    . albuterol (PROVENTIL HFA;VENTOLIN HFA) 108 (90 Base) MCG/ACT inhaler Inhale 2 puffs into the lungs every 6 (six) hours as needed for wheezing or shortness of breath.    . budesonide-formoterol (SYMBICORT) 160-4.5 MCG/ACT inhaler Inhale 2 puffs into the lungs 2 (two) times daily as  needed.     . Calcium Carbonate-Vitamin D (CALCIUM 600+D PO) Take 2 tablets by mouth daily.    Marland Kitchen escitalopram (LEXAPRO) 10 MG tablet Take 10 mg by mouth daily.     . fluticasone (FLONASE) 50 MCG/ACT nasal spray Place 2 sprays into both nostrils daily.     . furosemide (LASIX) 20 MG tablet Take 1 tablet (20 mg total) by mouth 2 (two) times daily. Take only one daily if you weigh 124l bs or less 30 tablet 11  . LORazepam (ATIVAN) 1 MG tablet Take 1 mg by mouth 3 (three) times daily as needed for anxiety.     . nitroGLYCERIN (NITROSTAT) 0.4 MG SL tablet Place 1 tablet (0.4 mg total) under the tongue every 5 (five) minutes as needed for chest pain. 25 tablet 11  . omega-3 acid ethyl esters (LOVAZA) 1 g capsule Take 2 g by mouth daily.    . pantoprazole (PROTONIX) 40 MG tablet Take 40 mg by mouth daily.    . potassium chloride SA (K-DUR,KLOR-CON) 20 MEQ tablet TAKE 1/2 TABLET BY MOUTH TWICE DAILY 30 tablet 6  . pravastatin (PRAVACHOL) 20 MG tablet Take 1 tablet (20 mg total) by mouth every evening. 90 tablet 3  . rivaroxaban (XARELTO) 20 MG TABS tablet Take 1 tablet (20 mg total) daily by mouth. 90 tablet 3  . sacubitril-valsartan (ENTRESTO) 97-103 MG Take 1 tablet by mouth 2 (two) times daily. 180 tablet 3   No current facility-administered medications for this visit.     Allergies:   Atorvastatin; Cefdinir; Levofloxacin; and Morphine   Social History:  The patient  reports that she has been smoking cigarettes and e-cigarettes. She has a 17.00 pack-year smoking history. She has never used smokeless tobacco. She reports that she does not drink alcohol or use drugs.   Family History:  The patient's family history includes CAD in her brother; Heart disease in her brother.   ROS:  Please see the history of present illness.   Otherwise, review of systems is positive for none.   All other systems are reviewed and negative.   PHYSICAL EXAM: VS:  BP 120/60   Pulse 70   Ht 5' (1.524 m)   Wt 130 lb  (59 kg)   BMI 25.39 kg/m  , BMI Body mass index is 25.39 kg/m. GEN: Well nourished, well developed, in no acute distress  HEENT: normal  Neck: no JVD, carotid bruits, or masses Cardiac: RRR; no murmurs, rubs, or gallops,no edema  Respiratory:  clear to auscultation bilaterally, normal work of breathing GI: soft, nontender, nondistended, + BS MS: no deformity or atrophy  Skin: warm and dry Neuro:  Strength and sensation are intact Psych: euthymic mood, full affect  EKG:  EKG is ordered today. Personal review of the ekg ordered shows this rhythm, left bundle branch block, PA  Recent Labs: 08/06/2017: TSH 2.260 11/10/2017: BNP 107.6 01/14/2018: Magnesium 2.2 04/01/2018: Hemoglobin 11.6; Platelets 268 04/19/2018:  ALT 14; BUN 13; Creatinine, Ser 0.54; NT-Pro BNP 177; Potassium 4.7; Sodium 142    Lipid Panel     Component Value Date/Time   CHOL 213 (H) 04/19/2018 1201   TRIG 135 04/19/2018 1201   HDL 71 04/19/2018 1201   CHOLHDL 3.0 04/19/2018 1201   CHOLHDL 3.2 06/11/2017 1439   VLDL 20 06/11/2017 1439   LDLCALC 115 (H) 04/19/2018 1201     Wt Readings from Last 3 Encounters:  06/23/18 130 lb (59 kg)  04/19/18 133 lb 3.2 oz (60.4 kg)  04/01/18 130 lb (59 kg)      Other studies Reviewed: Additional studies/ records that were reviewed today include: TTE 12/15/17 - Left ventricle: The cavity size was normal. Wall thickness was   increased in a pattern of mild LVH. Systolic function was normal.   Wall motion was normal; there were no regional wall motion   abnormalities. Doppler parameters are consistent with abnormal   left ventricular relaxation (grade 1 diastolic dysfunction). - Aortic valve: There was mild regurgitation. Valve area (VTI):   3.03 cm^2. Valve area (Vmax): 2.51 cm^2. Valve area (Vmean): 2.51   cm^2. - Mitral valve: There was mild regurgitation. - Left atrium: The atrium was moderately dilated.  ASSESSMENT AND PLAN:  1.  Atrial fibrillation/flutter:  Currently on Xarelto.  Status post atrial fibrillation flutter ablation 05/29/2017.  She is currently on not on antiarrhythmics.  No recurrences.     This patients CHA2DS2-VASc Score and unadjusted Ischemic Stroke Rate (% per year) is equal to 3.2 % stroke rate/year from a score of 3  Above score calculated as 1 point each if present [CHF, HTN, DM, Vascular=MI/PAD/Aortic Plaque, Age if 65-74, or Female] Above score calculated as 2 points each if present [Age > 75, or Stroke/TIA/TE]    2. Hypertensive heart disease with heart failure: Blood pressure well controlled today.  No changes.  3. Hyperlipidemia: Continue statin  4. Coronary artery disease with stable angina: No current chest pain.  Continue Plavix  5.  Palpitations: Likely due to APCs or PVCs based on auscultation.  No changes at this time.  Minimally symptomatic.  Current medicines are reviewed at length with the patient today.   The patient does not have concerns regarding her medicines.  The following changes were made today: None  Labs/ tests ordered today include:  Orders Placed This Encounter  Procedures  . EKG 12-Lead     Disposition:   FU with Amiliana Foutz 6 months  Signed, Shilpa Bushee [name], MD  06/23/2018 2:23 PM     [facility] [address] [place] [address] [phone] (office) [phone] (fax)

## 2018-07-13 ENCOUNTER — Telehealth: Payer: Self-pay | Admitting: Cardiology

## 2018-07-13 NOTE — Telephone Encounter (Signed)
Patient states that she has still not received her meds and the portal states that it went to [facility] office but no one there states they have seen it. Please call her.

## 2018-07-13 NOTE — Telephone Encounter (Signed)
Left message on patients home phone to return call. I also called cell phone, but was unable to leave a voicemail as it was not set up.  Attempted to reach patient at [phone], but that is not a working number.

## 2018-07-14 NOTE — Telephone Encounter (Signed)
Patient states that someone from [facility] is supposed to call her today to notify her the tracking of her medications.  She will contact our office with any other information that she feels we will need to know.

## 2018-07-15 DIAGNOSIS — I5022 Chronic systolic (congestive) heart failure: Secondary | ICD-10-CM | POA: Diagnosis not present

## 2018-07-15 DIAGNOSIS — M1A09X0 Idiopathic chronic gout, multiple sites, without tophus (tophi): Secondary | ICD-10-CM | POA: Diagnosis not present

## 2018-07-15 DIAGNOSIS — F419 Anxiety disorder, unspecified: Secondary | ICD-10-CM | POA: Diagnosis not present

## 2018-07-15 DIAGNOSIS — M150 Primary generalized (osteo)arthritis: Secondary | ICD-10-CM | POA: Diagnosis not present

## 2018-07-15 DIAGNOSIS — I110 Hypertensive heart disease with heart failure: Secondary | ICD-10-CM | POA: Diagnosis not present

## 2018-07-15 DIAGNOSIS — R5381 Other malaise: Secondary | ICD-10-CM | POA: Diagnosis not present

## 2018-07-15 DIAGNOSIS — E222 Syndrome of inappropriate secretion of antidiuretic hormone: Secondary | ICD-10-CM | POA: Diagnosis not present

## 2018-07-15 DIAGNOSIS — F330 Major depressive disorder, recurrent, mild: Secondary | ICD-10-CM | POA: Diagnosis not present

## 2018-07-15 DIAGNOSIS — E782 Mixed hyperlipidemia: Secondary | ICD-10-CM | POA: Diagnosis not present

## 2018-07-15 DIAGNOSIS — R5383 Other fatigue: Secondary | ICD-10-CM | POA: Diagnosis not present

## 2018-07-15 DIAGNOSIS — I4892 Unspecified atrial flutter: Secondary | ICD-10-CM | POA: Diagnosis not present

## 2018-07-15 DIAGNOSIS — J449 Chronic obstructive pulmonary disease, unspecified: Secondary | ICD-10-CM | POA: Diagnosis not present

## 2018-07-15 DIAGNOSIS — I25118 Atherosclerotic heart disease of native coronary artery with other forms of angina pectoris: Secondary | ICD-10-CM | POA: Diagnosis not present

## 2018-07-20 ENCOUNTER — Telehealth: Payer: Self-pay

## 2018-07-20 NOTE — Telephone Encounter (Signed)
Informed patient that her entresto was in the office, the patient will come Thursday to retrieve the meds.

## 2018-08-06 DIAGNOSIS — M1712 Unilateral primary osteoarthritis, left knee: Secondary | ICD-10-CM | POA: Diagnosis not present

## 2018-08-18 DIAGNOSIS — Z23 Encounter for immunization: Secondary | ICD-10-CM | POA: Diagnosis not present

## 2018-08-23 DIAGNOSIS — J449 Chronic obstructive pulmonary disease, unspecified: Secondary | ICD-10-CM | POA: Diagnosis not present

## 2018-08-23 DIAGNOSIS — F17200 Nicotine dependence, unspecified, uncomplicated: Secondary | ICD-10-CM | POA: Diagnosis not present

## 2018-09-09 DIAGNOSIS — M1712 Unilateral primary osteoarthritis, left knee: Secondary | ICD-10-CM | POA: Diagnosis not present

## 2018-09-13 DIAGNOSIS — H04221 Epiphora due to insufficient drainage, right lacrimal gland: Secondary | ICD-10-CM | POA: Diagnosis not present

## 2018-09-14 DIAGNOSIS — M1712 Unilateral primary osteoarthritis, left knee: Secondary | ICD-10-CM | POA: Diagnosis not present

## 2018-09-23 DIAGNOSIS — M7122 Synovial cyst of popliteal space [Baker], left knee: Secondary | ICD-10-CM | POA: Diagnosis not present

## 2018-09-23 DIAGNOSIS — S72422A Displaced fracture of lateral condyle of left femur, initial encounter for closed fracture: Secondary | ICD-10-CM | POA: Diagnosis not present

## 2018-09-23 DIAGNOSIS — M25562 Pain in left knee: Secondary | ICD-10-CM | POA: Diagnosis not present

## 2018-09-23 DIAGNOSIS — M25462 Effusion, left knee: Secondary | ICD-10-CM | POA: Diagnosis not present

## 2018-09-23 DIAGNOSIS — X500XXA Overexertion from strenuous movement or load, initial encounter: Secondary | ICD-10-CM | POA: Diagnosis not present

## 2018-09-23 DIAGNOSIS — S83242A Other tear of medial meniscus, current injury, left knee, initial encounter: Secondary | ICD-10-CM | POA: Diagnosis not present

## 2018-09-29 DIAGNOSIS — S83282A Other tear of lateral meniscus, current injury, left knee, initial encounter: Secondary | ICD-10-CM | POA: Diagnosis not present

## 2018-09-29 DIAGNOSIS — M1712 Unilateral primary osteoarthritis, left knee: Secondary | ICD-10-CM | POA: Diagnosis not present

## 2018-09-29 DIAGNOSIS — S838X2A Sprain of other specified parts of left knee, initial encounter: Secondary | ICD-10-CM | POA: Diagnosis not present

## 2018-09-30 DIAGNOSIS — F330 Major depressive disorder, recurrent, mild: Secondary | ICD-10-CM | POA: Diagnosis not present

## 2018-09-30 DIAGNOSIS — E782 Mixed hyperlipidemia: Secondary | ICD-10-CM | POA: Diagnosis not present

## 2018-09-30 DIAGNOSIS — I5022 Chronic systolic (congestive) heart failure: Secondary | ICD-10-CM | POA: Diagnosis not present

## 2018-09-30 DIAGNOSIS — M150 Primary generalized (osteo)arthritis: Secondary | ICD-10-CM | POA: Diagnosis not present

## 2018-09-30 DIAGNOSIS — F419 Anxiety disorder, unspecified: Secondary | ICD-10-CM | POA: Diagnosis not present

## 2018-09-30 DIAGNOSIS — I110 Hypertensive heart disease with heart failure: Secondary | ICD-10-CM | POA: Diagnosis not present

## 2018-09-30 DIAGNOSIS — J449 Chronic obstructive pulmonary disease, unspecified: Secondary | ICD-10-CM | POA: Diagnosis not present

## 2018-09-30 DIAGNOSIS — Z79899 Other long term (current) drug therapy: Secondary | ICD-10-CM | POA: Diagnosis not present

## 2018-09-30 DIAGNOSIS — F17210 Nicotine dependence, cigarettes, uncomplicated: Secondary | ICD-10-CM | POA: Insufficient documentation

## 2018-09-30 DIAGNOSIS — I25118 Atherosclerotic heart disease of native coronary artery with other forms of angina pectoris: Secondary | ICD-10-CM | POA: Diagnosis not present

## 2018-09-30 DIAGNOSIS — E222 Syndrome of inappropriate secretion of antidiuretic hormone: Secondary | ICD-10-CM | POA: Diagnosis not present

## 2018-09-30 DIAGNOSIS — I10 Essential (primary) hypertension: Secondary | ICD-10-CM | POA: Diagnosis not present

## 2018-09-30 DIAGNOSIS — F17200 Nicotine dependence, unspecified, uncomplicated: Secondary | ICD-10-CM | POA: Insufficient documentation

## 2018-09-30 DIAGNOSIS — I482 Chronic atrial fibrillation, unspecified: Secondary | ICD-10-CM | POA: Diagnosis not present

## 2018-09-30 HISTORY — DX: Nicotine dependence, unspecified, uncomplicated: F17.200

## 2018-09-30 NOTE — Progress Notes (Signed)
Cardiology Office Note:    Date:  10/01/2018   ID:  Jennifer Martin, DOB [birth year], MRN [id number]  PCP:  [name]., MD  Cardiologist:  [name], MD    Referring MD: [name]., MD    ASSESSMENT:    1. Preoperative cardiovascular examination   2. Hypertensive heart disease with heart failure (HCC)   3. Coronary artery disease involving native coronary artery of native heart with angina pectoris (HCC)   4. Chronic combined systolic and diastolic heart failure (HCC)   5. Mobitz type 1 second degree AV block   6. Chronic anticoagulation   7. Hyperlipidemia, unspecified hyperlipidemia type    PLAN:    In order of problems listed above:  1. She sees me prior to elective arthroscopic knee surgery for several reasons 1 is anticoagulation management told her told her to talk hold her anticoagulant 2 doses pre-2 doses post surgery she is in sinus rhythm and she is low risk for stroke.  The second is trouble she has had with airway injury in the past with intubation I told her my expectation she would not require intubation for the procedure and if necessary may want to have ENT present as we have done previously during general anesthesia for intubation I think she is low risk she could have this performed as an outpatient. 2. Stable heart failure compensated New [place] Heart Association class I she has no edema we will continue her current diuretic  Arni.  She is not on a beta-blocker because of COPD.  Recent renal function and potassium are normal 3. Stable CAD having no anginal discomfort class I continue medical treatment I do not think she requires an ischemia evaluation at this time her current medical regimen consist of her anticoagulation and her statin therapy. 4. Stable no recurrence of bradycardia off beta-blocker 5. See above for discussion of anticoagulant 6. Continue a low intensity statin she is poorly tolerant of high intensity preps.   Next appointment: 6  months   Medication Adjustments/Labs and Tests Ordered: Current medicines are reviewed at length with the patient today.  Concerns regarding medicines are outlined above.  Orders Placed This Encounter  Procedures  . EKG 12-Lead   No orders of the defined types were placed in this encounter.   No chief complaint on file.   History of Present Illness:    Jennifer Martin is a 62 y.o. female with a hx of CAD, Heart failure with EF normalized with ARNI ,Dyslipidemia, HTN, PAF, COPD  and  atrial flutter status post atrial fibrillation/flutter EP ablation 05/29/17  who was last seen by me 04/19/2018.  She is seen today in preoperative cardiology evaluation prior to orthopedic surgery with arthroscopic knee surgery.  Her last echocardiogram 12/15/2017 shows mild LVH normal left ventricular systolic function and mild mitral and aortic regurgitation Last myocardial perfusion study was performed 01/26/2017 showing no ischemia and the ejection fraction at that time 61%. She is followed by electrophysiology and has had a durable response to catheter ablation with no clinical recurrence of atrial fibrillation or flutter. She remains anticoagulated with rivaroxaban. Compliance with diet, lifestyle and medications: Yes  She is scheduled for arthroscopic knee surgery 10/22/2018.  She is anticoagulated with a history of atrial arrhythmia given instructions.  Previously during orthopedic surgery she had upper airway injury with stridor and airway obstruction and required urgent as well as short-term tracheostomy.  She is been very apprehensive of recurrent surgery and in the past  when she was intubated she had a ENT surgeon present performed the procedure.  I told her I suspect she will not require intubation and if that is the case we could be a same-day surgical procedure.  She is really done well she has no edema shortness of breath orthopnea no chest pain palpitations syncope TIA or bleeding complication of  her current anticoagulant Past Medical History:  Diagnosis Date  . Anxiety   . Atherosclerosis of coronary artery of native heart with angina pectoris (HCC)   . Atrial flutter (HCC)   . CAD (coronary artery disease)   . CHF (congestive heart failure) (HCC)   . COPD (chronic obstructive pulmonary disease) (HCC)   . Demand ischemia (HCC)   . Depression   . Difficult intubation    " small airway and need a little tube "  . Diverticulosis   . GERD (gastroesophageal reflux disease)   . Hyperlipidemia   . Hypertension   . Inappropriate ADH syndrome (HCC)   . LBBB (left bundle branch block)   . Migraine    "none since ~ 2012; mild ones then when I did have them because of the beta blockers I was on" (06/09/2017)  . Myocardial infarction (HCC)    "I've had light ones" (06/09/2017)  . On amiodarone therapy 04/13/2017  . Osteoarthritis   . Pneumonia    "couple times" (06/09/2017)    Past Surgical History:  Procedure Laterality Date  . ATRIAL FIBRILLATION ABLATION N/A 05/29/2017   Procedure: Atrial Fibrillation Ablation;  Surgeon: Regan Lemming, MD;  Location: [facility] INVASIVE CV LAB;  Service: Cardiovascular;  Laterality: N/A;  . BREAST SURGERY Left    "took a gland out; milk duct"  . CARDIAC CATHETERIZATION  2008   has had 2 procedures, the last one approx 2008, never had PCI/stent. Dr [name]  . CARPAL TUNNEL RELEASE Left   . DILATION AND CURETTAGE OF UTERUS     "related to heavy bleeding"  . INGUINAL HERNIA REPAIR Left   . LACRIMAL DUCT EXPLORATION Right 04/01/2018   Procedure: LACRIMAL DUCT EXPLORATION AND ETHMOIDECTOMY;  Surgeon: Floydene Flock, MD;  Location: [facility] OR;  Service: Ophthalmology;  Laterality: Right;  . SHOULDER ARTHROSCOPY WITH ROTATOR CUFF REPAIR Right   . TEAR DUCT PROBING Right 04/01/2018   Procedure: TEAR DUCT PROBING WITH STENT;  Surgeon: Floydene Flock, MD;  Location: [facility] OR;  Service: Ophthalmology;  Laterality: Right;  . TRACHEOSTOMY  2006   "closed on  it's own"  . TUBAL LIGATION    . VAGINAL HYSTERECTOMY     "fibroids"    Current Medications: Current Meds  Medication Sig  . acetaminophen (TYLENOL) 500 MG tablet Take 1,000 mg by mouth 2 (two) times daily as needed for mild pain or headache.  . albuterol (PROVENTIL HFA;VENTOLIN HFA) 108 (90 Base) MCG/ACT inhaler Inhale 2 puffs into the lungs every 6 (six) hours as needed for wheezing or shortness of breath.  . budesonide-formoterol (SYMBICORT) 160-4.5 MCG/ACT inhaler Inhale 2 puffs into the lungs 2 (two) times daily as needed.   Marland Kitchen escitalopram (LEXAPRO) 10 MG tablet Take 10 mg by mouth daily.   . fluticasone (FLONASE) 50 MCG/ACT nasal spray Place 2 sprays into both nostrils daily.   . furosemide (LASIX) 20 MG tablet Take 1 tablet (20 mg total) by mouth 2 (two) times daily. Take only one daily if you weigh 124l bs or less  . LORazepam (ATIVAN) 1 MG tablet Take 1 mg by mouth 3 (three) times  daily as needed for anxiety.   . nitroGLYCERIN (NITROSTAT) 0.4 MG SL tablet Place 1 tablet (0.4 mg total) under the tongue every 5 (five) minutes as needed for chest pain.  . pantoprazole (PROTONIX) 40 MG tablet Take 40 mg by mouth daily.  . potassium chloride SA (K-DUR,KLOR-CON) 20 MEQ tablet TAKE 1/2 TABLET BY MOUTH TWICE DAILY  . pravastatin (PRAVACHOL) 20 MG tablet Take 1 tablet (20 mg total) by mouth every evening. (Patient taking differently: Take 20 mg by mouth every other day. )  . rivaroxaban (XARELTO) 20 MG TABS tablet Take 1 tablet (20 mg total) daily by mouth.  . sacubitril-valsartan (ENTRESTO) 97-103 MG Take 1 tablet by mouth 2 (two) times daily.     Allergies:   Atorvastatin; Cefdinir; Levofloxacin; and Morphine   Social History   Socioeconomic History  . Marital status: Married    Spouse name: Not on file  . Number of children: Not on file  . Years of education: Not on file  . Highest education level: Not on file  Occupational History  . Not on file  Social Needs  . Financial  resource strain: Not on file  . Food insecurity:    Worry: Not on file    Inability: Not on file  . Transportation needs:    Medical: Not on file    Non-medical: Not on file  Tobacco Use  . Smoking status: Current Every Day Smoker    Packs/day: 0.50    Years: 34.00    Pack years: 17.00    Types: Cigarettes, E-cigarettes  . Smokeless tobacco: Never Used  Substance and Sexual Activity  . Alcohol use: No  . Drug use: No  . Sexual activity: Not Currently  Lifestyle  . Physical activity:    Days per week: Not on file    Minutes per session: Not on file  . Stress: Not on file  Relationships  . Social connections:    Talks on phone: Not on file    Gets together: Not on file    Attends religious service: Not on file    Active member of club or organization: Not on file    Attends meetings of clubs or organizations: Not on file    Relationship status: Not on file  Other Topics Concern  . Not on file  Social History Narrative  . Not on file     Family History: The patient's family history includes CAD in her brother; Heart disease in her brother. ROS:   Please see the history of present illness.    All other systems reviewed and are negative.  EKGs/Labs/Other Studies Reviewed:    The following studies were reviewed today:  EKG:  EKG ordered today.  The ekg ordered today demonstrates sinus rhythm left bundle branch block  Recent Labs: 07/15/18 CMP normal, Chol 208 HDl 55 LDlL 138 11/10/2017: BNP 107.6 01/14/2018: Magnesium 2.2 04/01/2018: Hemoglobin 11.6; Platelets 268 04/19/2018: ALT 14; BUN 13; Creatinine, Ser 0.54; NT-Pro BNP 177; Potassium 4.7; Sodium 142  Recent Lipid Panel    Component Value Date/Time   CHOL 213 (H) 04/19/2018 1201   TRIG 135 04/19/2018 1201   HDL 71 04/19/2018 1201   CHOLHDL 3.0 04/19/2018 1201   CHOLHDL 3.2 06/11/2017 1439   VLDL 20 06/11/2017 1439   LDLCALC 115 (H) 04/19/2018 1201    Physical Exam:    VS:  BP 130/82 (BP Location: Right  Arm, Patient Position: Sitting, Cuff Size: Normal)   Pulse 75   Ht  5' (1.524 m)   Wt 126 lb (57.2 kg)   SpO2 97%   BMI 24.61 kg/m     Wt Readings from Last 3 Encounters:  10/01/18 126 lb (57.2 kg)  06/23/18 130 lb (59 kg)  04/19/18 133 lb 3.2 oz (60.4 kg)     GEN:  Well nourished, well developed in no acute distress HEENT: Normal NECK: No JVD; No carotid bruits LYMPHATICS: No lymphadenopathy CARDIAC: RRR, no murmurs, rubs, gallops RESPIRATORY:  Clear to auscultation without rales, wheezing or rhonchi  ABDOMEN: Soft, non-tender, non-distended MUSCULOSKELETAL:  No edema; No deformity  SKIN: Warm and dry NEUROLOGIC:  Alert and oriented x 3 PSYCHIATRIC:  Normal affect    Signed, [name], MD  10/01/2018 11:37 AM    [facility]

## 2018-10-01 ENCOUNTER — Ambulatory Visit (INDEPENDENT_AMBULATORY_CARE_PROVIDER_SITE_OTHER): Payer: PPO | Admitting: Cardiology

## 2018-10-01 ENCOUNTER — Encounter: Payer: Self-pay | Admitting: Cardiology

## 2018-10-01 VITALS — BP 130/82 | HR 75 | Ht 60.0 in | Wt 126.0 lb

## 2018-10-01 DIAGNOSIS — I5042 Chronic combined systolic (congestive) and diastolic (congestive) heart failure: Secondary | ICD-10-CM | POA: Diagnosis not present

## 2018-10-01 DIAGNOSIS — Z01810 Encounter for preprocedural cardiovascular examination: Secondary | ICD-10-CM

## 2018-10-01 DIAGNOSIS — I25119 Atherosclerotic heart disease of native coronary artery with unspecified angina pectoris: Secondary | ICD-10-CM

## 2018-10-01 DIAGNOSIS — I110 Hypertensive heart disease with heart failure: Secondary | ICD-10-CM

## 2018-10-01 DIAGNOSIS — E785 Hyperlipidemia, unspecified: Secondary | ICD-10-CM | POA: Diagnosis not present

## 2018-10-01 DIAGNOSIS — I441 Atrioventricular block, second degree: Secondary | ICD-10-CM | POA: Diagnosis not present

## 2018-10-01 DIAGNOSIS — Z7901 Long term (current) use of anticoagulants: Secondary | ICD-10-CM | POA: Diagnosis not present

## 2018-10-01 HISTORY — DX: Encounter for preprocedural cardiovascular examination: Z01.810

## 2018-10-01 NOTE — Patient Instructions (Signed)
Medication Instructions:  Your physician recommends that you continue on your current medications as directed. Please refer to the Current Medication list given to you today.  **Hold xarelto for 2 doses before knee surgery!   If you need a refill on your cardiac medications before your next appointment, please call your pharmacy.   Lab work: None  If you have labs (blood work) drawn today and your tests are completely normal, you will receive your results only by: Marland Kitchen MyChart Message (if you have MyChart) OR . A paper copy in the mail If you have any lab test that is abnormal or we need to change your treatment, we will call you to review the results.  Testing/Procedures: You had an EKG today.   Follow-Up: At [facility], you and your health needs are our priority.  As part of our continuing mission to provide you with exceptional heart care, we have created designated Provider Care Teams.  These Care Teams include your primary Cardiologist (physician) and Advanced Practice Providers (APPs -  Physician Assistants and Nurse Practitioners) who all work together to provide you with the care you need, when you need it. You will need a follow up appointment in 6 months.  Please call our office 2 months in advance to schedule this appointment.

## 2018-10-06 ENCOUNTER — Other Ambulatory Visit: Payer: Self-pay | Admitting: Cardiology

## 2018-10-06 DIAGNOSIS — I5043 Acute on chronic combined systolic (congestive) and diastolic (congestive) heart failure: Secondary | ICD-10-CM

## 2018-10-14 ENCOUNTER — Telehealth: Payer: Self-pay | Admitting: Cardiology

## 2018-10-14 NOTE — Telephone Encounter (Signed)
Please call regarding medications

## 2018-10-14 NOTE — Telephone Encounter (Signed)
Jennifer Martin called regarding entresto. A prior authorization needs to be completed for entresto 97-103 mg. Will complete and submit as soon as possible.

## 2018-10-19 DIAGNOSIS — M25511 Pain in right shoulder: Secondary | ICD-10-CM | POA: Diagnosis not present

## 2018-10-20 HISTORY — PX: KNEE SURGERY: SHX244

## 2018-10-21 ENCOUNTER — Other Ambulatory Visit: Payer: Self-pay

## 2018-10-21 NOTE — Telephone Encounter (Signed)
Attempted to get a prior authorization on [email or web address] for Entresto 97-103mg . After completed, the message from plan states "available without authorization". Spoke with an [facility] [employer] Medicare representative at [phone] regarding this authorization. Representative states the patient updated her information in their system and there is already an authorization of file for this dose that is effective until 10/20/2019 which is why I received this response on [email or web address]. She performed a trial run on the prescription which went through as "paid". No further action needed.

## 2018-10-22 DIAGNOSIS — M659 Synovitis and tenosynovitis, unspecified: Secondary | ICD-10-CM | POA: Diagnosis not present

## 2018-10-22 DIAGNOSIS — J449 Chronic obstructive pulmonary disease, unspecified: Secondary | ICD-10-CM | POA: Diagnosis not present

## 2018-10-22 DIAGNOSIS — I4891 Unspecified atrial fibrillation: Secondary | ICD-10-CM | POA: Diagnosis not present

## 2018-10-22 DIAGNOSIS — M6588 Other synovitis and tenosynovitis, other site: Secondary | ICD-10-CM | POA: Diagnosis not present

## 2018-10-22 DIAGNOSIS — F329 Major depressive disorder, single episode, unspecified: Secondary | ICD-10-CM | POA: Diagnosis not present

## 2018-10-22 DIAGNOSIS — Z79899 Other long term (current) drug therapy: Secondary | ICD-10-CM | POA: Diagnosis not present

## 2018-10-22 DIAGNOSIS — I10 Essential (primary) hypertension: Secondary | ICD-10-CM | POA: Diagnosis not present

## 2018-10-22 DIAGNOSIS — S83242A Other tear of medial meniscus, current injury, left knee, initial encounter: Secondary | ICD-10-CM | POA: Diagnosis not present

## 2018-10-22 DIAGNOSIS — F419 Anxiety disorder, unspecified: Secondary | ICD-10-CM | POA: Diagnosis not present

## 2018-10-22 DIAGNOSIS — F17210 Nicotine dependence, cigarettes, uncomplicated: Secondary | ICD-10-CM | POA: Diagnosis not present

## 2018-10-22 DIAGNOSIS — S83272A Complex tear of lateral meniscus, current injury, left knee, initial encounter: Secondary | ICD-10-CM | POA: Diagnosis not present

## 2018-10-22 DIAGNOSIS — Z7951 Long term (current) use of inhaled steroids: Secondary | ICD-10-CM | POA: Diagnosis not present

## 2018-10-22 DIAGNOSIS — E7800 Pure hypercholesterolemia, unspecified: Secondary | ICD-10-CM | POA: Diagnosis not present

## 2018-10-22 DIAGNOSIS — K219 Gastro-esophageal reflux disease without esophagitis: Secondary | ICD-10-CM | POA: Diagnosis not present

## 2018-10-22 DIAGNOSIS — M1712 Unilateral primary osteoarthritis, left knee: Secondary | ICD-10-CM | POA: Diagnosis not present

## 2018-10-22 DIAGNOSIS — M25562 Pain in left knee: Secondary | ICD-10-CM | POA: Diagnosis not present

## 2018-10-22 DIAGNOSIS — I2510 Atherosclerotic heart disease of native coronary artery without angina pectoris: Secondary | ICD-10-CM | POA: Diagnosis not present

## 2018-10-22 DIAGNOSIS — S83282A Other tear of lateral meniscus, current injury, left knee, initial encounter: Secondary | ICD-10-CM | POA: Diagnosis not present

## 2018-10-22 DIAGNOSIS — Z7901 Long term (current) use of anticoagulants: Secondary | ICD-10-CM | POA: Diagnosis not present

## 2018-10-26 DIAGNOSIS — M6281 Muscle weakness (generalized): Secondary | ICD-10-CM | POA: Diagnosis not present

## 2018-10-26 DIAGNOSIS — M25462 Effusion, left knee: Secondary | ICD-10-CM | POA: Diagnosis not present

## 2018-10-28 DIAGNOSIS — M25462 Effusion, left knee: Secondary | ICD-10-CM | POA: Diagnosis not present

## 2018-10-28 DIAGNOSIS — M6281 Muscle weakness (generalized): Secondary | ICD-10-CM | POA: Diagnosis not present

## 2018-11-03 DIAGNOSIS — M25462 Effusion, left knee: Secondary | ICD-10-CM | POA: Diagnosis not present

## 2018-11-03 DIAGNOSIS — M6281 Muscle weakness (generalized): Secondary | ICD-10-CM | POA: Diagnosis not present

## 2018-11-04 DIAGNOSIS — M25462 Effusion, left knee: Secondary | ICD-10-CM | POA: Diagnosis not present

## 2018-11-04 DIAGNOSIS — M6281 Muscle weakness (generalized): Secondary | ICD-10-CM | POA: Diagnosis not present

## 2018-11-11 DIAGNOSIS — M25462 Effusion, left knee: Secondary | ICD-10-CM | POA: Diagnosis not present

## 2018-11-11 DIAGNOSIS — M6281 Muscle weakness (generalized): Secondary | ICD-10-CM | POA: Diagnosis not present

## 2018-11-16 DIAGNOSIS — M6281 Muscle weakness (generalized): Secondary | ICD-10-CM | POA: Diagnosis not present

## 2018-11-16 DIAGNOSIS — M25462 Effusion, left knee: Secondary | ICD-10-CM | POA: Diagnosis not present

## 2018-11-30 DIAGNOSIS — M6281 Muscle weakness (generalized): Secondary | ICD-10-CM | POA: Diagnosis not present

## 2018-11-30 DIAGNOSIS — M25462 Effusion, left knee: Secondary | ICD-10-CM | POA: Diagnosis not present

## 2018-12-24 ENCOUNTER — Other Ambulatory Visit: Payer: Self-pay

## 2018-12-24 DIAGNOSIS — I5042 Chronic combined systolic (congestive) and diastolic (congestive) heart failure: Secondary | ICD-10-CM

## 2018-12-24 MED ORDER — SACUBITRIL-VALSARTAN 97-103 MG PO TABS: 1.0000 | ORAL_TABLET | Freq: Two times a day (BID) | ORAL | 3 refills | Status: DC

## 2018-12-30 DIAGNOSIS — L821 Other seborrheic keratosis: Secondary | ICD-10-CM | POA: Diagnosis not present

## 2018-12-30 DIAGNOSIS — L820 Inflamed seborrheic keratosis: Secondary | ICD-10-CM | POA: Diagnosis not present

## 2018-12-30 DIAGNOSIS — L578 Other skin changes due to chronic exposure to nonionizing radiation: Secondary | ICD-10-CM | POA: Diagnosis not present

## 2019-01-03 ENCOUNTER — Other Ambulatory Visit: Payer: Self-pay | Admitting: Cardiology

## 2019-01-03 DIAGNOSIS — I5043 Acute on chronic combined systolic (congestive) and diastolic (congestive) heart failure: Secondary | ICD-10-CM

## 2019-01-03 DIAGNOSIS — E785 Hyperlipidemia, unspecified: Secondary | ICD-10-CM

## 2019-01-07 ENCOUNTER — Other Ambulatory Visit: Payer: Self-pay

## 2019-01-07 DIAGNOSIS — I5042 Chronic combined systolic (congestive) and diastolic (congestive) heart failure: Secondary | ICD-10-CM

## 2019-01-07 MED ORDER — SACUBITRIL-VALSARTAN 97-103 MG PO TABS: 1.0000 | ORAL_TABLET | Freq: Two times a day (BID) | ORAL | 3 refills | Status: DC

## 2019-01-26 DIAGNOSIS — F17210 Nicotine dependence, cigarettes, uncomplicated: Secondary | ICD-10-CM | POA: Diagnosis not present

## 2019-01-26 DIAGNOSIS — J40 Bronchitis, not specified as acute or chronic: Secondary | ICD-10-CM | POA: Diagnosis not present

## 2019-01-26 DIAGNOSIS — J449 Chronic obstructive pulmonary disease, unspecified: Secondary | ICD-10-CM | POA: Diagnosis not present

## 2019-01-31 ENCOUNTER — Telehealth: Payer: Self-pay

## 2019-01-31 ENCOUNTER — Other Ambulatory Visit: Payer: Self-pay | Admitting: Cardiology

## 2019-01-31 DIAGNOSIS — I5043 Acute on chronic combined systolic (congestive) and diastolic (congestive) heart failure: Secondary | ICD-10-CM

## 2019-01-31 DIAGNOSIS — I5042 Chronic combined systolic (congestive) and diastolic (congestive) heart failure: Secondary | ICD-10-CM

## 2019-01-31 MED ORDER — SACUBITRIL-VALSARTAN 97-103 MG PO TABS: 1.0000 | ORAL_TABLET | Freq: Two times a day (BID) | ORAL | 1 refills | Status: DC

## 2019-01-31 MED ORDER — SACUBITRIL-VALSARTAN 97-103 MG PO TABS: 1.0000 | ORAL_TABLET | Freq: Two times a day (BID) | ORAL | 0 refills | Status: DC

## 2019-01-31 NOTE — Telephone Encounter (Signed)
°*  STAT* PHARMACY CHANGE  1. Which medications need to be refilled? (please list name of each medication and dose if known) Entresto  2. Which pharmacy/location (including street and city if local pharmacy) is medication to be sent to? [facility] [place]  3. Do they need a 30 day or 90 day supply? 90 day  PATIENT WAS TOLD THIS WAS GOING TO BE SENT TO PHARMACY 2 WEEKS AGO BUT WASN'T. PLEASE ALSO SEND 7 DAYS TO HER LOCAL PHARMACY TO GET HER THROUGH UNTIL HER ORDER FROM [facility] CAN GET TO HER.  [facility] LOCAL PHARMACY IS  [facility] - [address] [place] - [address] [phone] (Phone) [phone] (Fax)

## 2019-01-31 NOTE — Telephone Encounter (Signed)
Phoned [employer] in [place] and [facility], occupational in [place] to verify that electronically sent Entresto refills electronically had been received. Both pharmacies verified receipt of refill orders. [facility] informed me that patient unknowingly had 3 30 day refills there, so they gave her a 30 day refill and cancelled our 7 day refill.   Called and informed patient of above. She has no further questions or concerns.

## 2019-01-31 NOTE — Telephone Encounter (Signed)
7 day entresto refill sent to [facility] in [place], 90 days plus 1 refill sent to [facility] per patient preference

## 2019-02-07 ENCOUNTER — Telehealth: Payer: Self-pay | Admitting: *Deleted

## 2019-02-07 NOTE — Telephone Encounter (Signed)
Patient is returning your call.  

## 2019-02-07 NOTE — Telephone Encounter (Signed)
Called patient to let them know due to recent COVID19 CDC and Health Department Protocols, we are not seeing patients in the office. We are instead seeing if they would like to schedule this appointment as a Geographical information systems officer or Laptop. Unable to reach patient.   VM not set up.

## 2019-02-07 NOTE — Telephone Encounter (Signed)
Virtual Visit Pre-Appointment Phone Call  Steps For Call:  1. Confirm consent - "In the setting of the current Covid19 crisis, you are scheduled for a (phone or video) visit with your provider on (date) at (time).  Just as we do with many in-office visits, in order for you to participate in this visit, we must obtain consent.  If you'd like, I can send this to your mychart (if signed up) or email for you to review.  Otherwise, I can obtain your verbal consent now.  All virtual visits are billed to your insurance company just like a normal visit would be.  By agreeing to a virtual visit, we'd like you to understand that the technology does not allow for your provider to perform an examination, and thus may limit your provider's ability to fully assess your condition. If your provider identifies any concerns that need to be evaluated in person, we will make arrangements to do so.  Finally, though the technology is pretty good, we cannot assure that it will always work on either your or our end, and in the setting of a video visit, we may have to convert it to a phone-only visit.  In either situation, we cannot ensure that we have a secure connection.  Are you willing to proceed?" STAFF: Did the patient verbally acknowledge consent to telehealth visit? Document YES/NO here: YES  2. Confirm the BEST phone number to call the day of the visit by including in appointment notes  3. Give patient instructions for MyChart download to smartphone OR Doximity/Doxy.me as below if video visit (depending on what platform provider is using)  4. Confirm that appointment type is correct in Epic appointment notes (VIDEO vs PHONE)  5. Advise patient to be prepared with their blood pressure, heart rate, weight, any heart rhythm information, their current medicines, and a piece of paper and pen handy for any instructions they may receive the day of their visit  6. Inform patient they will receive a phone call 15 minutes  prior to their appointment time (may be from unknown caller ID) so they should be prepared to answer    TELEPHONE CALL NOTE  [name] has been deemed a candidate for a follow-up tele-health visit to limit community exposure during the Covid-19 pandemic. I spoke with the patient via phone to ensure availability of phone/video source, confirm preferred email & phone number, and discuss instructions and expectations.  I reminded Jennifer Martin to be prepared with any vital sign and/or heart rhythm information that could potentially be obtained via home monitoring, at the time of her visit. I reminded [name] to expect a phone call prior to her visit.  Jennifer Martin 02/07/2019 5:02 PM   INSTRUCTIONS FOR DOWNLOADING THE MYCHART APP TO SMARTPHONE  - The patient must first make sure to have activated MyChart and know their login information - If Apple, go to [employer] and type in MyChart in the search bar and download the app. If Android, ask patient to go to [employer] and type in [place] in the search bar and download the app. The app is free but as with any other app downloads, their phone may require them to verify saved payment information or Apple/Android password.  - The patient will need to then log into the app with their MyChart username and password, and select [place] as their healthcare provider to link the account. When it is time for your visit, go  to the MyChart app, find appointments, and click Begin Video Visit. Be sure to Select Allow for your device to access the Microphone and Camera for your visit. You will then be connected, and your provider will be with you shortly.  **If they have any issues connecting, or need assistance please contact MyChart service desk [phone])**  **If using a computer, in order to ensure the best quality for their visit they will need to use either of the following Internet Browsers: [employer], or Google  Chrome**  IF USING DOXIMITY or DOXY.ME - The patient will receive a link just prior to their visit by text.     FULL LENGTH CONSENT FOR TELE-HEALTH VISIT   I hereby voluntarily request, consent and authorize [facility] and its employed or contracted physicians, physician assistants, nurse practitioners or other licensed health care professionals (the Practitioner), to provide me with telemedicine health care services (the "Services") as deemed necessary by the treating Practitioner. I acknowledge and consent to receive the Services by the Practitioner via telemedicine. I understand that the telemedicine visit will involve communicating with the Practitioner through live audiovisual communication technology and the disclosure of certain medical information by electronic transmission. I acknowledge that I have been given the opportunity to request an in-person assessment or other available alternative prior to the telemedicine visit and am voluntarily participating in the telemedicine visit.  I understand that I have the right to withhold or withdraw my consent to the use of telemedicine in the course of my care at any time, without affecting my right to future care or treatment, and that the Practitioner or I may terminate the telemedicine visit at any time. I understand that I have the right to inspect all information obtained and/or recorded in the course of the telemedicine visit and may receive copies of available information for a reasonable fee.  I understand that some of the potential risks of receiving the Services via telemedicine include:  Marland Kitchen. Delay or interruption in medical evaluation due to technological equipment failure or disruption; . Information transmitted may not be sufficient (e.g. poor resolution of images) to allow for appropriate medical decision making by the Practitioner; and/or  . In rare instances, security protocols could fail, causing a breach of personal health  information.  Furthermore, I acknowledge that it is my responsibility to provide information about my medical history, conditions and care that is complete and accurate to the best of my ability. I acknowledge that Practitioner's advice, recommendations, and/or decision may be based on factors not within their control, such as incomplete or inaccurate data provided by me or distortions of diagnostic images or specimens that may result from electronic transmissions. I understand that the practice of medicine is not an exact science and that Practitioner makes no warranties or guarantees regarding treatment outcomes. I acknowledge that I will receive a copy of this consent concurrently upon execution via email to the email address I last provided but may also request a printed copy by calling the office of [facility].    I understand that my insurance will be billed for this visit.   I have read or had this consent read to me. . I understand the contents of this consent, which adequately explains the benefits and risks of the Services being provided via telemedicine.  . I have been provided ample opportunity to ask questions regarding this consent and the Services and have had my questions answered to my satisfaction. . I give my informed consent  for the services to be provided through the use of telemedicine in my medical care  By participating in this telemedicine visit I agree to the above. Verbal consent given

## 2019-02-14 ENCOUNTER — Encounter: Payer: Self-pay | Admitting: Cardiology

## 2019-02-14 ENCOUNTER — Telehealth (INDEPENDENT_AMBULATORY_CARE_PROVIDER_SITE_OTHER): Payer: PPO | Admitting: Cardiology

## 2019-02-14 ENCOUNTER — Other Ambulatory Visit: Payer: Self-pay

## 2019-02-14 DIAGNOSIS — I480 Paroxysmal atrial fibrillation: Secondary | ICD-10-CM | POA: Diagnosis not present

## 2019-02-14 NOTE — Progress Notes (Signed)
Electrophysiology TeleHealth Note   Due to national recommendations of social distancing due to COVID 19, an audio/video telehealth visit is felt to be most appropriate for this patient at this time.  See Epic message for the patient's consent to telehealth for [facility].   Date:  02/14/2019   ID:  Jennifer Martin, DOB [birth year], MRN [id number]  Location: patient's home  Provider location: [address] [place]  Evaluation Performed: Follow-up visit  PCP:  [name]., MD  Cardiologist:  [name], MD  Electrophysiologist:  Dr Elberta Fortis  Chief Complaint:  Atrial fibrillation  History of Present Illness:    Jennifer Martin is a 63 y.o. female who presents via audio/video conferencing for a telehealth visit today.  Since last being seen in our clinic, the patient reports doing very well.  Today, she denies symptoms of palpitations, chest pain, shortness of breath,  lower extremity edema, dizziness, presyncope, or syncope.  The patient is otherwise without complaint today.  The patient denies symptoms of fevers, chills, cough, or new SOB worrisome for COVID 19.  HIstory of CAD, HTN, HLD, And atrial fibrillation/atrial flutter.  She had an AF ablation 05/29/2017.  Today, denies symptoms of palpitations, chest pain, shortness of breath, orthopnea, PND, lower extremity edema, claudication, dizziness, presyncope, syncope, bleeding, or neurologic sequela. The patient is tolerating medications without difficulties.  She is currently feeling well.  She has no chest pain or shortness of breath.  She is able to do all of her daily activities.  She has gained some weight that she feels is due to eating junk food.  She does note almost daily palpitations, but she does feel that this is due to anxiety.  She takes Ativan and this greatly improves her symptoms.  Past Medical History:  Diagnosis Date  . Anxiety   . Atherosclerosis of coronary artery of native heart with angina  pectoris (HCC)   . Atrial flutter (HCC)   . CAD (coronary artery disease)   . CHF (congestive heart failure) (HCC)   . COPD (chronic obstructive pulmonary disease) (HCC)   . Demand ischemia (HCC)   . Depression   . Difficult intubation    " small airway and need a little tube "  . Diverticulosis   . GERD (gastroesophageal reflux disease)   . Hyperlipidemia   . Hypertension   . Inappropriate ADH syndrome (HCC)   . LBBB (left bundle branch block)   . Migraine    "none since ~ 2012; mild ones then when I did have them because of the beta blockers I was on" (06/09/2017)  . Myocardial infarction (HCC)    "I've had light ones" (06/09/2017)  . On amiodarone therapy 04/13/2017  . Osteoarthritis   . Pneumonia    "couple times" (06/09/2017)    Past Surgical History:  Procedure Laterality Date  . ATRIAL FIBRILLATION ABLATION N/A 05/29/2017   Procedure: Atrial Fibrillation Ablation;  Surgeon: Regan Lemming, MD;  Location[facility] INVASIVE CV LAB;  Service: Cardiovascular;  Laterality: N/A;  . BREAST SURGERY Left    "took a gland out; milk duct"  . CARDIAC CATHETERIZATION  2008   has had 2 procedures, the last one approx 2008, never had PCI/stent. Dr [name]  . CARPAL TUNNEL RELEASE Left   . DILATION AND CURETTAGE OF UTERUS     "related to heavy bleeding"  . INGUINAL HERNIA REPAIR Left   . LACRIMAL DUCT EXPLORATION Right 04/01/2018   Procedure: LACRIMAL DUCT  EXPLORATION AND ETHMOIDECTOMY;  Surgeon: Floydene Flock, MD;  Location: [facility] OR;  Service: Ophthalmology;  Laterality: Right;  . SHOULDER ARTHROSCOPY WITH ROTATOR CUFF REPAIR Right   . TEAR DUCT PROBING Right 04/01/2018   Procedure: TEAR DUCT PROBING WITH STENT;  Surgeon: Floydene Flock, MD;  Location: [facility] OR;  Service: Ophthalmology;  Laterality: Right;  . TRACHEOSTOMY  2006   "closed on it's own"  . TUBAL LIGATION    . VAGINAL HYSTERECTOMY     "fibroids"    Current Outpatient Medications  Medication Sig Dispense Refill  .  acetaminophen (TYLENOL) 500 MG tablet Take 1,000 mg by mouth 2 (two) times daily as needed for mild pain or headache.    . albuterol (PROVENTIL HFA;VENTOLIN HFA) 108 (90 Base) MCG/ACT inhaler Inhale 2 puffs into the lungs every 6 (six) hours as needed for wheezing or shortness of breath.    . budesonide-formoterol (SYMBICORT) 160-4.5 MCG/ACT inhaler Inhale 2 puffs into the lungs 2 (two) times daily as needed.     . Calcium Carbonate-Vitamin D (CALCIUM 600+D PO) Take 2 tablets by mouth daily.    Marland Kitchen escitalopram (LEXAPRO) 10 MG tablet Take 10 mg by mouth daily.     . fluticasone (FLONASE) 50 MCG/ACT nasal spray Place 2 sprays into both nostrils daily.     . furosemide (LASIX) 20 MG tablet Take 1 tablet (20 mg total) by mouth 2 (two) times daily. Take only one daily if you weigh 124l bs or less 60 tablet 5  . LORazepam (ATIVAN) 1 MG tablet Take 1 mg by mouth 3 (three) times daily as needed for anxiety.     . nitroGLYCERIN (NITROSTAT) 0.4 MG SL tablet Place 1 tablet (0.4 mg total) under the tongue every 5 (five) minutes as needed for chest pain. 25 tablet 11  . omega-3 acid ethyl esters (LOVAZA) 1 g capsule Take 2 g by mouth daily.    . pantoprazole (PROTONIX) 40 MG tablet Take 40 mg by mouth daily.    . potassium chloride SA (K-DUR,KLOR-CON) 20 MEQ tablet TAKE 1/2 TABLET BY MOUTH TWICE DAILY 45 tablet 2  . pravastatin (PRAVACHOL) 20 MG tablet Take 1 tablet (20 mg total) by mouth every evening. 90 tablet 3  . rivaroxaban (XARELTO) 20 MG TABS tablet Take 1 tablet (20 mg total) daily by mouth. 90 tablet 3  . sacubitril-valsartan (ENTRESTO) 97-103 MG Take 1 tablet by mouth 2 (two) times daily. 180 tablet 1   No current facility-administered medications for this visit.     Allergies:   Atorvastatin; Cefdinir; Levofloxacin; and Morphine   Social History:  The patient  reports that she has been smoking cigarettes and e-cigarettes. She has a 17.00 pack-year smoking history. She has never used smokeless  tobacco. She reports that she does not drink alcohol or use drugs.   Family History:  The patient's  family history includes CAD in her brother; Heart disease in her brother.   ROS:  Please see the history of present illness.   All other systems are personally reviewed and negative.    Exam:    Vital Signs:  BP 120/78   Pulse 70   Wt 130 lb (59 kg)   BMI 25.39 kg/m   Well appearing, alert and conversant, regular work of breathing,  good skin color Eyes- anicteric, neuro- grossly intact, skin- no apparent rash or lesions or cyanosis, mouth- oral mucosa is pink   Labs/Other Tests and Data Reviewed:    Recent Labs: 04/01/2018: Hemoglobin  11.6; Platelets 268 04/19/2018: ALT 14; BUN 13; Creatinine, Ser 0.54; NT-Pro BNP 177; Potassium 4.7; Sodium 142   Wt Readings from Last 3 Encounters:  02/14/19 130 lb (59 kg)  10/01/18 126 lb (57.2 kg)  06/23/18 130 lb (59 kg)     Other studies personally reviewed: Additional studies/ records that were reviewed today include: TTE 12/15/17  Review of the above records today demonstrates:   - Left ventricle: The cavity size was normal. Wall thickness was   increased in a pattern of mild LVH. Systolic function was normal.   Wall motion was normal; there were no regional wall motion   abnormalities. Doppler parameters are consistent with abnormal   left ventricular relaxation (grade 1 diastolic dysfunction). - Aortic valve: There was mild regurgitation. Valve area (VTI):   3.03 cm^2. Valve area (Vmax): 2.51 cm^2. Valve area (Vmean): 2.51   cm^2. - Mitral valve: There was mild regurgitation. - Left atrium: The atrium was moderately dilated.  ECG 10/01/2018 personally reviewed Sinus rhythm, left bundle branch block, PAC  ASSESSMENT & PLAN:    1.  Atrial fibrillation/flutter: Currently on Xarelto.  Status post ablation 06/08/2017.  She is continued to have some palpitations that have gotten worse over the last month.  This does coincide with the  coronavirus pandemic.  She takes Ativan and her palpitations improved.  If they continue after the coronavirus pandemic, will likely fit her with a cardiac monitor.  This patients CHA2DS2-VASc Score and unadjusted Ischemic Stroke Rate (% per year) is equal to 3.2 % stroke rate/year from a score of 3  Above score calculated as 1 point each if present [CHF, HTN, DM, Vascular=MI/PAD/Aortic Plaque, Age if 65-74, or Female] Above score calculated as 2 points each if present [Age > 75, or Stroke/TIA/TE]  2.  Hypertension: Currently well controlled.  No changes.  3.  Coronary artery disease: No current chest pain  4.  Hyperlipidemia: Continue statin   COVID 19 screen The patient denies symptoms of COVID 19 at this time.  The importance of social distancing was discussed today.  Follow-up: 6 months  Current medicines are reviewed at length with the patient today.   The patient does not have concerns regarding her medicines.  The following changes were made today:  none  Labs/ tests ordered today include:  No orders of the defined types were placed in this encounter.    Patient Risk:  after full review of this patients clinical status, I feel that they are at moderate risk at this time.  Today, I have spent 12 minutes with the patient with telehealth technology discussing atrial fibrillation, coronavirus.    Signed, Will [name], MD  02/14/2019 10:10 AM     [facility] [address] [place] [address] [phone] (office) [phone] (fax)

## 2019-02-17 ENCOUNTER — Encounter: Payer: Self-pay | Admitting: Cardiology

## 2019-02-17 ENCOUNTER — Other Ambulatory Visit: Payer: Self-pay

## 2019-02-17 ENCOUNTER — Telehealth (INDEPENDENT_AMBULATORY_CARE_PROVIDER_SITE_OTHER): Payer: PPO | Admitting: Cardiology

## 2019-02-17 VITALS — BP 139/86 | HR 80 | Ht 60.0 in | Wt 128.0 lb

## 2019-02-17 DIAGNOSIS — I25119 Atherosclerotic heart disease of native coronary artery with unspecified angina pectoris: Secondary | ICD-10-CM

## 2019-02-17 DIAGNOSIS — Z7901 Long term (current) use of anticoagulants: Secondary | ICD-10-CM

## 2019-02-17 DIAGNOSIS — I480 Paroxysmal atrial fibrillation: Secondary | ICD-10-CM | POA: Diagnosis not present

## 2019-02-17 DIAGNOSIS — E782 Mixed hyperlipidemia: Secondary | ICD-10-CM | POA: Diagnosis not present

## 2019-02-17 DIAGNOSIS — I5042 Chronic combined systolic (congestive) and diastolic (congestive) heart failure: Secondary | ICD-10-CM

## 2019-02-17 DIAGNOSIS — Z7189 Other specified counseling: Secondary | ICD-10-CM

## 2019-02-17 DIAGNOSIS — J449 Chronic obstructive pulmonary disease, unspecified: Secondary | ICD-10-CM

## 2019-02-17 DIAGNOSIS — I5043 Acute on chronic combined systolic (congestive) and diastolic (congestive) heart failure: Secondary | ICD-10-CM | POA: Diagnosis not present

## 2019-02-17 MED ORDER — FUROSEMIDE 20 MG PO TABS: 20.0000 mg | ORAL_TABLET | Freq: Two times a day (BID) | ORAL | 0 refills | Status: DC

## 2019-02-17 NOTE — Patient Instructions (Addendum)
Medication Instructions:  Your physician recommends that you continue on your current medications as directed. Please refer to the Current Medication list given to you today.  If you need a refill on your cardiac medications before your next appointment, please call your pharmacy.   Lab work: Your physician recommends that you return for lab work within the next 1-2 days: CMP, lipid profile, ProBNP. Please go to the [place] office, no appointment needed. Please fast beforehand.    If you have labs (blood work) drawn today and your tests are completely normal, you will receive your results only by: Marland Kitchen MyChart Message (if you have MyChart) OR . A paper copy in the mail If you have any lab test that is abnormal or we need to change your treatment, we will call you to review the results.  Testing/Procedures: None  Follow-Up: At [facility], you and your health needs are our priority.  As part of our continuing mission to provide you with exceptional heart care, we have created designated Provider Care Teams.  These Care Teams include your primary Cardiologist (physician) and Advanced Practice Providers (APPs -  Physician Assistants and Nurse Practitioners) who all work together to provide you with the care you need, when you need it. You will need a follow up virtual appointment in 6 weeks: Thursday, 03/24/2019, at 11:00 am.       Heart Failure  Weigh yourself every morning when you first wake up and record on a calender or note pad, bring this to your office visits. Using a pill tender can help with taking your medications consistently.  Limit your fluid intake to 2 liters daily  Limit your sodium intake to less than 2-3 grams daily. Ask if you need dietary teaching.  If you gain more than 3 pounds (from your dry weight ), double your dose of diuretic for the day.  If you gain more than 5 pounds (from your dry weight), double your dose of lasix and call your heart failure doctor.   Please do not smoke tobacco since it is very bad for your heart.  Please do not drink alcohol since it can worsen your heart failure.Also avoid OTC nonsteroidal drugs, such as advil, aleve and motrin.  Try to exercise for at least 30 minutes every day because this will help your heart be more efficient. You may be eligible for supervised cardiac rehab, ask your physician.

## 2019-02-17 NOTE — Progress Notes (Signed)
Virtual Visit via Telephone Note   This visit type was conducted due to national recommendations for restrictions regarding the COVID-19 Pandemic (e.g. social distancing) in an effort to limit this patient's exposure and mitigate transmission in our community.  Due to her co-morbid illnesses, this patient is at least at moderate risk for complications without adequate follow up.  This format is felt to be most appropriate for this patient at this time.  The patient did not have access to video technology/had technical difficulties with video requiring transitioning to audio format only (telephone).  All issues noted in this document were discussed and addressed.  No physical exam could be performed with this format.  Please refer to the patient's chart for her  consent to telehealth for [facility].   Evaluation Performed:  Follow-up visit  Date:  02/17/2019   ID:  Champayne, Kocian 1955-10-25, MRN [id number]  Patient Location: Home Provider Location: Home  PCP:  [name]., MD  Cardiologist:  [name], MD  Electrophysiologist:  Regan Lemming, MD   Chief Complaint:  Heart failure and PAF  History of Present Illness:    Jennifer Martin is a 63 y.o. female with a hx of CAD, Heart failure with EF normalized with ARNI ,Dyslipidemia, HTN, PAF, COPD  and  atrial flutter status post atrial fibrillation/flutter EP ablation 05/29/17  who was last seen by me  10/01/18.  Her last echocardiogram 12/15/2017 shows mild LVH normal left ventricular systolic function and mild mitral and aortic regurgitation Last myocardial perfusion study was performed 01/26/2017 showing no ischemia and the ejection fraction at that time 61%. She is followed by electrophysiology and has had a durable response to catheter ablation with no clinical recurrence of atrial fibrillation or flutter. She remains anticoagulated with rivaroxaban.  The patient does not have symptoms concerning for COVID-19  infection (fever, chills, cough, or new shortness of breath).   Lately she has not done well she gained 10 pounds associated with prednisone and is now in between this and her ideal weight and will increase the dose of her diuretic.  She has no edema orthopnea shortness of breath chest pain or syncope.  She been very emotionally distressed was watched too much of [employer] news and [employer] was having palpitation and since she changed the symptoms have resolved.  Her heart rates have not exceeded 100 bpm at home.  No bleeding complication for her anticoagulant.  She is overdue for labs and I will have her come to the office to check CMP proBNP lipid profile and I will plan to see her in the office face-to-face in 4 to 6 weeks. Past Medical History:  Diagnosis Date  . Anxiety   . Atherosclerosis of coronary artery of native heart with angina pectoris (HCC)   . Atrial flutter (HCC)   . CAD (coronary artery disease)   . CHF (congestive heart failure) (HCC)   . COPD (chronic obstructive pulmonary disease) (HCC)   . Demand ischemia (HCC)   . Depression   . Difficult intubation    " small airway and need a little tube "  . Diverticulosis   . GERD (gastroesophageal reflux disease)   . Hyperlipidemia   . Hypertension   . Inappropriate ADH syndrome (HCC)   . LBBB (left bundle branch block)   . Migraine    "none since ~ 2012; mild ones then when I did have them because of the beta blockers I was on" (06/09/2017)  . Myocardial  infarction (HCC)    "I've had light ones" (06/09/2017)  . On amiodarone therapy 04/13/2017  . Osteoarthritis   . Pneumonia    "couple times" (06/09/2017)   Past Surgical History:  Procedure Laterality Date  . ATRIAL FIBRILLATION ABLATION N/A 05/29/2017   Procedure: Atrial Fibrillation Ablation;  Surgeon: Regan Lemming, MD;  Location: [facility] INVASIVE CV LAB;  Service: Cardiovascular;  Laterality: N/A;  . BREAST SURGERY Left    "took a gland out; milk duct"  . CARDIAC CATHETERIZATION   2008   has had 2 procedures, the last one approx 2008, never had PCI/stent. Dr [name]  . CARPAL TUNNEL RELEASE Left   . DILATION AND CURETTAGE OF UTERUS     "related to heavy bleeding"  . INGUINAL HERNIA REPAIR Left   . LACRIMAL DUCT EXPLORATION Right 04/01/2018   Procedure: LACRIMAL DUCT EXPLORATION AND ETHMOIDECTOMY;  Surgeon: Floydene Flock, MD;  Location: [facility] OR;  Service: Ophthalmology;  Laterality: Right;  . SHOULDER ARTHROSCOPY WITH ROTATOR CUFF REPAIR Right   . TEAR DUCT PROBING Right 04/01/2018   Procedure: TEAR DUCT PROBING WITH STENT;  Surgeon: Floydene Flock, MD;  Location: [facility] OR;  Service: Ophthalmology;  Laterality: Right;  . TRACHEOSTOMY  2006   "closed on it's own"  . TUBAL LIGATION    . VAGINAL HYSTERECTOMY     "fibroids"     Current Meds  Medication Sig  . acetaminophen (TYLENOL) 500 MG tablet Take 1,000 mg by mouth 2 (two) times daily as needed for mild pain or headache.  . albuterol (PROVENTIL HFA;VENTOLIN HFA) 108 (90 Base) MCG/ACT inhaler Inhale 2 puffs into the lungs every 6 (six) hours as needed for wheezing or shortness of breath.  . budesonide-formoterol (SYMBICORT) 160-4.5 MCG/ACT inhaler Inhale 2 puffs into the lungs 2 (two) times daily as needed.   . Calcium Carbonate-Vitamin D (CALCIUM 600+D PO) Take 1 tablet by mouth daily.   Marland Kitchen escitalopram (LEXAPRO) 10 MG tablet Take 10 mg by mouth daily.   . fluticasone (FLONASE) 50 MCG/ACT nasal spray Place 2 sprays into both nostrils daily.   . furosemide (LASIX) 20 MG tablet Take 1 tablet (20 mg total) by mouth 2 (two) times daily. Take only one daily if you weigh 124l bs or less  . LORazepam (ATIVAN) 1 MG tablet Take 1 mg by mouth 3 (three) times daily as needed for anxiety.   . nitroGLYCERIN (NITROSTAT) 0.4 MG SL tablet Place 1 tablet (0.4 mg total) under the tongue every 5 (five) minutes as needed for chest pain.  . pantoprazole (PROTONIX) 40 MG tablet Take 40 mg by mouth daily.  . potassium chloride SA  (K-DUR,KLOR-CON) 20 MEQ tablet TAKE 1/2 TABLET BY MOUTH TWICE DAILY  . pravastatin (PRAVACHOL) 20 MG tablet Take 1 tablet (20 mg total) by mouth every evening.  . rivaroxaban (XARELTO) 20 MG TABS tablet Take 1 tablet (20 mg total) daily by mouth.  . sacubitril-valsartan (ENTRESTO) 97-103 MG Take 1 tablet by mouth 2 (two) times daily.     Allergies:   Atorvastatin; Cefdinir; Levofloxacin; and Morphine   Social History   Tobacco Use  . Smoking status: Current Every Day Smoker    Packs/day: 0.50    Years: 34.00    Pack years: 17.00    Types: Cigarettes, E-cigarettes  . Smokeless tobacco: Never Used  Substance Use Topics  . Alcohol use: No  . Drug use: No     Family Hx: The patient's family history includes CAD in her brother;  Heart disease in her brother.  ROS:   Please see the history of present illness.     All other systems reviewed and are negative.   Prior CV studies:   The following studies were reviewed today:    Labs/Other Tests and Data Reviewed:    EKG:  An ECG dated 10/01/18 was personally reviewed today and demonstrated:  SRTH LBBB with repolarization changes  Recent Labs:  10/01/2018 creatinine 0.55 potassium 4.1 hemoglobin 13.3 07/15/2018 cholesterol 208 HDL 55 LDL 153  04/01/2018: Hemoglobin 11.6; Platelets 268 04/19/2018: ALT 14; BUN 13; Creatinine, Ser 0.54; NT-Pro BNP 177; Potassium 4.7; Sodium 142   Recent Lipid Panel Lab Results  Component Value Date/Time   CHOL 213 (H) 04/19/2018 12:01 PM   TRIG 135 04/19/2018 12:01 PM   HDL 71 04/19/2018 12:01 PM   CHOLHDL 3.0 04/19/2018 12:01 PM   CHOLHDL 3.2 06/11/2017 02:39 PM   LDLCALC 115 (H) 04/19/2018 12:01 PM    Wt Readings from Last 3 Encounters:  02/17/19 128 lb (58.1 kg)  02/14/19 130 lb (59 kg)  10/01/18 126 lb (57.2 kg)     Objective:    Vital Signs:  BP 139/86 (BP Location: Left Arm, Patient Position: Sitting)   Pulse 80   Ht 5' (1.524 m)   Wt 128 lb (58.1 kg)   SpO2 99%   BMI 25.00  kg/m    VITAL SIGNS:  reviewed PSYCH:  normal affect her Thought and cognition are normal she is alert and oriented x3 and has no audible wheezing or respiratory distress during conversation  ASSESSMENT & PLAN:    1. Coronary artery disease stable New York Heart Association class I continue current medical treatment. 2. Heart failure chronic combined systolic and diastolic mildly decompensated she will increase her diuretic to twice daily to her weight is less than 124 pounds. 3. Paroxysmal atrial fibrillation stable she will require an EKG at her office follow-up as well as continuin her current anticoagulant 4. Stable anticoagulation need to check renal function regarding dose continue Xarelto 5. Continue her statin on concern with her last LDL we will repeat her lipid profile of his greater than 100 will need combined therapy 6. COPD is stable continue current treatment 7. I reinforced with her the need for social distancing wearing a mask outdoors good handwashing technique strongly encouraged her to be assured and to stop watching network news that is been very disturbing and is precipitated cardiac symptoms in her case  COVID-19 Education: The signs and symptoms of COVID-19 were discussed with the patient and how to seek care for testing (follow up with PCP or arrange E-visit).  The importance of social distancing was discussed today.  Time:   Today, I have spent 24 minutes with the patient with telehealth technology discussing the above problems.     Medication Adjustments/Labs and Tests Ordered: Current medicines are reviewed at length with the patient today.  Concerns regarding medicines are outlined above.   Tests Ordered: No orders of the defined types were placed in this encounter.   Medication Changes: No orders of the defined types were placed in this encounter.   Disposition:  Follow up in 4 week(s)  Signed, [name], MD  02/17/2019 11:33 AM    [facility]

## 2019-02-18 ENCOUNTER — Telehealth: Payer: Self-pay | Admitting: *Deleted

## 2019-02-18 NOTE — Telephone Encounter (Signed)
Pt is on Entresto. [facility] faxed paper work to [facility]. They are giving pt 3 months free and is enrolled in program. Pt wanting to know if fax has gotten there. This new rx will be to a different pharmacy and pt wanted to let us know, not [facility] but the pharmacy that [facility] uses.

## 2019-02-21 ENCOUNTER — Other Ambulatory Visit: Payer: Self-pay

## 2019-02-21 ENCOUNTER — Other Ambulatory Visit: Payer: Self-pay | Admitting: Cardiology

## 2019-02-21 DIAGNOSIS — Z7901 Long term (current) use of anticoagulants: Secondary | ICD-10-CM | POA: Diagnosis not present

## 2019-02-21 DIAGNOSIS — I480 Paroxysmal atrial fibrillation: Secondary | ICD-10-CM | POA: Diagnosis not present

## 2019-02-21 DIAGNOSIS — E782 Mixed hyperlipidemia: Secondary | ICD-10-CM | POA: Diagnosis not present

## 2019-02-21 DIAGNOSIS — I5043 Acute on chronic combined systolic (congestive) and diastolic (congestive) heart failure: Secondary | ICD-10-CM | POA: Diagnosis not present

## 2019-02-21 DIAGNOSIS — I5042 Chronic combined systolic (congestive) and diastolic (congestive) heart failure: Secondary | ICD-10-CM

## 2019-02-21 DIAGNOSIS — I25119 Atherosclerotic heart disease of native coronary artery with unspecified angina pectoris: Secondary | ICD-10-CM | POA: Diagnosis not present

## 2019-02-21 MED ORDER — SACUBITRIL-VALSARTAN 97-103 MG PO TABS: 1.0000 | ORAL_TABLET | Freq: Two times a day (BID) | ORAL | 3 refills | Status: DC

## 2019-02-22 LAB — COMPREHENSIVE METABOLIC PANEL
ALT: 10 IU/L (ref 0–32)
AST: 13 IU/L (ref 0–40)
Albumin/Globulin Ratio: 2.1 (ref 1.2–2.2)
Albumin: 4.4 g/dL (ref 3.8–4.8)
Alkaline Phosphatase: 73 IU/L (ref 39–117)
BUN/Creatinine Ratio: 18 (ref 12–28)
BUN: 10 mg/dL (ref 8–27)
Bilirubin Total: 0.3 mg/dL (ref 0.0–1.2)
CO2: 23 mmol/L (ref 20–29)
Calcium: 9.1 mg/dL (ref 8.7–10.3)
Chloride: 93 mmol/L — ABNORMAL LOW (ref 96–106)
Creatinine, Ser: 0.57 mg/dL (ref 0.57–1.00)
GFR calc Af Amer: 113 mL/min/{1.73_m2} (ref >59)
GFR calc non Af Amer: 98 mL/min/{1.73_m2} (ref >59)
Globulin, Total: 2.1 g/dL (ref 1.5–4.5)
Glucose: 86 mg/dL (ref 65–99)
Potassium: 4.5 mmol/L (ref 3.5–5.2)
Sodium: 131 mmol/L — ABNORMAL LOW (ref 134–144)
Total Protein: 6.5 g/dL (ref 6.0–8.5)

## 2019-02-22 LAB — LIPID PANEL
Chol/HDL Ratio: 2.6 ratio (ref 0.0–4.4)
Cholesterol, Total: 166 mg/dL (ref 100–199)
HDL: 65 mg/dL (ref >39)
LDL Calculated: 84 mg/dL (ref 0–99)
Triglycerides: 86 mg/dL (ref 0–149)
VLDL Cholesterol Cal: 17 mg/dL (ref 5–40)

## 2019-02-22 LAB — PRO B NATRIURETIC PEPTIDE: NT-Pro BNP: 203 pg/mL (ref 0–287)

## 2019-03-21 DIAGNOSIS — I110 Hypertensive heart disease with heart failure: Secondary | ICD-10-CM | POA: Diagnosis not present

## 2019-03-21 DIAGNOSIS — Z79899 Other long term (current) drug therapy: Secondary | ICD-10-CM | POA: Diagnosis not present

## 2019-03-21 DIAGNOSIS — M1A09X0 Idiopathic chronic gout, multiple sites, without tophus (tophi): Secondary | ICD-10-CM | POA: Diagnosis not present

## 2019-03-21 DIAGNOSIS — I25118 Atherosclerotic heart disease of native coronary artery with other forms of angina pectoris: Secondary | ICD-10-CM | POA: Diagnosis not present

## 2019-03-21 DIAGNOSIS — E782 Mixed hyperlipidemia: Secondary | ICD-10-CM | POA: Diagnosis not present

## 2019-03-21 DIAGNOSIS — E222 Syndrome of inappropriate secretion of antidiuretic hormone: Secondary | ICD-10-CM | POA: Diagnosis not present

## 2019-03-21 DIAGNOSIS — I447 Left bundle-branch block, unspecified: Secondary | ICD-10-CM | POA: Diagnosis not present

## 2019-03-21 DIAGNOSIS — J441 Chronic obstructive pulmonary disease with (acute) exacerbation: Secondary | ICD-10-CM | POA: Diagnosis not present

## 2019-03-21 DIAGNOSIS — I5022 Chronic systolic (congestive) heart failure: Secondary | ICD-10-CM | POA: Diagnosis not present

## 2019-03-21 DIAGNOSIS — I482 Chronic atrial fibrillation, unspecified: Secondary | ICD-10-CM | POA: Diagnosis not present

## 2019-03-21 DIAGNOSIS — M8949 Other hypertrophic osteoarthropathy, multiple sites: Secondary | ICD-10-CM | POA: Diagnosis not present

## 2019-03-21 DIAGNOSIS — F419 Anxiety disorder, unspecified: Secondary | ICD-10-CM | POA: Diagnosis not present

## 2019-03-21 DIAGNOSIS — N6011 Diffuse cystic mastopathy of right breast: Secondary | ICD-10-CM | POA: Diagnosis not present

## 2019-03-21 DIAGNOSIS — R5381 Other malaise: Secondary | ICD-10-CM | POA: Diagnosis not present

## 2019-03-21 DIAGNOSIS — R5383 Other fatigue: Secondary | ICD-10-CM | POA: Diagnosis not present

## 2019-03-22 DIAGNOSIS — Z1231 Encounter for screening mammogram for malignant neoplasm of breast: Secondary | ICD-10-CM | POA: Diagnosis not present

## 2019-03-23 ENCOUNTER — Telehealth: Payer: Self-pay | Admitting: Cardiology

## 2019-03-23 NOTE — Telephone Encounter (Signed)
Patient wants to either be a televisit tomorrow or reschedule. She had bloodwork drawn at Dr. Anders Grant for her sodium and potassium counts. Please advise.

## 2019-03-23 NOTE — Progress Notes (Signed)
Virtual Visit via Video Note   This visit type was conducted due to national recommendations for restrictions regarding the COVID-19 Pandemic (e.g. social distancing) in an effort to limit this patient's exposure and mitigate transmission in our community.  Due to her co-morbid illnesses, this patient is at least at moderate risk for complications without adequate follow up.  This format is felt to be most appropriate for this patient at this time.  All issues noted in this document were discussed and addressed.  A limited physical exam was performed with this format.  Please refer to the patient's chart for her consent to telehealth for [facility].   Date:  03/24/2019   ID:  Jennifer Martin, DOB 05-25-1956, MRN [id number]  Patient Location: Home Provider Location: Office  PCP:  [name]., MD  Cardiologist:  [name], MD  Electrophysiologist:  Regan Lemming, MD   Evaluation Performed:  Follow-Up Visit  Chief Complaint:  Heart failure  History of Present Illness:    Jennifer Martin is a 63 y.o. female with a hx of Jennifer Martin is a 63 y.o. female with a hx of CAD, Heart failure with EF normalized with ARNI ,Dyslipidemia, HTN, PAF, COPD  and  atrial flutter status post atrial fibrillation/flutter EP ablation 05/29/17  who was last seen by me 02/17/19.  Her last echocardiogram 12/15/2017 shows mild LVH normal left ventricular systolic function and mild mitral and aortic regurgitation Last myocardial perfusion study was performed 01/26/2017 showing no ischemia and the ejection fraction at that time 61%. She is followed by electrophysiology and has had a durable response to catheter ablation with no clinical recurrence of atrial fibrillation or flutter. She remains anticoagulated with rivaroxaban. In general she is doing well her weights are stable at home no edema shortness of breath she has had no angina recurrent atrial fibrillation or palpitation tolerates her  anticoagulant without bleeding and has not had a TIA.  She is taking her statin varying between daily and every other day with poor tolerance but continues to take a low-dose of a low intensity statin.  Lab work was done in her PCP office 03/21/2019 cholesterol 195 HDL 68 LDL 115 CBC normal LFTs normal creatinine normal potassium 3.6  The patient does not have symptoms concerning for COVID-19 infection (fever, chills, cough, or new shortness of breath).    Past Medical History:  Diagnosis Date   Anxiety    Atherosclerosis of coronary artery of native heart with angina pectoris (HCC)    Atrial flutter (HCC)    CAD (coronary artery disease)    CHF (congestive heart failure) (HCC)    COPD (chronic obstructive pulmonary disease) (HCC)    Demand ischemia (HCC)    Depression    Difficult intubation    " small airway and need a little tube "   Diverticulosis    GERD (gastroesophageal reflux disease)    Hyperlipidemia    Hypertension    Inappropriate ADH syndrome (HCC)    LBBB (left bundle branch block)    Migraine    "none since ~ 2012; mild ones then when I did have them because of the beta blockers I was on" (06/09/2017)   Myocardial infarction (HCC)    "I've had light ones" (06/09/2017)   On amiodarone therapy 04/13/2017   Osteoarthritis    Pneumonia    "couple times" (06/09/2017)   Past Surgical History:  Procedure Laterality Date   ATRIAL FIBRILLATION ABLATION N/A  05/29/2017   Procedure: Atrial Fibrillation Ablation;  Surgeon: Regan Lemming, MD;  Location: [facility] INVASIVE CV LAB;  Service: Cardiovascular;  Laterality: N/A;   BREAST SURGERY Left    "took a gland out; milk duct"   CARDIAC CATHETERIZATION  2008   has had 2 procedures, the last one approx 2008, never had PCI/stent. Dr [name]   CARPAL TUNNEL RELEASE Left    DILATION AND CURETTAGE OF UTERUS     "related to heavy bleeding"   INGUINAL HERNIA REPAIR Left    KNEE SURGERY Left 10/2018    LACRIMAL DUCT EXPLORATION Right 04/01/2018   Procedure: LACRIMAL DUCT EXPLORATION AND ETHMOIDECTOMY;  Surgeon: Floydene Flock, MD;  Location: [facility] OR;  Service: Ophthalmology;  Laterality: Right;   SHOULDER ARTHROSCOPY WITH ROTATOR CUFF REPAIR Right    TEAR DUCT PROBING Right 04/01/2018   Procedure: TEAR DUCT PROBING WITH STENT;  Surgeon: Floydene Flock, MD;  Location: [facility] OR;  Service: Ophthalmology;  Laterality: Right;   TRACHEOSTOMY  2006   "closed on it's own"   TUBAL LIGATION     VAGINAL HYSTERECTOMY     "fibroids"     Current Meds  Medication Sig   acetaminophen (TYLENOL) 500 MG tablet Take 1,000 mg by mouth 2 (two) times daily as needed for mild pain or headache.   albuterol (PROVENTIL HFA;VENTOLIN HFA) 108 (90 Base) MCG/ACT inhaler Inhale 2 puffs into the lungs every 6 (six) hours as needed for wheezing or shortness of breath.   budesonide-formoterol (SYMBICORT) 160-4.5 MCG/ACT inhaler Inhale 2 puffs into the lungs 2 (two) times daily as needed.    Calcium Carbonate-Vitamin D (CALCIUM 600+D PO) Take 1 tablet by mouth daily.    escitalopram (LEXAPRO) 10 MG tablet Take 10 mg by mouth daily.    fluticasone (FLONASE) 50 MCG/ACT nasal spray Place 2 sprays into both nostrils daily.    furosemide (LASIX) 20 MG tablet Take 1 tablet (20 mg total) by mouth 2 (two) times daily. Take only one daily if you weigh 124lbs or less.   LORazepam (ATIVAN) 1 MG tablet Take 1 mg by mouth 3 (three) times daily as needed for anxiety.    nitroGLYCERIN (NITROSTAT) 0.4 MG SL tablet Place 1 tablet (0.4 mg total) under the tongue every 5 (five) minutes as needed for chest pain.   Omega-3 Fatty Acids (FISH OIL) 1000 MG CAPS Take 2 capsules by mouth daily with breakfast.   pantoprazole (PROTONIX) 40 MG tablet Take 40 mg by mouth daily.   potassium chloride SA (K-DUR,KLOR-CON) 20 MEQ tablet TAKE 1/2 TABLET BY MOUTH TWICE DAILY   pravastatin (PRAVACHOL) 20 MG tablet Take 10 mg by mouth every  other day.   rivaroxaban (XARELTO) 20 MG TABS tablet Take 1 tablet (20 mg total) daily by mouth.   sacubitril-valsartan (ENTRESTO) 97-103 MG Take 1 tablet by mouth 2 (two) times daily.   [DISCONTINUED] pravastatin (PRAVACHOL) 20 MG tablet Take 1 tablet (20 mg total) by mouth every evening. (Patient taking differently: Take 20 mg by mouth every other day. )     Allergies:   Atorvastatin; Cefdinir; and Levofloxacin   Social History   Tobacco Use   Smoking status: Current Every Day Smoker    Packs/day: 0.50    Years: 34.00    Pack years: 17.00    Types: Cigarettes   Smokeless tobacco: Never Used  Substance Use Topics   Alcohol use: No   Drug use: No     Family Hx: The patient's family history  includes CAD in her brother; Heart disease in her brother.  ROS:   Please see the history of present illness.     All other systems reviewed and are negative.   Prior CV studies:   The following studies were reviewed today:    Labs/Other Tests and Data Reviewed:    EKG:  EKG:  EKG 10/01/18 and personally reviewed.  The ekg ordered today demonstrates SRTH LBBB repolarization  Recent Labs: 04/01/2018: Hemoglobin 11.6; Platelets 268 02/21/2019: ALT 10; BUN 10; Creatinine, Ser 0.57; NT-Pro BNP 203; Potassium 4.5; Sodium 131   Recent Lipid Panel Lab Results  Component Value Date/Time   CHOL 166 02/21/2019 09:57 AM   TRIG 86 02/21/2019 09:57 AM   HDL 65 02/21/2019 09:57 AM   CHOLHDL 2.6 02/21/2019 09:57 AM   CHOLHDL 3.2 06/11/2017 02:39 PM   LDLCALC 84 02/21/2019 09:57 AM    Wt Readings from Last 3 Encounters:  03/24/19 126 lb (57.2 kg)  02/17/19 128 lb (58.1 kg)  02/14/19 130 lb (59 kg)     Objective:    Vital Signs:  BP 108/64 (BP Location: Left Arm, Patient Position: Sitting)    Pulse 72    Ht 5' (1.524 m)    Wt 126 lb (57.2 kg)    SpO2 98%    BMI 24.61 kg/m    VITAL SIGNS:  reviewed GEN:  no acute distress EYES:  sclerae anicteric, EOMI - Extraocular Movements  Intact RESPIRATORY:  normal respiratory effort, symmetric expansion CARDIOVASCULAR:  no peripheral edema SKIN:  no rash, lesions or ulcers. MUSCULOSKELETAL:  no obvious deformities. NEURO:  alert and oriented x 3, no obvious focal deficit PSYCH:  normal affect  ASSESSMENT & PLAN:     1.  Heart failure stable compensated no fluid overload New York Heart Association class I continue her current diuretic and guideline directed therapy for systolic dysfunction.  We will need to consider an echocardiogram next visit. Hypertensive heart disease stable continue current guideline directed therapy CAD stable New York Heart Association class I continue current medical treatment including anticoagulant and lipid-lowering therapy. Atrial fibrillation paroxysmal improved after EP intervention continue anticoagulation no longer on amiodarone COPD stable managed by her PCP Hyperlipidemia stable she is poorly statin intolerant next visit face-to-face I will bring up the issue of PCSK9 therapy Chronic anticoagulation continue her current anticoagulant no bleeding complication  COVID-19 Education: The signs and symptoms of COVID-19 were discussed with the patient and how to seek care for testing (follow up with PCP or arrange E-visit).  The importance of social distancing was discussed today.  Time:   Today, I have spent 20 minutes with the patient with telehealth technology discussing the above problems.     Medication Adjustments/Labs and Tests Ordered: Current medicines are reviewed at length with the patient today.  Concerns regarding medicines are outlined above.   Tests Ordered: No orders of the defined types were placed in this encounter.   Medication Changes: No orders of the defined types were placed in this encounter.   Disposition:  Follow up in 6 month(s)  Signed, Norman HerrlichBrian Belia Febo, MD  03/24/2019 11:24 AM    [facility]

## 2019-03-23 NOTE — Telephone Encounter (Signed)
Patient aware that her appointment will be virtual on 03-24-2019.

## 2019-03-23 NOTE — Telephone Encounter (Signed)
Good idea, yes

## 2019-03-24 ENCOUNTER — Other Ambulatory Visit: Payer: Self-pay

## 2019-03-24 ENCOUNTER — Telehealth (INDEPENDENT_AMBULATORY_CARE_PROVIDER_SITE_OTHER): Payer: PPO | Admitting: Cardiology

## 2019-03-24 ENCOUNTER — Encounter: Payer: Self-pay | Admitting: Cardiology

## 2019-03-24 VITALS — BP 108/64 | HR 72 | Ht 60.0 in | Wt 126.0 lb

## 2019-03-24 DIAGNOSIS — E782 Mixed hyperlipidemia: Secondary | ICD-10-CM

## 2019-03-24 DIAGNOSIS — I5042 Chronic combined systolic (congestive) and diastolic (congestive) heart failure: Secondary | ICD-10-CM

## 2019-03-24 DIAGNOSIS — I5043 Acute on chronic combined systolic (congestive) and diastolic (congestive) heart failure: Secondary | ICD-10-CM

## 2019-03-24 DIAGNOSIS — I110 Hypertensive heart disease with heart failure: Secondary | ICD-10-CM

## 2019-03-24 DIAGNOSIS — Z7901 Long term (current) use of anticoagulants: Secondary | ICD-10-CM

## 2019-03-24 DIAGNOSIS — I480 Paroxysmal atrial fibrillation: Secondary | ICD-10-CM

## 2019-03-24 DIAGNOSIS — J449 Chronic obstructive pulmonary disease, unspecified: Secondary | ICD-10-CM

## 2019-03-24 DIAGNOSIS — I25119 Atherosclerotic heart disease of native coronary artery with unspecified angina pectoris: Secondary | ICD-10-CM

## 2019-03-24 MED ORDER — FUROSEMIDE 20 MG PO TABS: 20.0000 mg | ORAL_TABLET | Freq: Two times a day (BID) | ORAL | 1 refills | Status: DC

## 2019-03-24 NOTE — Patient Instructions (Signed)
Medication Instructions:  Your physician recommends that you continue on your current medications as directed. Please refer to the Current Medication list given to you today.  If you need a refill on your cardiac medications before your next appointment, please call your pharmacy.   Lab work: None If you have labs (blood work) drawn today and your tests are completely normal, you will receive your results only by: . MyChart Message (if you have MyChart) OR . A paper copy in the mail If you have any lab test that is abnormal or we need to change your treatment, we will call you to review the results.  Testing/Procedures: None  Follow-Up: At [facility], you and your health needs are our priority.  As part of our continuing mission to provide you with exceptional heart care, we have created designated Provider Care Teams.  These Care Teams include your primary Cardiologist (physician) and Advanced Practice Providers (APPs -  Physician Assistants and Nurse Practitioners) who all work together to provide you with the care you need, when you need it. You will need a follow up appointment in 6 months. Any Other Special Instructions Will Be Listed Below (If Applicable).    

## 2019-03-24 NOTE — Addendum Note (Signed)
Addended by: Fayrene Fearing B on: 03/24/2019 12:00 PM   Modules accepted: Orders

## 2019-04-04 DIAGNOSIS — N6011 Diffuse cystic mastopathy of right breast: Secondary | ICD-10-CM | POA: Diagnosis not present

## 2019-04-04 DIAGNOSIS — N6012 Diffuse cystic mastopathy of left breast: Secondary | ICD-10-CM | POA: Diagnosis not present

## 2019-05-03 DIAGNOSIS — F419 Anxiety disorder, unspecified: Secondary | ICD-10-CM | POA: Diagnosis not present

## 2019-05-05 ENCOUNTER — Other Ambulatory Visit: Payer: Self-pay | Admitting: Cardiology

## 2019-05-05 DIAGNOSIS — I5043 Acute on chronic combined systolic (congestive) and diastolic (congestive) heart failure: Secondary | ICD-10-CM

## 2019-05-20 ENCOUNTER — Other Ambulatory Visit: Payer: Self-pay | Admitting: Cardiology

## 2019-05-20 DIAGNOSIS — I25118 Atherosclerotic heart disease of native coronary artery with other forms of angina pectoris: Secondary | ICD-10-CM

## 2019-05-20 DIAGNOSIS — I480 Paroxysmal atrial fibrillation: Secondary | ICD-10-CM

## 2019-05-20 DIAGNOSIS — I4892 Unspecified atrial flutter: Secondary | ICD-10-CM

## 2019-05-31 ENCOUNTER — Other Ambulatory Visit: Payer: Self-pay | Admitting: Cardiology

## 2019-05-31 DIAGNOSIS — I5042 Chronic combined systolic (congestive) and diastolic (congestive) heart failure: Secondary | ICD-10-CM

## 2019-06-03 DIAGNOSIS — F330 Major depressive disorder, recurrent, mild: Secondary | ICD-10-CM | POA: Diagnosis not present

## 2019-06-03 DIAGNOSIS — J40 Bronchitis, not specified as acute or chronic: Secondary | ICD-10-CM | POA: Diagnosis not present

## 2019-07-28 DIAGNOSIS — Z23 Encounter for immunization: Secondary | ICD-10-CM | POA: Diagnosis not present

## 2019-07-29 ENCOUNTER — Telehealth: Payer: Self-pay | Admitting: Cardiology

## 2019-07-29 NOTE — Telephone Encounter (Signed)
Patient takes Jennifer Martin and she has reached the [facility] and she cant afford.. is there something else she can do??

## 2019-08-01 NOTE — Telephone Encounter (Signed)
Called patient who reports that xarelto, entresto, and symbicort are all costing in total over $300 each month and she cannot afford this. Advised patient to reach out to her insurance company to see if there are any opportunities for financial assistance. Also, informed patient to contact [place] Patient Assistance and [employer] Patient Assistance for assistance with entresto. Patient is agreeable and states that she has the patient assistance contact information for xarelto and provided her with [employer]' phone number [phone] to call and discuss entresto. Patient verbalized understanding. No further questions.

## 2019-08-29 ENCOUNTER — Encounter: Payer: Self-pay | Admitting: Cardiology

## 2019-08-29 ENCOUNTER — Ambulatory Visit (INDEPENDENT_AMBULATORY_CARE_PROVIDER_SITE_OTHER): Payer: PPO | Admitting: Cardiology

## 2019-08-29 ENCOUNTER — Other Ambulatory Visit: Payer: Self-pay

## 2019-08-29 VITALS — BP 130/68 | HR 72 | Ht 60.0 in | Wt 133.0 lb

## 2019-08-29 DIAGNOSIS — I480 Paroxysmal atrial fibrillation: Secondary | ICD-10-CM

## 2019-08-29 NOTE — Progress Notes (Signed)
Electrophysiology Office Note   Date:  08/29/2019   ID:  RETINA BERNARDY, DOB [birth year], MRN [id number]  PCP:  Raina Mina., MD  Cardiologist:  Bettina Gavia Primary Electrophysiologist:  Telisha Zawadzki Meredith Leeds, MD    No chief complaint on file.    History of Present Illness: Jennifer Martin is a 63 y.o. female who is being seen today for the evaluation of atrial flutter at the request of Shirlee More. Presenting today for electrophysiology evaluation. She has a history of coronary artery disease, hyperlipidemia, hypertension, and atrial flutter. She also has COPD with a demand ischemia and heart failure. She was admitted to [facility] in atrial fibrillation in April. She has no angina only mild shortness of breath, felt related to her COPD. She has not had syncope or bleeding complications of her anticoagulants. She was placed on amiodarone during her hospitalization in April.  He had an atrial fibrillation ablation 05/29/17.  Today, denies symptoms of palpitations, chest pain, shortness of breath, orthopnea, PND, lower extremity edema, claudication, dizziness, presyncope, syncope, bleeding, or neurologic sequela. The patient is tolerating medications without difficulties.  Overall she feels well.  She has no complaints today.  Past Medical History:  Diagnosis Date  . Anxiety   . Atherosclerosis of coronary artery of native heart with angina pectoris ([place])   . Atrial flutter ([place])   . CAD (coronary artery disease)   . CHF (congestive heart failure) ([place])   . COPD (chronic obstructive pulmonary disease) ([place])   . Demand ischemia ([place])   . Depression   . Difficult intubation    " small airway and need a little tube "  . Diverticulosis   . GERD (gastroesophageal reflux disease)   . Hyperlipidemia   . Hypertension   . Inappropriate ADH syndrome ([place])   . LBBB (left bundle branch block)   . Migraine    "none since ~ 2012; mild ones then when I did have them because of the beta  blockers I was on" (06/09/2017)  . Myocardial infarction ([place])    "I've had light ones" (06/09/2017)  . On amiodarone therapy 04/13/2017  . Osteoarthritis   . Pneumonia    "couple times" (06/09/2017)   Past Surgical History:  Procedure Laterality Date  . ATRIAL FIBRILLATION ABLATION N/A 05/29/2017   Procedure: Atrial Fibrillation Ablation;  Surgeon: Constance Haw, MD;  Location: [place] [facility];  Service: Cardiovascular;  Laterality: N/A;  . BREAST SURGERY Left    "took a gland out; milk duct"  . CARDIAC CATHETERIZATION  2008   has had 2 procedures, the last one approx 2008, never had PCI/stent. Dr Bettina Gavia  . CARPAL TUNNEL RELEASE Left   . DILATION AND CURETTAGE OF UTERUS     "related to heavy bleeding"  . INGUINAL HERNIA REPAIR Left   . KNEE SURGERY Left 10/2018  . LACRIMAL DUCT EXPLORATION Right 04/01/2018   Procedure: LACRIMAL DUCT EXPLORATION AND ETHMOIDECTOMY;  Surgeon: Clista Bernhardt, MD;  Location: [place];  Service: Ophthalmology;  Laterality: Right;  . SHOULDER ARTHROSCOPY WITH ROTATOR CUFF REPAIR Right   . TEAR DUCT PROBING Right 04/01/2018   Procedure: TEAR DUCT PROBING WITH STENT;  Surgeon: Clista Bernhardt, MD;  Location: [place];  Service: Ophthalmology;  Laterality: Right;  . TRACHEOSTOMY  2006   "closed on it's own"  . TUBAL LIGATION    . VAGINAL HYSTERECTOMY     "fibroids"     Current Outpatient Medications  Medication Sig Dispense Refill  .  acetaminophen (TYLENOL) 500 MG tablet Take 1,000 mg by mouth 2 (two) times daily as needed for mild pain or headache.    . albuterol (PROVENTIL HFA;VENTOLIN HFA) 108 (90 Base) MCG/ACT inhaler Inhale 2 puffs into the lungs every 6 (six) hours as needed for wheezing or shortness of breath.    . budesonide-formoterol (SYMBICORT) 160-4.5 MCG/ACT inhaler Inhale 2 puffs into the lungs 2 (two) times daily as needed.     . Calcium Carbonate-Vitamin D (CALCIUM 600+D PO) Take 1 tablet by mouth daily.     Marland Kitchen ENTRESTO 97-103 MG  Take 1 tablet by mouth 2 (two) times daily. 180 tablet 1  . escitalopram (LEXAPRO) 10 MG tablet Take 10 mg by mouth daily.     . fluticasone (FLONASE) 50 MCG/ACT nasal spray Place 2 sprays into both nostrils daily.     Marland Kitchen LORazepam (ATIVAN) 1 MG tablet Take 1 mg by mouth 3 (three) times daily as needed for anxiety.     . nitroGLYCERIN (NITROSTAT) 0.4 MG SL tablet Place 1 tablet (0.4 mg total) under the tongue every 5 (five) minutes as needed for chest pain. 25 tablet 11  . Omega-3 Fatty Acids (FISH OIL) 1000 MG CAPS Take 2 capsules by mouth daily with breakfast.    . pantoprazole (PROTONIX) 40 MG tablet Take 40 mg by mouth daily.    . potassium chloride SA (K-DUR) 20 MEQ tablet TAKE 1/2 TABLET BY MOUTH TWICE DAILY 45 tablet 2  . pravastatin (PRAVACHOL) 20 MG tablet Take 10 mg by mouth every other day.    Carlena Hurl 20 MG TABS tablet TAKE ONE TABLET BY MOUTH ONCE DAILY 90 tablet 2  . furosemide (LASIX) 20 MG tablet Take 1 tablet (20 mg total) by mouth 2 (two) times daily. Take only one daily if you weigh 124lbs or less. 180 tablet 1   No current facility-administered medications for this visit.     Allergies:   Atorvastatin, Cefdinir, and Levofloxacin   Social History:  The patient  reports that she has been smoking cigarettes. She has a 17.00 pack-year smoking history. She has never used smokeless tobacco. She reports that she does not drink alcohol or use drugs.   Family History:  The patient's family history includes CAD in her brother; Heart disease in her brother.   ROS:  Please see the history of present illness.   Otherwise, review of systems is positive for none.   All other systems are reviewed and negative.   PHYSICAL EXAM: VS:  BP 130/68   Pulse 72   Ht 5' (1.524 m)   Wt 133 lb (60.3 kg)   SpO2 99%   BMI 25.97 kg/m  , BMI Body mass index is 25.97 kg/m. GEN: Well nourished, well developed, in no acute distress  HEENT: normal  Neck: no JVD, carotid bruits, or masses Cardiac:  RRR; no murmurs, rubs, or gallops,no edema  Respiratory:  clear to auscultation bilaterally, normal work of breathing GI: soft, nontender, nondistended, + BS MS: no deformity or atrophy  Skin: warm and dry Neuro:  Strength and sensation are intact Psych: euthymic mood, full affect  EKG:  EKG is ordered today. Personal review of the ekg ordered shows sinus rhythm, intermittent left bundle branch block, rate 72  Recent Labs: 02/21/2019: ALT 10; BUN 10; Creatinine, Ser 0.57; NT-Pro BNP 203; Potassium 4.5; Sodium 131    Lipid Panel     Component Value Date/Time   CHOL 166 02/21/2019 0957   TRIG 86 02/21/2019  0957   HDL 65 02/21/2019 0957   CHOLHDL 2.6 02/21/2019 0957   CHOLHDL 3.2 06/11/2017 1439   VLDL 20 06/11/2017 1439   LDLCALC 84 02/21/2019 0957     Wt Readings from Last 3 Encounters:  08/29/19 133 lb (60.3 kg)  03/24/19 126 lb (57.2 kg)  02/17/19 128 lb (58.1 kg)      Other studies Reviewed: Additional studies/ records that were reviewed today include: TTE 12/15/17 - Left ventricle: The cavity size was normal. Wall thickness was   increased in a pattern of mild LVH. Systolic function was normal.   Wall motion was normal; there were no regional wall motion   abnormalities. Doppler parameters are consistent with abnormal   left ventricular relaxation (grade 1 diastolic dysfunction). - Aortic valve: There was mild regurgitation. Valve area (VTI):   3.03 cm^2. Valve area (Vmax): 2.51 cm^2. Valve area (Vmean): 2.51   cm^2. - Mitral valve: There was mild regurgitation. - Left atrium: The atrium was moderately dilated.  ASSESSMENT AND PLAN:  1.  Atrial fibrillation/flutter: Currently on Xarelto.  Status post atrial fibrillation/flutter ablation 05/29/2017.  She remains in sinus rhythm.  No changes.  This patients CHA2DS2-VASc Score and unadjusted Ischemic Stroke Rate (% per year) is equal to 3.2 % stroke rate/year from a score of 3  Above score calculated as 1 point each  if present [CHF, HTN, DM, Vascular=MI/PAD/Aortic Plaque, Age if 65-74, or Female] Above score calculated as 2 points each if present [Age > 75, or Stroke/TIA/TE]    2. Hypertensive heart disease with heart failure: Currently well controlled  3. Hyperlipidemia: Continue statin per primary cardiology.  4. Coronary artery disease with stable angina: No current chest pain.  Continue Plavix.   Current medicines are reviewed at length with the patient today.   The patient does not have concerns regarding her medicines.  The following changes were made today: None  Labs/ tests ordered today include:  Orders Placed This Encounter  Procedures  . EKG 12-Lead     Disposition:   FU with Melissa Pulido 12 months  Signed, Maayan Jenning [name], MD  08/29/2019 11:43 AM     [facility] [address] [place] [address] [phone] (office) [phone] (fax)

## 2019-08-29 NOTE — Patient Instructions (Addendum)
Medication Instructions:  Your physician recommends that you continue on your current medications as directed. Please refer to the Current Medication list given to you today.  * If you need a refill on your cardiac medications before your next appointment, please call your pharmacy. *  Labwork: None ordered  Testing/Procedures: None ordered  Follow-Up: Your physician wants you to follow-up in: 12 months with Dr. Camnitz.  You will receive a reminder letter in the mail two months in advance. If you don't receive a letter, please call our office to schedule the follow-up appointment.  Thank you for choosing [facility]!!   Jahdiel Krol, RN [phone]  Any Other Special Instructions Will Be Listed Below (If Applicable).        

## 2019-09-02 ENCOUNTER — Other Ambulatory Visit: Payer: Self-pay | Admitting: Cardiology

## 2019-09-02 DIAGNOSIS — I5043 Acute on chronic combined systolic (congestive) and diastolic (congestive) heart failure: Secondary | ICD-10-CM

## 2019-09-02 NOTE — Telephone Encounter (Signed)
Rx refill sent to pharmacy. 

## 2019-09-22 DIAGNOSIS — I5022 Chronic systolic (congestive) heart failure: Secondary | ICD-10-CM | POA: Diagnosis not present

## 2019-09-22 DIAGNOSIS — Z789 Other specified health status: Secondary | ICD-10-CM

## 2019-09-22 DIAGNOSIS — I25118 Atherosclerotic heart disease of native coronary artery with other forms of angina pectoris: Secondary | ICD-10-CM | POA: Diagnosis not present

## 2019-09-22 DIAGNOSIS — M8949 Other hypertrophic osteoarthropathy, multiple sites: Secondary | ICD-10-CM | POA: Diagnosis not present

## 2019-09-22 DIAGNOSIS — R5381 Other malaise: Secondary | ICD-10-CM | POA: Diagnosis not present

## 2019-09-22 DIAGNOSIS — Z0000 Encounter for general adult medical examination without abnormal findings: Secondary | ICD-10-CM | POA: Diagnosis not present

## 2019-09-22 DIAGNOSIS — E222 Syndrome of inappropriate secretion of antidiuretic hormone: Secondary | ICD-10-CM | POA: Diagnosis not present

## 2019-09-22 DIAGNOSIS — M1009 Idiopathic gout, multiple sites: Secondary | ICD-10-CM | POA: Diagnosis not present

## 2019-09-22 DIAGNOSIS — Z79899 Other long term (current) drug therapy: Secondary | ICD-10-CM | POA: Diagnosis not present

## 2019-09-22 DIAGNOSIS — N6011 Diffuse cystic mastopathy of right breast: Secondary | ICD-10-CM | POA: Diagnosis not present

## 2019-09-22 DIAGNOSIS — J441 Chronic obstructive pulmonary disease with (acute) exacerbation: Secondary | ICD-10-CM | POA: Diagnosis not present

## 2019-09-22 DIAGNOSIS — R5383 Other fatigue: Secondary | ICD-10-CM | POA: Diagnosis not present

## 2019-09-22 DIAGNOSIS — M1A09X0 Idiopathic chronic gout, multiple sites, without tophus (tophi): Secondary | ICD-10-CM | POA: Diagnosis not present

## 2019-09-22 DIAGNOSIS — I110 Hypertensive heart disease with heart failure: Secondary | ICD-10-CM | POA: Diagnosis not present

## 2019-09-22 DIAGNOSIS — E782 Mixed hyperlipidemia: Secondary | ICD-10-CM | POA: Diagnosis not present

## 2019-09-22 DIAGNOSIS — F419 Anxiety disorder, unspecified: Secondary | ICD-10-CM | POA: Diagnosis not present

## 2019-09-22 DIAGNOSIS — Z8679 Personal history of other diseases of the circulatory system: Secondary | ICD-10-CM | POA: Diagnosis not present

## 2019-09-22 HISTORY — DX: Other specified health status: Z78.9

## 2019-09-26 ENCOUNTER — Other Ambulatory Visit: Payer: Self-pay | Admitting: *Deleted

## 2019-09-26 DIAGNOSIS — I5042 Chronic combined systolic (congestive) and diastolic (congestive) heart failure: Secondary | ICD-10-CM

## 2019-09-26 MED ORDER — ENTRESTO 97-103 MG PO TABS: ORAL_TABLET | ORAL | 3 refills | Status: DC

## 2019-09-27 ENCOUNTER — Encounter: Payer: Self-pay | Admitting: Cardiology

## 2019-09-27 DIAGNOSIS — G720 Drug-induced myopathy: Secondary | ICD-10-CM

## 2019-09-27 DIAGNOSIS — T466X5A Adverse effect of antihyperlipidemic and antiarteriosclerotic drugs, initial encounter: Secondary | ICD-10-CM | POA: Insufficient documentation

## 2019-09-27 HISTORY — DX: Drug-induced myopathy: T46.6X5A

## 2019-09-27 HISTORY — DX: Drug-induced myopathy: G72.0

## 2019-09-28 NOTE — Progress Notes (Signed)
Cardiology Office Note:    Date:  09/29/2019   ID:  Jennifer Martin, DOB [birth year], MRN [id number]  PCP:  Raina Mina., MD  Cardiologist:  Shirlee More, MD    Referring MD: Raina Mina., MD    ASSESSMENT:    1. Paroxysmal atrial fibrillation (HCC)   2. Chronic anticoagulation   3. Hypertensive heart disease with heart failure ([place])   4. Chronic combined systolic and diastolic heart failure ([place])   5. Mixed hyperlipidemia   6. Coronary artery disease involving native coronary artery of native heart with angina pectoris ([place])   7. Chronic obstructive pulmonary disease, unspecified COPD type ([place])    PLAN:    In order of problems listed above:  1. Stable maintaining sinus rhythm after cryoablation not requiring antiarrhythmic drug.  She will continue anticoagulation with Xarelto moderate stroke risk 2. Continue her anticoagulant no bleeding complications 3. Stable BP at target continue guideline directed therapy including Entresto and her current loop diuretic New York Heart Association class I she has no edema. 4. Hyperlipidemia poorly controlled she stopped taking her statin she has had statin intolerance but tolerates low-dose low intensity pravastatin will resume taking in the evening to enhance activity 5. CAD is stable New York Heart Association class I having no angina on current treatment continue the same.  At this time does not require an ischemia evaluation 6. Stable COPD 7. For COVID-19 I asked her to start wearing eye protection   Next appointment: 6 months   Medication Adjustments/Labs and Tests Ordered: Current medicines are reviewed at length with the patient today.  Concerns regarding medicines are outlined above.  No orders of the defined types were placed in this encounter.  No orders of the defined types were placed in this encounter.   Chief Complaint  Patient presents with  . Follow-up    History of Present Illness:    Jennifer Martin is a 63  y.o. female with a hx of CAD, Heart failure with EF normalized with ARNI ,Dyslipidemia, HTN, PAF, COPD  and  atrial flutter status post atrial fibrillation/flutter EP ablation 05/29/17.    She was last seen 03/24/2019. Compliance with diet, lifestyle and medications: No she has not been taking pravastatin  Her last echocardiogram 12/15/2017 shows mild LVH normal left ventricular systolic function and mild mitral and aortic regurgitation Last myocardial perfusion study was performed 01/26/2017 showing no ischemia and the ejection fraction at that time 61%. She is followed by electrophysiology and has had a durable response to catheter ablation with no clinical recurrence of atrial fibrillation or flutter. She remains anticoagulated with rivaroxaban.  She had lab work performed 09/22/2019 at her PCP office.  Her previous lipid profile was at target this however was not with cholesterol 194 triglycerides 153 HDL 62 and LDL of 129.  Her liver function test creatinine were normal potassium 3.8 and a normal CBC hemoglobin 13.2.  Her husband has lung cancer just finished chemotherapy and they are exceptionally careful with her exposures and preventive measures with COVID-19.  She has had no angina dyspnea palpitations syncope or edema.  She acknowledges stopping her pravastatin and it was not for muscle pain or symptoms says that she tolerated daily will go back to taking at nighttime and this is a way we have managed her statin intolerance in the past.  Her EKG 08/30/2019 showed sinus rhythm left bundle branch block repolarization changes independently reviewed Past Medical History:  Diagnosis Date  . Anxiety   .  Atherosclerosis of coronary artery of native heart with angina pectoris (HCC)   . Atrial flutter (HCC)   . CAD (coronary artery disease)   . CHF (congestive heart failure) (HCC)   . COPD (chronic obstructive pulmonary disease) (HCC)   . Demand ischemia (HCC)   . Depression   . Difficult  intubation    " small airway and need a little tube "  . Diverticulosis   . GERD (gastroesophageal reflux disease)   . Hyperlipidemia   . Hypertension   . Inappropriate ADH syndrome (HCC)   . LBBB (left bundle branch block)   . Migraine    "none since ~ 2012; mild ones then when I did have them because of the beta blockers I was on" (06/09/2017)  . Myocardial infarction (HCC)    "I've had light ones" (06/09/2017)  . On amiodarone therapy 04/13/2017  . Osteoarthritis   . Pneumonia    "couple times" (06/09/2017)    Past Surgical History:  Procedure Laterality Date  . ATRIAL FIBRILLATION ABLATION N/A 05/29/2017   Procedure: Atrial Fibrillation Ablation;  Surgeon: Regan Lemming, MD;  Location: [facility] INVASIVE [facility];  Service: Cardiovascular;  Laterality: N/A;  . BREAST SURGERY Left    "took a gland out; milk duct"  . CARDIAC CATHETERIZATION  2008   has had 2 procedures, the last one approx 2008, never had PCI/stent. Dr [name]  . CARPAL TUNNEL RELEASE Left   . DILATION AND CURETTAGE OF UTERUS     "related to heavy bleeding"  . INGUINAL HERNIA REPAIR Left   . KNEE SURGERY Left 10/2018  . LACRIMAL DUCT EXPLORATION Right 04/01/2018   Procedure: LACRIMAL DUCT EXPLORATION AND ETHMOIDECTOMY;  Surgeon: Floydene Flock, MD;  Location: [facility] OR;  Service: Ophthalmology;  Laterality: Right;  . SHOULDER ARTHROSCOPY WITH ROTATOR CUFF REPAIR Right   . TEAR DUCT PROBING Right 04/01/2018   Procedure: TEAR DUCT PROBING WITH STENT;  Surgeon: Floydene Flock, MD;  Location: [facility] OR;  Service: Ophthalmology;  Laterality: Right;  . TRACHEOSTOMY  2006   "closed on it's own"  . TUBAL LIGATION    . VAGINAL HYSTERECTOMY     "fibroids"    Current Medications: Current Meds  Medication Sig  . acetaminophen (TYLENOL) 500 MG tablet Take 1,000 mg by mouth 2 (two) times daily as needed for mild pain or headache.  . albuterol (PROVENTIL HFA;VENTOLIN HFA) 108 (90 Base) MCG/ACT inhaler Inhale 2 puffs into the  lungs every 6 (six) hours as needed for wheezing or shortness of breath.  . budesonide-formoterol (SYMBICORT) 160-4.5 MCG/ACT inhaler Inhale 2 puffs into the lungs 2 (two) times daily as needed.   . Calcium Carbonate-Vitamin D (CALCIUM 600+D PO) Take 1 tablet by mouth daily.   Marland Kitchen escitalopram (LEXAPRO) 10 MG tablet Take 10 mg by mouth daily.   . fluticasone (FLONASE) 50 MCG/ACT nasal spray Place 2 sprays into both nostrils daily.   . furosemide (LASIX) 20 MG tablet Take 1 tablet (20 mg total) by mouth 2 (two) times daily. Take only one daily if you weigh 124lbs or less.  Marland Kitchen LORazepam (ATIVAN) 1 MG tablet Take 1 mg by mouth 3 (three) times daily as needed for anxiety.   . nitroGLYCERIN (NITROSTAT) 0.4 MG SL tablet Place 1 tablet (0.4 mg total) under the tongue every 5 (five) minutes as needed for chest pain.  . Omega-3 Fatty Acids (FISH OIL) 1000 MG CAPS Take 2 capsules by mouth daily with breakfast.  . pantoprazole (PROTONIX) 40 MG  tablet Take 40 mg by mouth daily.  . potassium chloride SA (KLOR-CON) 20 MEQ tablet TAKE 1/2 TABLET BY MOUTH TWICE DAILY  . pravastatin (PRAVACHOL) 20 MG tablet Take 10 mg by mouth every other day.  . sacubitril-valsartan (ENTRESTO) 97-103 MG Take 1 tablet by mouth 2 (two) times daily.  Carlena Hurl. XARELTO 20 MG TABS tablet TAKE ONE TABLET BY MOUTH ONCE DAILY     Allergies:   Atorvastatin, Cefdinir, and Levofloxacin   Social History   Socioeconomic History  . Marital status: Married    Spouse name: Not on file  . Number of children: Not on file  . Years of education: Not on file  . Highest education level: Not on file  Occupational History  . Not on file  Tobacco Use  . Smoking status: Current Every Day Smoker    Packs/day: 0.50    Years: 34.00    Pack years: 17.00    Types: Cigarettes  . Smokeless tobacco: Never Used  Substance and Sexual Activity  . Alcohol use: No  . Drug use: No  . Sexual activity: Not Currently  Other Topics Concern  . Not on file  Social  History Narrative  . Not on file   Social Determinants of Health   Financial Resource Strain:   . Difficulty of Paying Living Expenses: Not on file  Food Insecurity:   . Worried About Programme researcher, broadcasting/film/videounning Out of Food in the Last Year: Not on file  . Ran Out of Food in the Last Year: Not on file  Transportation Needs:   . Lack of Transportation (Medical): Not on file  . Lack of Transportation (Non-Medical): Not on file  Physical Activity:   . Days of Exercise per Week: Not on file  . Minutes of Exercise per Session: Not on file  Stress:   . Feeling of Stress : Not on file  Social Connections:   . Frequency of Communication with Friends and Family: Not on file  . Frequency of Social Gatherings with Friends and Family: Not on file  . Attends Religious Services: Not on file  . Active Member of Clubs or Organizations: Not on file  . Attends BankerClub or Organization Meetings: Not on file  . Marital Status: Not on file     Family History: The patient's family history includes CAD in her brother; Heart disease in her brother. ROS:   Please see the history of present illness.    All other systems reviewed and are negative.  EKGs/Labs/Other Studies Reviewed:    The following studies were reviewed today:  EKG: 08/30/2019 independently reviewed sinus rhythm left bundle branch block repolarization  Recent Labs:  Fortunately she had recent labs performed 09/22/2019 02/21/2019: ALT 10; BUN 10; Creatinine, Ser 0.57; NT-Pro BNP 203; Potassium 4.5; Sodium 131  Recent Lipid Panel    Component Value Date/Time   CHOL 166 02/21/2019 0957   TRIG 86 02/21/2019 0957   HDL 65 02/21/2019 0957   CHOLHDL 2.6 02/21/2019 0957   CHOLHDL 3.2 06/11/2017 1439   VLDL 20 06/11/2017 1439   LDLCALC 84 02/21/2019 0957    Physical Exam:    VS:  BP 114/66   Pulse 89   Ht 5' (1.524 m)   Wt 134 lb 9.6 oz (61.1 kg)   SpO2 93%   BMI 26.29 kg/m     Wt Readings from Last 3 Encounters:  09/29/19 134 lb 9.6 oz (61.1 kg)   08/29/19 133 lb (60.3 kg)  03/24/19 126 lb (57.2 kg)  GEN:  Well nourished, well developed in no acute distress HEENT: Normal NECK: No JVD; No carotid bruits LYMPHATICS: No lymphadenopathy CARDIAC: RRR, no murmurs, rubs, gallops RESPIRATORY:  Clear to auscultation without rales, wheezing or rhonchi  ABDOMEN: Soft, non-tender, non-distended MUSCULOSKELETAL:  No edema; No deformity  SKIN: Warm and dry NEUROLOGIC:  Alert and oriented x 3 PSYCHIATRIC:  Normal affect    Signed, [name], MD  09/29/2019 11:27 AM    [facility]

## 2019-09-29 ENCOUNTER — Encounter: Payer: Self-pay | Admitting: Cardiology

## 2019-09-29 ENCOUNTER — Ambulatory Visit (INDEPENDENT_AMBULATORY_CARE_PROVIDER_SITE_OTHER): Payer: PPO | Admitting: Cardiology

## 2019-09-29 ENCOUNTER — Other Ambulatory Visit: Payer: Self-pay

## 2019-09-29 VITALS — BP 114/66 | HR 89 | Ht 60.0 in | Wt 134.6 lb

## 2019-09-29 DIAGNOSIS — I110 Hypertensive heart disease with heart failure: Secondary | ICD-10-CM

## 2019-09-29 DIAGNOSIS — G720 Drug-induced myopathy: Secondary | ICD-10-CM | POA: Diagnosis not present

## 2019-09-29 DIAGNOSIS — I480 Paroxysmal atrial fibrillation: Secondary | ICD-10-CM | POA: Diagnosis not present

## 2019-09-29 DIAGNOSIS — J449 Chronic obstructive pulmonary disease, unspecified: Secondary | ICD-10-CM

## 2019-09-29 DIAGNOSIS — I5042 Chronic combined systolic (congestive) and diastolic (congestive) heart failure: Secondary | ICD-10-CM | POA: Diagnosis not present

## 2019-09-29 DIAGNOSIS — T466X5A Adverse effect of antihyperlipidemic and antiarteriosclerotic drugs, initial encounter: Secondary | ICD-10-CM | POA: Diagnosis not present

## 2019-09-29 DIAGNOSIS — E782 Mixed hyperlipidemia: Secondary | ICD-10-CM | POA: Diagnosis not present

## 2019-09-29 DIAGNOSIS — Z7901 Long term (current) use of anticoagulants: Secondary | ICD-10-CM | POA: Diagnosis not present

## 2019-09-29 DIAGNOSIS — I25119 Atherosclerotic heart disease of native coronary artery with unspecified angina pectoris: Secondary | ICD-10-CM | POA: Diagnosis not present

## 2019-09-29 NOTE — Patient Instructions (Addendum)
Medication Instructions:  Your physician recommends that you continue on your current medications as directed. Please refer to the Current Medication list given to you today.  *If you need a refill on your cardiac medications before your next appointment, please call your pharmacy*  Lab Work: None  If you have labs (blood work) drawn today and your tests are completely normal, you will receive your results only by: Marland Kitchen MyChart Message (if you have MyChart) OR . A paper copy in the mail If you have any lab test that is abnormal or we need to change your treatment, we will call you to review the results.  Testing/Procedures: None  Follow-Up: At [facility], you and your health needs are our priority.  As part of our continuing mission to provide you with exceptional heart care, we have created designated Provider Care Teams.  These Care Teams include your primary Cardiologist (physician) and Advanced Practice Providers (APPs -  Physician Assistants and Nurse Practitioners) who all work together to provide you with the care you need, when you need it.  Your next appointment:   6 month(s)  The format for your next appointment:   In Person  Provider:   Shirlee More, MD  Purchase on line at [facility] or at [facility]

## 2019-10-07 DIAGNOSIS — J40 Bronchitis, not specified as acute or chronic: Secondary | ICD-10-CM | POA: Diagnosis not present

## 2019-10-29 ENCOUNTER — Other Ambulatory Visit: Payer: Self-pay | Admitting: Cardiology

## 2019-10-29 DIAGNOSIS — I5042 Chronic combined systolic (congestive) and diastolic (congestive) heart failure: Secondary | ICD-10-CM

## 2019-11-01 DIAGNOSIS — I10 Essential (primary) hypertension: Secondary | ICD-10-CM | POA: Diagnosis not present

## 2019-11-01 DIAGNOSIS — I25118 Atherosclerotic heart disease of native coronary artery with other forms of angina pectoris: Secondary | ICD-10-CM | POA: Diagnosis not present

## 2019-11-01 DIAGNOSIS — K5792 Diverticulitis of intestine, part unspecified, without perforation or abscess without bleeding: Secondary | ICD-10-CM | POA: Diagnosis not present

## 2019-11-29 ENCOUNTER — Other Ambulatory Visit: Payer: Self-pay | Admitting: Cardiology

## 2019-11-29 DIAGNOSIS — I5042 Chronic combined systolic (congestive) and diastolic (congestive) heart failure: Secondary | ICD-10-CM

## 2019-11-30 NOTE — Telephone Encounter (Signed)
*  STAT* If patient is at the pharmacy, call can be transferred to refill team.   1. Which medications need to be refilled? (please list name of each medication and dose if known) Entresto  2. Which pharmacy/location (including street and city if local pharmacy) is medication to be sent to? [facility]   3. Do they need a 30 day or 90 day supply? 30

## 2019-12-05 DIAGNOSIS — K5792 Diverticulitis of intestine, part unspecified, without perforation or abscess without bleeding: Secondary | ICD-10-CM | POA: Diagnosis not present

## 2019-12-05 DIAGNOSIS — F419 Anxiety disorder, unspecified: Secondary | ICD-10-CM | POA: Diagnosis not present

## 2019-12-05 DIAGNOSIS — F330 Major depressive disorder, recurrent, mild: Secondary | ICD-10-CM | POA: Diagnosis not present

## 2019-12-05 DIAGNOSIS — I10 Essential (primary) hypertension: Secondary | ICD-10-CM | POA: Diagnosis not present

## 2019-12-07 ENCOUNTER — Other Ambulatory Visit: Payer: Self-pay | Admitting: *Deleted

## 2019-12-07 DIAGNOSIS — I5042 Chronic combined systolic (congestive) and diastolic (congestive) heart failure: Secondary | ICD-10-CM

## 2019-12-07 MED ORDER — ENTRESTO 97-103 MG PO TABS: 1.0000 | ORAL_TABLET | Freq: Two times a day (BID) | ORAL | 2 refills | Status: DC

## 2019-12-09 DIAGNOSIS — K5792 Diverticulitis of intestine, part unspecified, without perforation or abscess without bleeding: Secondary | ICD-10-CM | POA: Diagnosis not present

## 2019-12-09 DIAGNOSIS — K8020 Calculus of gallbladder without cholecystitis without obstruction: Secondary | ICD-10-CM | POA: Diagnosis not present

## 2019-12-16 ENCOUNTER — Ambulatory Visit (INDEPENDENT_AMBULATORY_CARE_PROVIDER_SITE_OTHER): Payer: PPO | Admitting: Cardiology

## 2019-12-16 ENCOUNTER — Encounter: Payer: Self-pay | Admitting: Cardiology

## 2019-12-16 ENCOUNTER — Other Ambulatory Visit: Payer: Self-pay

## 2019-12-16 VITALS — BP 158/82 | HR 67 | Temp 97.7°F | Ht 60.0 in | Wt 132.0 lb

## 2019-12-16 DIAGNOSIS — I5042 Chronic combined systolic (congestive) and diastolic (congestive) heart failure: Secondary | ICD-10-CM | POA: Diagnosis not present

## 2019-12-16 DIAGNOSIS — R002 Palpitations: Secondary | ICD-10-CM | POA: Diagnosis not present

## 2019-12-16 DIAGNOSIS — I25119 Atherosclerotic heart disease of native coronary artery with unspecified angina pectoris: Secondary | ICD-10-CM | POA: Diagnosis not present

## 2019-12-16 DIAGNOSIS — I110 Hypertensive heart disease with heart failure: Secondary | ICD-10-CM | POA: Diagnosis not present

## 2019-12-16 DIAGNOSIS — E782 Mixed hyperlipidemia: Secondary | ICD-10-CM | POA: Diagnosis not present

## 2019-12-16 NOTE — Progress Notes (Signed)
Cardiology Office Note:    Date:  12/16/2019   ID:  Jennifer Martin, DOB 1956-09-14, MRN [id number]  PCP:  [name]., MD  Cardiologist:  [name], MD    Referring MD: [name]., MD    ASSESSMENT:    1. Palpitations   2. Coronary artery disease involving native coronary artery of native heart with angina pectoris (HCC)   3. Chronic combined systolic and diastolic heart failure (HCC)   4. Hypertensive heart disease with heart failure (HCC)   5. Mixed hyperlipidemia    PLAN:    In order of problems listed above:  1. She realizes and I agree that this is predominantly stress has no documented arrhythmia recurrence and continue her current medical treatment she is reassured 2. Stable CAD having no anginal discomfort New York Heart Association class I continue medical therapy including anticoagulant and lipid-lowering treatment 3. Her heart failure is compensated no fluid overload and continue her current diuretic she has done exceptionally well with Entresto and will continue guideline directed treatment with it. 4. BP at target continue current treatment only statin intolerant.  5.  Next visit we will discuss the potential PCSK9 inhibitor with x-ray currently working with residual LDL exceeding 100 and she does not tolerate higher intensity statins. 6. Stable continue her statin   Next appointment: 6 months   Medication Adjustments/Labs and Tests Ordered: Current medicines are reviewed at length with the patient today.  Concerns regarding medicines are outlined above.  Orders Placed This Encounter  Procedures  . EKG 12-Lead   No orders of the defined types were placed in this encounter.   Chief Complaint  Patient presents with  . Palpitations    Seen today as a work him with complaint of palpitation    History of Present Illness:    Jennifer Martin is a 64 y.o. female with a hx of CAD, Heart failure with EF normalized with ARNI ,Dyslipidemia, HTN, PAF, LBBB,  COPD  and  atrial flutter status post atrial fibrillation/flutter EP ablation 05/29/17.     She was last seen 09/29/2019.  Compliance with diet, lifestyle and medications: Yes  Shakeela unfortunately is a great deal of stress and anxiety in her life with her husband's advanced lung cancer having undergone radiation chemo and now immune checkpoint inhibitor.  She finds himself physically and emotionally exhausted with some crying episodes.  Associated with that she has had palpitation but not severe or sustained.  Had no angina syncope shortness of breath edema and her heart rates are in the range of 60 to 70 bpm and she checks at home today shows sinus rhythm left bundle branch block first-degree AV block  Past Medical History:  Diagnosis Date  . Anxiety   . Atherosclerosis of coronary artery of native heart with angina pectoris (HCC)   . Atrial flutter (HCC)   . CAD (coronary artery disease)   . CHF (congestive heart failure) (HCC)   . COPD (chronic obstructive pulmonary disease) (HCC)   . Demand ischemia (HCC)   . Depression   . Difficult intubation    " small airway and need a little tube "  . Diverticulosis   . GERD (gastroesophageal reflux disease)   . Hyperlipidemia   . Hypertension   . Inappropriate ADH syndrome (HCC)   . LBBB (left bundle branch block)   . Migraine    "none since ~ 2012; mild ones then when I did have them because of the beta blockers  I was on" (06/09/2017)  . Myocardial infarction ([place])    "I've had light ones" (06/09/2017)  . On amiodarone therapy 04/13/2017  . Osteoarthritis   . Pneumonia    "couple times" (06/09/2017)    Past Surgical History:  Procedure Laterality Date  . ATRIAL FIBRILLATION ABLATION N/A 05/29/2017   Procedure: Atrial Fibrillation Ablation;  Surgeon: Constance Haw, MD;  Location: [facility];  Service: Cardiovascular;  Laterality: N/A;  . BREAST SURGERY Left    "took a gland out; milk duct"  . CARDIAC CATHETERIZATION  2008    has had 2 procedures, the last one approx 2008, never had PCI/stent. Dr Bettina Gavia  . CARPAL TUNNEL RELEASE Left   . DILATION AND CURETTAGE OF UTERUS     "related to heavy bleeding"  . INGUINAL HERNIA REPAIR Left   . KNEE SURGERY Left 10/2018  . LACRIMAL DUCT EXPLORATION Right 04/01/2018   Procedure: LACRIMAL DUCT EXPLORATION AND ETHMOIDECTOMY;  Surgeon: Clista Bernhardt, MD;  Location: [facility];  Service: Ophthalmology;  Laterality: Right;  . SHOULDER ARTHROSCOPY WITH ROTATOR CUFF REPAIR Right   . TEAR DUCT PROBING Right 04/01/2018   Procedure: TEAR DUCT PROBING WITH STENT;  Surgeon: Clista Bernhardt, MD;  Location: [facility];  Service: Ophthalmology;  Laterality: Right;  . TRACHEOSTOMY  2006   "closed on it's own"  . TUBAL LIGATION    . VAGINAL HYSTERECTOMY     "fibroids"    Current Medications: Current Meds  Medication Sig  . acetaminophen (TYLENOL) 500 MG tablet Take 1,000 mg by mouth 2 (two) times daily as needed for mild pain or headache.  . albuterol (PROVENTIL HFA;VENTOLIN HFA) 108 (90 Base) MCG/ACT inhaler Inhale 2 puffs into the lungs every 6 (six) hours as needed for wheezing or shortness of breath.  Marland Kitchen amoxicillin (AMOXIL) 500 MG capsule Take 500 mg by mouth 3 (three) times daily.  . budesonide-formoterol (SYMBICORT) 160-4.5 MCG/ACT inhaler Inhale 2 puffs into the lungs 2 (two) times daily as needed.   . Calcium Carbonate-Vitamin D (CALCIUM 600+D PO) Take 1 tablet by mouth daily.   Marland Kitchen escitalopram (LEXAPRO) 20 MG tablet TAKE ONE TABLET BY MOUTH ONCE DAILY  . fluticasone (FLONASE) 50 MCG/ACT nasal spray Place 2 sprays into both nostrils daily.   . furosemide (LASIX) 20 MG tablet Take 1 tablet (20 mg total) by mouth 2 (two) times daily. Take only one daily if you weigh 124lbs or less.  Marland Kitchen LORazepam (ATIVAN) 1 MG tablet Take 1 mg by mouth 3 (three) times daily as needed for anxiety.   . nitroGLYCERIN (NITROSTAT) 0.4 MG SL tablet Place 1 tablet (0.4 mg total) under the tongue every 5  (five) minutes as needed for chest pain.  . pantoprazole (PROTONIX) 40 MG tablet Take 40 mg by mouth daily.  . potassium chloride SA (KLOR-CON) 20 MEQ tablet TAKE 1/2 TABLET BY MOUTH TWICE DAILY  . pravastatin (PRAVACHOL) 20 MG tablet Take 10 mg by mouth every other day.  . sacubitril-valsartan (ENTRESTO) 97-103 MG Take 1 tablet by mouth 2 (two) times daily.  . traMADol (ULTRAM) 50 MG tablet Take 50-100 mg by mouth every 6 (six) hours as needed.  Alveda Reasons 20 MG TABS tablet TAKE ONE TABLET BY MOUTH ONCE DAILY     Allergies:   Atorvastatin, Cefdinir, and Levofloxacin   Social History   Socioeconomic History  . Marital status: Married    Spouse name: Not on file  . Number of children: Not on file  .  Years of education: Not on file  . Highest education level: Not on file  Occupational History  . Not on file  Tobacco Use  . Smoking status: Current Every Day Smoker    Packs/day: 0.50    Years: 34.00    Pack years: 17.00    Types: Cigarettes  . Smokeless tobacco: Never Used  Substance and Sexual Activity  . Alcohol use: No  . Drug use: No  . Sexual activity: Not Currently  Other Topics Concern  . Not on file  Social History Narrative  . Not on file   Social Determinants of Health   Financial Resource Strain:   . Difficulty of Paying Living Expenses: Not on file  Food Insecurity:   . Worried About Programme researcher, broadcasting/film/video in the Last Year: Not on file  . Ran Out of Food in the Last Year: Not on file  Transportation Needs:   . Lack of Transportation (Medical): Not on file  . Lack of Transportation (Non-Medical): Not on file  Physical Activity:   . Days of Exercise per Week: Not on file  . Minutes of Exercise per Session: Not on file  Stress:   . Feeling of Stress : Not on file  Social Connections:   . Frequency of Communication with Friends and Family: Not on file  . Frequency of Social Gatherings with Friends and Family: Not on file  . Attends Religious Services: Not on  file  . Active Member of Clubs or Organizations: Not on file  . Attends Banker Meetings: Not on file  . Marital Status: Not on file     Family History: The patient's family history includes CAD in her brother; Heart disease in her brother. ROS:   Please see the history of present illness.    All other systems reviewed and are negative.  EKGs/Labs/Other Studies Reviewed:    The following studies were reviewed today:  EKG:  EKG ordered today and personally reviewed.  The ekg ordered today demonstrates sinus rhythm left bundle branch block first-degree AV block  Recent Labs: 11/01/2019: K 4.1, Cr 0.42, normal LFT's  Hgb 12.7  09/22/2019: Chol 194, TG 153, HDL 62 LDL 129   02/21/2019: ALT 10; BUN 10; Creatinine, Ser 0.57; NT-Pro BNP 203; Potassium 4.5; Sodium 131  Recent Lipid Panel    Component Value Date/Time   CHOL 166 02/21/2019 0957   TRIG 86 02/21/2019 0957   HDL 65 02/21/2019 0957   CHOLHDL 2.6 02/21/2019 0957   CHOLHDL 3.2 06/11/2017 1439   VLDL 20 06/11/2017 1439   LDLCALC 84 02/21/2019 0957    Physical Exam:    VS:  BP (!) 158/82   Pulse 67   Temp 97.7 F (36.5 C)   Ht 5' (1.524 m)   Wt 132 lb (59.9 kg)   SpO2 99%   BMI 25.78 kg/m     Wt Readings from Last 3 Encounters:  12/16/19 132 lb (59.9 kg)  09/29/19 134 lb 9.6 oz (61.1 kg)  08/29/19 133 lb (60.3 kg)     GEN:  Well nourished, well developed in no acute distress she is little bit tearful when she talks about her husband's illness HEENT: Normal NECK: No JVD; No carotid bruits LYMPHATICS: No lymphadenopathy CARDIAC: RRR, no murmurs, rubs, gallops RESPIRATORY:  Clear to auscultation without rales, wheezing or rhonchi  ABDOMEN: Soft, non-tender, non-distended MUSCULOSKELETAL:  No edema; No deformity  SKIN: Warm and dry NEUROLOGIC:  Alert and oriented x 3 PSYCHIATRIC:  Normal affect    Signed, [name], MD  12/16/2019 11:12 AM    [facility]

## 2019-12-16 NOTE — Patient Instructions (Signed)

## 2020-03-05 ENCOUNTER — Other Ambulatory Visit: Payer: Self-pay | Admitting: Cardiology

## 2020-03-05 DIAGNOSIS — I5043 Acute on chronic combined systolic (congestive) and diastolic (congestive) heart failure: Secondary | ICD-10-CM

## 2020-03-22 DIAGNOSIS — E782 Mixed hyperlipidemia: Secondary | ICD-10-CM | POA: Diagnosis not present

## 2020-03-22 DIAGNOSIS — F419 Anxiety disorder, unspecified: Secondary | ICD-10-CM | POA: Diagnosis not present

## 2020-03-22 DIAGNOSIS — R5383 Other fatigue: Secondary | ICD-10-CM | POA: Diagnosis not present

## 2020-03-22 DIAGNOSIS — I25118 Atherosclerotic heart disease of native coronary artery with other forms of angina pectoris: Secondary | ICD-10-CM | POA: Diagnosis not present

## 2020-03-22 DIAGNOSIS — E222 Syndrome of inappropriate secretion of antidiuretic hormone: Secondary | ICD-10-CM | POA: Diagnosis not present

## 2020-03-22 DIAGNOSIS — J441 Chronic obstructive pulmonary disease with (acute) exacerbation: Secondary | ICD-10-CM | POA: Diagnosis not present

## 2020-03-22 DIAGNOSIS — I5022 Chronic systolic (congestive) heart failure: Secondary | ICD-10-CM | POA: Diagnosis not present

## 2020-03-22 DIAGNOSIS — K8020 Calculus of gallbladder without cholecystitis without obstruction: Secondary | ICD-10-CM | POA: Insufficient documentation

## 2020-03-22 DIAGNOSIS — Z79899 Other long term (current) drug therapy: Secondary | ICD-10-CM | POA: Diagnosis not present

## 2020-03-22 DIAGNOSIS — M8949 Other hypertrophic osteoarthropathy, multiple sites: Secondary | ICD-10-CM | POA: Diagnosis not present

## 2020-03-22 DIAGNOSIS — I110 Hypertensive heart disease with heart failure: Secondary | ICD-10-CM | POA: Diagnosis not present

## 2020-03-22 DIAGNOSIS — N6011 Diffuse cystic mastopathy of right breast: Secondary | ICD-10-CM | POA: Diagnosis not present

## 2020-03-22 DIAGNOSIS — R5381 Other malaise: Secondary | ICD-10-CM | POA: Diagnosis not present

## 2020-03-22 DIAGNOSIS — M1A09X0 Idiopathic chronic gout, multiple sites, without tophus (tophi): Secondary | ICD-10-CM | POA: Diagnosis not present

## 2020-03-22 DIAGNOSIS — G720 Drug-induced myopathy: Secondary | ICD-10-CM | POA: Diagnosis not present

## 2020-03-22 DIAGNOSIS — Z8679 Personal history of other diseases of the circulatory system: Secondary | ICD-10-CM | POA: Diagnosis not present

## 2020-03-22 HISTORY — DX: Calculus of gallbladder without cholecystitis without obstruction: K80.20

## 2020-03-23 DIAGNOSIS — Z1231 Encounter for screening mammogram for malignant neoplasm of breast: Secondary | ICD-10-CM | POA: Diagnosis not present

## 2020-05-08 ENCOUNTER — Telehealth: Payer: Self-pay

## 2020-05-08 DIAGNOSIS — N6019 Diffuse cystic mastopathy of unspecified breast: Secondary | ICD-10-CM | POA: Diagnosis not present

## 2020-05-08 DIAGNOSIS — K5730 Diverticulosis of large intestine without perforation or abscess without bleeding: Secondary | ICD-10-CM

## 2020-05-08 DIAGNOSIS — Z1211 Encounter for screening for malignant neoplasm of colon: Secondary | ICD-10-CM | POA: Diagnosis not present

## 2020-05-08 HISTORY — DX: Diverticulosis of large intestine without perforation or abscess without bleeding: K57.30

## 2020-05-08 NOTE — Telephone Encounter (Signed)
Patient with diagnosis of afib on Xarelto for anticoagulation.    Procedure: Colonoscopy  Date of procedure: 06/21/20  CHADS2-VASc score of  5 (CHF, HTN, AGE, CAD, female)  CrCl 77 ml/min  Per office protocol, patient can hold Xarelto for 1-2 days prior to procedure.

## 2020-05-08 NOTE — Telephone Encounter (Signed)
   [facility] Pre-operative Risk Assessment    HEARTCARE STAFF: - Please ensure there is not already an duplicate clearance open for this procedure. - Under Visit Info/Reason for Call, type in Other and utilize the format Clearance MM/DD/YY or Clearance TBD. Do not use dashes or single digits. - If request is for dental extraction, please clarify the # of teeth to be extracted.  Request for surgical clearance:  1. What type of surgery is being performed? Colonoscopy   2. When is this surgery scheduled? 06/21/20   3. What type of clearance is required (medical clearance vs. Pharmacy clearance to hold med vs. Both)? Both  4. Are there any medications that need to be held prior to surgery and how long? Xarelto for 7 days   5. Practice name and name of physician performing surgery? [facility]   6. What is the office phone number? [phone]   7.   What is the office fax number? [phone]  8.   Anesthesia type (None, local, MAC, general) ? Unspecified   Gita Kudo 05/08/2020, 1:32 PM  _________________________________________________________________   (provider comments below)

## 2020-05-09 NOTE — Telephone Encounter (Signed)
Primary Cardiologist:Brian [name], MD  Chart reviewed as part of pre-operative protocol coverage. Because of Jennifer Martin's past medical history and time since last visit, he/she will require a follow-up visit in order to better assess preoperative cardiovascular risk.  Pre-op covering staff: - Please schedule appointment and call patient to inform them. - Please contact requesting surgeon's office via preferred method (i.e, phone, fax) to inform them of need for appointment prior to surgery.  If applicable, this message will also be routed to pharmacy pool and/or primary cardiologist for input on holding anticoagulant/antiplatelet agent as requested below so that this information is available at time of patient's appointment.   Ronney Asters, NP  05/09/2020, 8:13 AM

## 2020-05-09 NOTE — Telephone Encounter (Signed)
Pt has appt scheduled with Dr [name] on 05/14/2020 @ 11:20 AM, will address pre-op clearance at that time.  Will forward to requesting party

## 2020-05-13 NOTE — Progress Notes (Signed)
Cardiology Office Note:    Date:  05/14/2020   ID:  [name], DOB 03-Jun-1956, MRN [id number]  PCP:  [name]., MD  Cardiologist:  [name], MD    Referring MD: [name]., MD    ASSESSMENT:    1. Chronic combined systolic and diastolic heart failure (HCC)   2. Hypertensive heart disease with heart failure (HCC)   3. Paroxysmal atrial fibrillation (HCC)   4. Chronic anticoagulation   5. Coronary artery disease involving native coronary artery of native heart with angina pectoris (HCC)   6. Statin myopathy    PLAN:    In order of problems listed above:  1. In general she is done well her heart failure is compensated New York Heart Association class I she has no edema we'll continue her current loop diuretic and recheck echocardiogram and guideline directed treatment including Entresto. 2. BP at target continue current treatment check echocardiogram 3. Maintaining sinus rhythm after pulmonary vein isolation off amiodarone 4. Continue anticoagulant she'll hold 2 days before and 2 days after colonoscopy 5. Stable CAD New York Heart Association class I continue medical therapy 6. Start Zetia if she remains above target LDL greater than 7200 PCSK9 therapy is indicated high risk patient   Next appointment: 6 months   Medication Adjustments/Labs and Tests Ordered: Current medicines are reviewed at length with the patient today.  Concerns regarding medicines are outlined above.  No orders of the defined types were placed in this encounter.  No orders of the defined types were placed in this encounter.   No chief complaint on file.   History of Present Illness:    Jennifer Martin is a 64 y.o. female with a hx of  CAD, Heart failure with EF normalized with ARNI ,Dyslipidemia, HTN, PAF, LBBB, COPD  and  atrial flutter status post atrial fibrillation/flutter EP ablation 05/29/17. She is pending colonoscopy.  She was last seen 12/16/2019. Compliance with diet,  lifestyle and medications: Yes  In general is done well and is pending colonoscopy.  She is maintaining sinus rhythm by EKG and she'll hold her anticoagulant to full days prior and to after planned colonoscopy procedure.  She has some palpitation but is brief momentary not frequent not sustained and has had no recurrent atrial fibrillation.  Tolerates her anticoagulant without bleeding.  Unfortunately she is intolerant of any dose of any statin high or low intensity she takes over-the-counter fish oil I'll put her on Zetia and if her lipids remain above target PCSK9 therapy is appropriate.  Her COPD is stable and she is not short of breath. Past Medical History:  Diagnosis Date  . Anxiety   . Atherosclerosis of coronary artery of native heart with angina pectoris (HCC)   . Atrial flutter (HCC)   . CAD (coronary artery disease)   . CHF (congestive heart failure) (HCC)   . COPD (chronic obstructive pulmonary disease) (HCC)   . Demand ischemia (HCC)   . Depression   . Difficult intubation    " small airway and need a little tube "  . Diverticulosis   . GERD (gastroesophageal reflux disease)   . Hyperlipidemia   . Hypertension   . Inappropriate ADH syndrome (HCC)   . LBBB (left bundle branch block)   . Migraine    "none since ~ 2012; mild ones then when I did have them because of the beta blockers I was on" (06/09/2017)  . Myocardial infarction (HCC)    "I've had  light ones" (06/09/2017)  . On amiodarone therapy 04/13/2017  . Osteoarthritis   . Pneumonia    "couple times" (06/09/2017)    Past Surgical History:  Procedure Laterality Date  . ATRIAL FIBRILLATION ABLATION N/A 05/29/2017   Procedure: Atrial Fibrillation Ablation;  Surgeon: Regan Lemming, MD;  Location: [facility];  Service: Cardiovascular;  Laterality: N/A;  . BREAST SURGERY Left    "took a gland out; milk duct"  . CARDIAC CATHETERIZATION  2008   has had 2 procedures, the last one approx 2008, never had  PCI/stent. Dr [name]  . CARPAL TUNNEL RELEASE Left   . DILATION AND CURETTAGE OF UTERUS     "related to heavy bleeding"  . INGUINAL HERNIA REPAIR Left   . KNEE SURGERY Left 10/2018  . LACRIMAL DUCT EXPLORATION Right 04/01/2018   Procedure: LACRIMAL DUCT EXPLORATION AND ETHMOIDECTOMY;  Surgeon: Floydene Flock, MD;  Location: [facility];  Service: Ophthalmology;  Laterality: Right;  . SHOULDER ARTHROSCOPY WITH ROTATOR CUFF REPAIR Right   . TEAR DUCT PROBING Right 04/01/2018   Procedure: TEAR DUCT PROBING WITH STENT;  Surgeon: Floydene Flock, MD;  Location: [facility];  Service: Ophthalmology;  Laterality: Right;  . TRACHEOSTOMY  2006   "closed on it's own"  . TUBAL LIGATION    . VAGINAL HYSTERECTOMY     "fibroids"    Current Medications: Current Meds  Medication Sig  . acetaminophen (TYLENOL) 500 MG tablet Take 1,000 mg by mouth 2 (two) times daily as needed for mild pain or headache.  . albuterol (PROVENTIL HFA;VENTOLIN HFA) 108 (90 Base) MCG/ACT inhaler Inhale 2 puffs into the lungs every 6 (six) hours as needed for wheezing or shortness of breath.  . budesonide-formoterol (SYMBICORT) 160-4.5 MCG/ACT inhaler Inhale 2 puffs into the lungs in the morning and at bedtime.   . Calcium Carbonate-Vitamin D (CALCIUM 600+D PO) Take 1 tablet by mouth daily.   Marland Kitchen escitalopram (LEXAPRO) 20 MG tablet TAKE ONE TABLET BY MOUTH ONCE DAILY  . fluticasone (FLONASE) 50 MCG/ACT nasal spray Place 2 sprays into both nostrils daily.   . furosemide (LASIX) 20 MG tablet Take 1 tablet (20 mg total) by mouth 2 (two) times daily. Take only one daily if you weigh 124lbs or less.  Marland Kitchen LORazepam (ATIVAN) 1 MG tablet Take 1 mg by mouth 3 (three) times daily as needed for anxiety.   . nitroGLYCERIN (NITROSTAT) 0.4 MG SL tablet Place 1 tablet (0.4 mg total) under the tongue every 5 (five) minutes as needed for chest pain.  . pantoprazole (PROTONIX) 40 MG tablet Take 40 mg by mouth daily.  . potassium chloride SA (KLOR-CON) 20  MEQ tablet TAKE 1/2 TABLET BY MOUTH TWICE DAILY  . sacubitril-valsartan (ENTRESTO) 97-103 MG Take 1 tablet by mouth 2 (two) times daily.  . traMADol (ULTRAM) 50 MG tablet Take 50-100 mg by mouth every 6 (six) hours as needed.  Carlena Hurl 20 MG TABS tablet TAKE ONE TABLET BY MOUTH ONCE DAILY     Allergies:   Atorvastatin, Cefdinir, and Levofloxacin   Social History   Socioeconomic History  . Marital status: Married    Spouse name: Not on file  . Number of children: Not on file  . Years of education: Not on file  . Highest education level: Not on file  Occupational History  . Not on file  Tobacco Use  . Smoking status: Current Every Day Smoker    Packs/day: 0.50    Years: 34.00  Pack years: 17.00    Types: Cigarettes  . Smokeless tobacco: Never Used  Vaping Use  . Vaping Use: Former  . Quit date: 03/23/2004  Substance and Sexual Activity  . Alcohol use: No  . Drug use: No  . Sexual activity: Not Currently  Other Topics Concern  . Not on file  Social History Narrative  . Not on file   Social Determinants of Health   Financial Resource Strain:   . Difficulty of Paying Living Expenses:   Food Insecurity:   . Worried About Programme researcher, broadcasting/film/video in the Last Year:   . Barista in the Last Year:   Transportation Needs:   . Freight forwarder (Medical):   Marland Kitchen Lack of Transportation (Non-Medical):   Physical Activity:   . Days of Exercise per Week:   . Minutes of Exercise per Session:   Stress:   . Feeling of Stress :   Social Connections:   . Frequency of Communication with Friends and Family:   . Frequency of Social Gatherings with Friends and Family:   . Attends Religious Services:   . Active Member of Clubs or Organizations:   . Attends Banker Meetings:   Marland Kitchen Marital Status:      Family History: The patient's family history includes CAD in her brother; Heart disease in her brother. ROS:   Please see the history of present illness.    All  other systems reviewed and are negative.  EKGs/Labs/Other Studies Reviewed:    The following studies were reviewed today:  EKG:  EKG ordered today and personally reviewed.  The ekg ordered today demonstrates sinus rhythm left bundle branch block occasional APCs  Recent Labs: 03/22/2010 [facility]: CMP shows a sodium 133 potassium 4.1 creatinine 0.52 normal GFR and liver function test  Lipid profile cholesterol 197 LDL 116 triglycerides ninety-five HDL sixty-four TSH normal 3.75 CBC normal hemoglobin 13.3 recent Lipid Panel    Component Value Date/Time   CHOL 166 02/21/2019 0957   TRIG 86 02/21/2019 0957   HDL 65 02/21/2019 0957   CHOLHDL 2.6 02/21/2019 0957   CHOLHDL 3.2 06/11/2017 1439   VLDL 20 06/11/2017 1439   LDLCALC 84 02/21/2019 0957    Physical Exam:    VS:  BP (!) 126/62 (BP Location: Right Arm, Patient Position: Sitting)   Pulse 75   Ht 5' (1.524 m)   Wt 134 lb 12.8 oz (61.1 kg)   SpO2 97%   BMI 26.33 kg/m     Wt Readings from Last 3 Encounters:  05/14/20 134 lb 12.8 oz (61.1 kg)  12/16/19 132 lb (59.9 kg)  09/29/19 134 lb 9.6 oz (61.1 kg)     GEN:  Well nourished, well developed in no acute distress HEENT: Normal NECK: No JVD; No carotid bruits LYMPHATICS: No lymphadenopathy CARDIAC: RRR, no murmurs, rubs, gallops RESPIRATORY:  Clear to auscultation without rales, wheezing or rhonchi  ABDOMEN: Soft, non-tender, non-distended MUSCULOSKELETAL:  No edema; No deformity  SKIN: Warm and dry NEUROLOGIC:  Alert and oriented x 3 PSYCHIATRIC:  Normal affect    Signed, [name], MD  05/14/2020 11:58 AM    [facility]

## 2020-05-14 ENCOUNTER — Ambulatory Visit: Payer: PPO | Admitting: Cardiology

## 2020-05-14 ENCOUNTER — Other Ambulatory Visit: Payer: Self-pay

## 2020-05-14 ENCOUNTER — Encounter: Payer: Self-pay | Admitting: Cardiology

## 2020-05-14 VITALS — BP 126/62 | HR 75 | Ht 60.0 in | Wt 134.8 lb

## 2020-05-14 DIAGNOSIS — I480 Paroxysmal atrial fibrillation: Secondary | ICD-10-CM

## 2020-05-14 DIAGNOSIS — I25119 Atherosclerotic heart disease of native coronary artery with unspecified angina pectoris: Secondary | ICD-10-CM

## 2020-05-14 DIAGNOSIS — G720 Drug-induced myopathy: Secondary | ICD-10-CM

## 2020-05-14 DIAGNOSIS — I110 Hypertensive heart disease with heart failure: Secondary | ICD-10-CM | POA: Diagnosis not present

## 2020-05-14 DIAGNOSIS — Z7901 Long term (current) use of anticoagulants: Secondary | ICD-10-CM | POA: Diagnosis not present

## 2020-05-14 DIAGNOSIS — I5042 Chronic combined systolic (congestive) and diastolic (congestive) heart failure: Secondary | ICD-10-CM

## 2020-05-14 DIAGNOSIS — T466X5A Adverse effect of antihyperlipidemic and antiarteriosclerotic drugs, initial encounter: Secondary | ICD-10-CM

## 2020-05-14 MED ORDER — EZETIMIBE 10 MG PO TABS: 10.0000 mg | ORAL_TABLET | Freq: Every day | ORAL | 3 refills | Status: DC

## 2020-05-14 NOTE — Addendum Note (Signed)
Addended by: Delorse Limber I on: 05/14/2020 03:58 PM   Modules accepted: Orders

## 2020-05-14 NOTE — Patient Instructions (Addendum)
Medication Instructions:  Your physician has recommended you make the following change in your medication:  START: Zetia 10 mg take one tablet by mouth daily.  *If you need a refill on your cardiac medications before your next appointment, please call your pharmacy*   Lab Work: Your physician recommends that you return for lab work in: 6 weeks Lipids If you have labs (blood work) drawn today and your tests are completely normal, you will receive your results only by: Marland Kitchen MyChart Message (if you have MyChart) OR . A paper copy in the mail If you have any lab test that is abnormal or we need to change your treatment, we will call you to review the results.   Testing/Procedures: Your physician has requested that you have an echocardiogram. Echocardiography is a painless test that uses sound waves to create images of your heart. It provides your doctor with information about the size and shape of your heart and how well your heart's chambers and valves are working. This procedure takes approximately one hour. There are no restrictions for this procedure.     Follow-Up: At [facility], you and your health needs are our priority.  As part of our continuing mission to provide you with exceptional heart care, we have created designated Provider Care Teams.  These Care Teams include your primary Cardiologist (physician) and Advanced Practice Providers (APPs -  Physician Assistants and Nurse Practitioners) who all work together to provide you with the care you need, when you need it.  We recommend signing up for the patient portal called "MyChart".  Sign up information is provided on this After Visit Summary.  MyChart is used to connect with patients for Virtual Visits (Telemedicine).  Patients are able to view lab/test results, encounter notes, upcoming appointments, etc.  Non-urgent messages can be sent to your provider as well.   To learn more about what you can do with MyChart, go to  [email or web address].    Your next appointment:   6 month(s)  The format for your next appointment:   In Person  Provider:   [name], MD   Other Instructions Please miss two doses of your Xarelto prior and post your colonoscopy

## 2020-06-04 ENCOUNTER — Ambulatory Visit (INDEPENDENT_AMBULATORY_CARE_PROVIDER_SITE_OTHER): Payer: PPO

## 2020-06-04 ENCOUNTER — Other Ambulatory Visit: Payer: Self-pay

## 2020-06-04 DIAGNOSIS — I5042 Chronic combined systolic (congestive) and diastolic (congestive) heart failure: Secondary | ICD-10-CM | POA: Diagnosis not present

## 2020-06-04 LAB — ECHOCARDIOGRAM COMPLETE
Area-P 1/2: 4.49 cm2
Calc EF: 45 %
P 1/2 time: 367 msec
Single Plane A2C EF: 45.4 %
Single Plane A4C EF: 44 %

## 2020-06-04 NOTE — Progress Notes (Signed)
Complete echocardiogram performed.  Jimmy Kaniah Rizzolo RDCS, RVT  

## 2020-06-05 ENCOUNTER — Telehealth: Payer: Self-pay

## 2020-06-05 NOTE — Telephone Encounter (Signed)
Spoke with patient regarding results and recommendation.  Patient verbalizes understanding and is agreeable to plan of care. Advised patient to call back with any issues or concerns.  

## 2020-07-05 ENCOUNTER — Other Ambulatory Visit: Payer: Self-pay | Admitting: Cardiology

## 2020-07-05 DIAGNOSIS — I5043 Acute on chronic combined systolic (congestive) and diastolic (congestive) heart failure: Secondary | ICD-10-CM

## 2020-07-05 DIAGNOSIS — I25119 Atherosclerotic heart disease of native coronary artery with unspecified angina pectoris: Secondary | ICD-10-CM | POA: Diagnosis not present

## 2020-07-05 DIAGNOSIS — G720 Drug-induced myopathy: Secondary | ICD-10-CM | POA: Diagnosis not present

## 2020-07-05 DIAGNOSIS — T466X5A Adverse effect of antihyperlipidemic and antiarteriosclerotic drugs, initial encounter: Secondary | ICD-10-CM | POA: Diagnosis not present

## 2020-07-06 LAB — LIPID PANEL
Chol/HDL Ratio: 2.6 ratio (ref 0.0–4.4)
Cholesterol, Total: 170 mg/dL (ref 100–199)
HDL: 65 mg/dL (ref >39)
LDL Chol Calc (NIH): 89 mg/dL (ref 0–99)
Triglycerides: 88 mg/dL (ref 0–149)
VLDL Cholesterol Cal: 16 mg/dL (ref 5–40)

## 2020-07-10 ENCOUNTER — Telehealth: Payer: Self-pay | Admitting: Cardiology

## 2020-07-10 NOTE — Telephone Encounter (Signed)
° °  Pt is calling to get lab result °

## 2020-07-10 NOTE — Telephone Encounter (Signed)
Patient informed of results.  

## 2020-08-01 DIAGNOSIS — Z23 Encounter for immunization: Secondary | ICD-10-CM | POA: Diagnosis not present

## 2020-08-14 DIAGNOSIS — J441 Chronic obstructive pulmonary disease with (acute) exacerbation: Secondary | ICD-10-CM | POA: Diagnosis not present

## 2020-09-17 ENCOUNTER — Other Ambulatory Visit: Payer: Self-pay | Admitting: Cardiology

## 2020-09-17 DIAGNOSIS — I4892 Unspecified atrial flutter: Secondary | ICD-10-CM

## 2020-09-17 DIAGNOSIS — I25118 Atherosclerotic heart disease of native coronary artery with other forms of angina pectoris: Secondary | ICD-10-CM

## 2020-09-17 DIAGNOSIS — I480 Paroxysmal atrial fibrillation: Secondary | ICD-10-CM

## 2020-09-24 DIAGNOSIS — I340 Nonrheumatic mitral (valve) insufficiency: Secondary | ICD-10-CM | POA: Diagnosis not present

## 2020-09-24 DIAGNOSIS — Z888 Allergy status to other drugs, medicaments and biological substances status: Secondary | ICD-10-CM | POA: Diagnosis not present

## 2020-09-24 DIAGNOSIS — G720 Drug-induced myopathy: Secondary | ICD-10-CM | POA: Diagnosis not present

## 2020-09-24 DIAGNOSIS — M8949 Other hypertrophic osteoarthropathy, multiple sites: Secondary | ICD-10-CM | POA: Diagnosis not present

## 2020-09-24 DIAGNOSIS — E782 Mixed hyperlipidemia: Secondary | ICD-10-CM | POA: Diagnosis not present

## 2020-09-24 DIAGNOSIS — G479 Sleep disorder, unspecified: Secondary | ICD-10-CM | POA: Diagnosis not present

## 2020-09-24 DIAGNOSIS — F330 Major depressive disorder, recurrent, mild: Secondary | ICD-10-CM | POA: Diagnosis not present

## 2020-09-24 DIAGNOSIS — F419 Anxiety disorder, unspecified: Secondary | ICD-10-CM | POA: Diagnosis not present

## 2020-09-24 DIAGNOSIS — N6012 Diffuse cystic mastopathy of left breast: Secondary | ICD-10-CM | POA: Diagnosis not present

## 2020-09-24 DIAGNOSIS — F17200 Nicotine dependence, unspecified, uncomplicated: Secondary | ICD-10-CM | POA: Diagnosis not present

## 2020-09-24 DIAGNOSIS — I4581 Long QT syndrome: Secondary | ICD-10-CM | POA: Diagnosis not present

## 2020-09-24 DIAGNOSIS — E222 Syndrome of inappropriate secretion of antidiuretic hormone: Secondary | ICD-10-CM | POA: Diagnosis not present

## 2020-09-24 DIAGNOSIS — Z7901 Long term (current) use of anticoagulants: Secondary | ICD-10-CM | POA: Diagnosis not present

## 2020-09-24 DIAGNOSIS — Z0000 Encounter for general adult medical examination without abnormal findings: Secondary | ICD-10-CM | POA: Diagnosis not present

## 2020-09-24 DIAGNOSIS — I5022 Chronic systolic (congestive) heart failure: Secondary | ICD-10-CM | POA: Diagnosis not present

## 2020-09-24 DIAGNOSIS — J42 Unspecified chronic bronchitis: Secondary | ICD-10-CM | POA: Diagnosis not present

## 2020-09-24 DIAGNOSIS — Z79899 Other long term (current) drug therapy: Secondary | ICD-10-CM | POA: Diagnosis not present

## 2020-09-24 DIAGNOSIS — I110 Hypertensive heart disease with heart failure: Secondary | ICD-10-CM | POA: Diagnosis not present

## 2020-09-24 DIAGNOSIS — Z0001 Encounter for general adult medical examination with abnormal findings: Secondary | ICD-10-CM | POA: Diagnosis not present

## 2020-09-24 DIAGNOSIS — I25118 Atherosclerotic heart disease of native coronary artery with other forms of angina pectoris: Secondary | ICD-10-CM | POA: Diagnosis not present

## 2020-09-24 DIAGNOSIS — K8020 Calculus of gallbladder without cholecystitis without obstruction: Secondary | ICD-10-CM | POA: Diagnosis not present

## 2020-09-24 DIAGNOSIS — K5730 Diverticulosis of large intestine without perforation or abscess without bleeding: Secondary | ICD-10-CM | POA: Diagnosis not present

## 2020-09-24 DIAGNOSIS — I447 Left bundle-branch block, unspecified: Secondary | ICD-10-CM | POA: Diagnosis not present

## 2020-09-24 DIAGNOSIS — M1A09X0 Idiopathic chronic gout, multiple sites, without tophus (tophi): Secondary | ICD-10-CM | POA: Diagnosis not present

## 2020-09-24 DIAGNOSIS — M159 Polyosteoarthritis, unspecified: Secondary | ICD-10-CM | POA: Diagnosis not present

## 2020-09-24 DIAGNOSIS — N6011 Diffuse cystic mastopathy of right breast: Secondary | ICD-10-CM | POA: Diagnosis not present

## 2020-09-24 DIAGNOSIS — Z8679 Personal history of other diseases of the circulatory system: Secondary | ICD-10-CM | POA: Diagnosis not present

## 2020-10-03 ENCOUNTER — Other Ambulatory Visit: Payer: Self-pay | Admitting: Cardiology

## 2020-10-03 DIAGNOSIS — I5043 Acute on chronic combined systolic (congestive) and diastolic (congestive) heart failure: Secondary | ICD-10-CM

## 2020-10-24 ENCOUNTER — Other Ambulatory Visit: Payer: Self-pay

## 2020-10-24 DIAGNOSIS — I5042 Chronic combined systolic (congestive) and diastolic (congestive) heart failure: Secondary | ICD-10-CM

## 2020-10-24 MED ORDER — ENTRESTO 97-103 MG PO TABS: 1.0000 | ORAL_TABLET | Freq: Two times a day (BID) | ORAL | 1 refills | Status: DC

## 2020-10-24 NOTE — Telephone Encounter (Signed)
Refill sent to [facility] for Entresto [id number].

## 2020-11-05 DIAGNOSIS — I219 Acute myocardial infarction, unspecified: Secondary | ICD-10-CM | POA: Insufficient documentation

## 2020-11-05 DIAGNOSIS — M1990 Unspecified osteoarthritis, unspecified site: Secondary | ICD-10-CM | POA: Insufficient documentation

## 2020-11-05 DIAGNOSIS — J189 Pneumonia, unspecified organism: Secondary | ICD-10-CM | POA: Insufficient documentation

## 2020-11-05 DIAGNOSIS — I509 Heart failure, unspecified: Secondary | ICD-10-CM | POA: Insufficient documentation

## 2020-11-05 DIAGNOSIS — I2510 Atherosclerotic heart disease of native coronary artery without angina pectoris: Secondary | ICD-10-CM | POA: Insufficient documentation

## 2020-11-05 DIAGNOSIS — G43909 Migraine, unspecified, not intractable, without status migrainosus: Secondary | ICD-10-CM | POA: Insufficient documentation

## 2020-11-05 DIAGNOSIS — I25119 Atherosclerotic heart disease of native coronary artery with unspecified angina pectoris: Secondary | ICD-10-CM | POA: Insufficient documentation

## 2020-11-05 DIAGNOSIS — T884XXA Failed or difficult intubation, initial encounter: Secondary | ICD-10-CM | POA: Insufficient documentation

## 2020-11-05 DIAGNOSIS — I447 Left bundle-branch block, unspecified: Secondary | ICD-10-CM | POA: Insufficient documentation

## 2020-11-05 DIAGNOSIS — F32A Depression, unspecified: Secondary | ICD-10-CM | POA: Insufficient documentation

## 2020-11-05 DIAGNOSIS — I10 Essential (primary) hypertension: Secondary | ICD-10-CM | POA: Insufficient documentation

## 2020-11-05 DIAGNOSIS — I248 Other forms of acute ischemic heart disease: Secondary | ICD-10-CM | POA: Insufficient documentation

## 2020-11-05 DIAGNOSIS — K219 Gastro-esophageal reflux disease without esophagitis: Secondary | ICD-10-CM | POA: Insufficient documentation

## 2020-11-05 DIAGNOSIS — J449 Chronic obstructive pulmonary disease, unspecified: Secondary | ICD-10-CM | POA: Insufficient documentation

## 2020-11-05 DIAGNOSIS — K5790 Diverticulosis of intestine, part unspecified, without perforation or abscess without bleeding: Secondary | ICD-10-CM | POA: Insufficient documentation

## 2020-11-09 NOTE — Progress Notes (Signed)
Cardiology Office Note:    Date:  11/12/2020   ID:  AMAN BATLEY, DOB 1955/12/14, MRN [id number]  PCP:  [name]., MD  Cardiologist:  [name], MD    Referring MD: [name]., MD    ASSESSMENT:    1. Paroxysmal atrial fibrillation (HCC)   2. Chronic anticoagulation   3. Hypertensive heart disease with heart failure (HCC)   4. Coronary artery disease involving native coronary artery of native heart with angina pectoris (HCC)   5. Mixed hyperlipidemia   6. Statin myopathy   7. Chronic obstructive pulmonary disease, unspecified COPD type (HCC)    PLAN:    In order of problems listed above:  1. She has done quite well since EP catheter ablation maintaining sinus rhythm off antiarrhythmic drug but remains anticoagulated. 2. Stable continue guideline directed therapy with her heart failure including high-dose Entresto and her loop diuretic.  She is not on beta-blocker because of COPD 3. Stable continue Zetia I would not rechallenge her with a statin if she required a second treatment PCSK9 inhibitor would be appropriate 4. Myopathy is resolved 5. Stable COPD off beta-blocker   Next appointment: 6 months   Medication Adjustments/Labs and Tests Ordered: Current medicines are reviewed at length with the patient today.  Concerns regarding medicines are outlined above.  No orders of the defined types were placed in this encounter.  No orders of the defined types were placed in this encounter.   No chief complaint on file.   History of Present Illness:    Jennifer Martin is a 65 y.o. female with a hx of CAD heart failure with normalization of ejection fraction on guideline directed therapy, hypertensive heart disease paroxysmal atrial fibrillation and flutter status post EP ablation in 2018 hyperlipidemia COPD and left bundle branch block last seen 05/14/2020. Compliance with diet, lifestyle and medications: Yes  She is improved off of the statin myopathy is  resolved Weight stable no edema shortness of breath orthopnea chest pain palpitation or syncope. She request I will draw labs today and I will do them and she will be seeing her PCP this afternoon  Echocardiogram 06/14/2020 shows low normal ejection fraction 50 to 55% elevated filling pressures mild concentric LVH.  Most recent labs 09/24/2020 hemoglobin at 13.2 platelets 310,000 TSH normal 1.19 lipid profile with a total cholesterol 229 LDL 122 triglycerides 200 HDL 54 hemoglobin A1c 5.1% CMP with potassium 3.5 creatinine 0.49 GFR greater than 90 cc and normal liver function test  Past Medical History:  Diagnosis Date  . Acute pulmonary edema (HCC)   . Anxiety   . Aortic regurgitation 06/17/2017   Mild to moderate   . Atherosclerosis of coronary artery of native heart with angina pectoris (HCC)   . Atrial flutter (HCC)   . CAD (coronary artery disease)   . CHF (congestive heart failure) (HCC)   . Chronic anticoagulation 04/13/2017  . Chronic combined systolic and diastolic heart failure (HCC) 04/13/2017   varying EF in the past - 45-50% in 09/2016, 37% by echo 08/2016,  61% by nuc 01/2017 then 30-35% this admission (40% by review from Dr. Mayford Knife)  . Chronic obstructive pulmonary disease (HCC) 10/10/2016  . COPD (chronic obstructive pulmonary disease) (HCC)   . Coronary artery disease involving native coronary artery of native heart with angina pectoris (HCC) 03/25/2017   Overview:  CTO of LCF Lexiscan MPS 07/28/13 with no ischemia, EF 50%  . Demand ischemia (HCC)   . Depression   .  Difficult intubation    " small airway and need a little tube "  . Diverticulosis   . GERD (gastroesophageal reflux disease)   . Gout 05/12/2016   Last Assessment & Plan:  She needs her uric acid level checked   . High risk medication use 05/12/2016  . History of atrial fibrillation 06/28/2015   Ablation 2018 follows with EP provider on xarelto  Last Assessment & Plan:  This is stable for her Last Assessment &  Plan:  She has not had any episodes of this stable  . Hyperlipidemia   . Hypertension   . Inappropriate ADH syndrome (HCC)   . Inguinal hernia 05/12/2016  . LBBB (left bundle branch block)   . Left bundle branch block (LBBB) 06/28/2015  . Malaise and fatigue 05/12/2016   Last Assessment & Plan:  Update her TSh for her and her CBC, she is advised to stop smoking   . Migraine    "none since ~ 2012; mild ones then when I did have them because of the beta blockers I was on" (06/09/2017)  . Mild episode of recurrent major depressive disorder (HCC) 09/27/2016  . Mitral regurgitation 06/17/2017   Mild   . Mobitz type 1 second degree AV block   . Myocardial infarction (HCC)    "I've had light ones" (06/09/2017)  . Near syncope   . On amiodarone therapy 04/13/2017  . Osteoarthritis   . Palpitation 01/13/2018  . Paroxysmal atrial fibrillation (HCC) 05/29/2017  . Pneumonia    "couple times" (06/09/2017)  . Preoperative cardiovascular examination 10/01/2018  . SOB (shortness of breath) on exertion   . Statin intolerance 09/22/2019  . Tobacco dependence 09/30/2018  . V-tach [facility]) 06/28/2015    Past Surgical History:  Procedure Laterality Date  . ATRIAL FIBRILLATION ABLATION N/A 05/29/2017   Procedure: Atrial Fibrillation Ablation;  Surgeon: Regan Lemming, MD;  Location: [facility] INVASIVE CV LAB;  Service: Cardiovascular;  Laterality: N/A;  . BREAST SURGERY Left    "took a gland out; milk duct"  . CARDIAC CATHETERIZATION  2008   has had 2 procedures, the last one approx 2008, never had PCI/stent. Dr [name]  . CARPAL TUNNEL RELEASE Left   . DILATION AND CURETTAGE OF UTERUS     "related to heavy bleeding"  . INGUINAL HERNIA REPAIR Left   . KNEE SURGERY Left 10/2018  . LACRIMAL DUCT EXPLORATION Right 04/01/2018   Procedure: LACRIMAL DUCT EXPLORATION AND ETHMOIDECTOMY;  Surgeon: Floydene Flock, MD;  Location: [facility] OR;  Service: Ophthalmology;  Laterality: Right;  . SHOULDER ARTHROSCOPY WITH ROTATOR  CUFF REPAIR Right   . TEAR DUCT PROBING Right 04/01/2018   Procedure: TEAR DUCT PROBING WITH STENT;  Surgeon: Floydene Flock, MD;  Location: [facility] OR;  Service: Ophthalmology;  Laterality: Right;  . TRACHEOSTOMY  2006   "closed on it's own"  . TUBAL LIGATION    . VAGINAL HYSTERECTOMY     "fibroids"    Current Medications: Current Meds  Medication Sig  . acetaminophen (TYLENOL) 500 MG tablet Take 1,000 mg by mouth 2 (two) times daily as needed for mild pain or headache.  . albuterol (PROVENTIL HFA;VENTOLIN HFA) 108 (90 Base) MCG/ACT inhaler Inhale 2 puffs into the lungs every 6 (six) hours as needed for wheezing or shortness of breath.  . budesonide-formoterol (SYMBICORT) 160-4.5 MCG/ACT inhaler Inhale 2 puffs into the lungs in the morning and at bedtime.   . Calcium Carbonate-Vitamin D (CALCIUM 600+D PO) Take 1 tablet by mouth daily.   Marland Kitchen  escitalopram (LEXAPRO) 20 MG tablet TAKE ONE TABLET BY MOUTH ONCE DAILY  . ezetimibe (ZETIA) 10 MG tablet Take 1 tablet (10 mg total) by mouth daily.  . fluticasone (FLONASE) 50 MCG/ACT nasal spray Place 2 sprays into both nostrils daily.  . furosemide (LASIX) 20 MG tablet Take 1 tablet (20 mg total) by mouth 2 (two) times daily. Take only one daily if you weigh 124lbs or less.  Marland Kitchen LORazepam (ATIVAN) 1 MG tablet Take 1 mg by mouth 3 (three) times daily as needed for anxiety.   . nitroGLYCERIN (NITROSTAT) 0.4 MG SL tablet Place 1 tablet (0.4 mg total) under the tongue every 5 (five) minutes as needed for chest pain.  . pantoprazole (PROTONIX) 40 MG tablet Take 40 mg by mouth daily.  . potassium chloride SA (KLOR-CON) 20 MEQ tablet TAKE 1/2 TABLET BY MOUTH TWICE DAILY  . sacubitril-valsartan (ENTRESTO) 97-103 MG Take 1 tablet by mouth 2 (two) times daily.  . traMADol (ULTRAM) 50 MG tablet Take 50-100 mg by mouth every 6 (six) hours as needed.  Carlena Hurl 20 MG TABS tablet TAKE ONE TABLET BY MOUTH ONCE DAILY     Allergies:   Atorvastatin, Cefdinir, and  Levofloxacin   Social History   Socioeconomic History  . Marital status: Married    Spouse name: Not on file  . Number of children: Not on file  . Years of education: Not on file  . Highest education level: Not on file  Occupational History  . Not on file  Tobacco Use  . Smoking status: Current Every Day Smoker    Packs/day: 0.50    Years: 34.00    Pack years: 17.00    Types: Cigarettes  . Smokeless tobacco: Never Used  Vaping Use  . Vaping Use: Former  . Quit date: 03/23/2004  Substance and Sexual Activity  . Alcohol use: No  . Drug use: No  . Sexual activity: Not Currently  Other Topics Concern  . Not on file  Social History Narrative  . Not on file   Social Determinants of Health   Financial Resource Strain: Not on file  Food Insecurity: Not on file  Transportation Needs: Not on file  Physical Activity: Not on file  Stress: Not on file  Social Connections: Not on file     Family History: The patient's family history includes CAD in her brother; Heart disease in her brother. ROS:   Please see the history of present illness.    All other systems reviewed and are negative.  EKGs/Labs/Other Studies Reviewed:    The following studies were reviewed today:  Recent Lipid Panel    Component Value Date/Time   CHOL 170 07/05/2020 1526   TRIG 88 07/05/2020 1526   HDL 65 07/05/2020 1526   CHOLHDL 2.6 07/05/2020 1526   CHOLHDL 3.2 06/11/2017 1439   VLDL 20 06/11/2017 1439   LDLCALC 89 07/05/2020 1526    Physical Exam:    VS:  BP (!) 96/58   Pulse 86   Ht 5' (1.524 m)   Wt 131 lb (59.4 kg)   SpO2 97%   BMI 25.58 kg/m     Wt Readings from Last 3 Encounters:  11/12/20 131 lb (59.4 kg)  05/14/20 134 lb 12.8 oz (61.1 kg)  12/16/19 132 lb (59.9 kg)     GEN:  Well nourished, well developed in no acute distress HEENT: Normal NECK: No JVD; No carotid bruits LYMPHATICS: No lymphadenopathy CARDIAC: RRR, no murmurs, rubs, gallops RESPIRATORY:  Clear to  auscultation without rales, wheezing or rhonchi  ABDOMEN: Soft, non-tender, non-distended MUSCULOSKELETAL:  No edema; No deformity  SKIN: Warm and dry NEUROLOGIC:  Alert and oriented x 3 PSYCHIATRIC:  Normal affect    Signed, [name], MD  11/12/2020 2:00 PM    [facility]

## 2020-11-12 ENCOUNTER — Other Ambulatory Visit: Payer: Self-pay

## 2020-11-12 ENCOUNTER — Ambulatory Visit: Payer: PPO | Admitting: Cardiology

## 2020-11-12 ENCOUNTER — Encounter: Payer: Self-pay | Admitting: Cardiology

## 2020-11-12 VITALS — BP 116/60 | HR 86 | Ht 60.0 in | Wt 131.0 lb

## 2020-11-12 DIAGNOSIS — T466X5A Adverse effect of antihyperlipidemic and antiarteriosclerotic drugs, initial encounter: Secondary | ICD-10-CM

## 2020-11-12 DIAGNOSIS — I480 Paroxysmal atrial fibrillation: Secondary | ICD-10-CM | POA: Diagnosis not present

## 2020-11-12 DIAGNOSIS — I10 Essential (primary) hypertension: Secondary | ICD-10-CM | POA: Diagnosis not present

## 2020-11-12 DIAGNOSIS — J449 Chronic obstructive pulmonary disease, unspecified: Secondary | ICD-10-CM | POA: Diagnosis not present

## 2020-11-12 DIAGNOSIS — I110 Hypertensive heart disease with heart failure: Secondary | ICD-10-CM

## 2020-11-12 DIAGNOSIS — G720 Drug-induced myopathy: Secondary | ICD-10-CM | POA: Diagnosis not present

## 2020-11-12 DIAGNOSIS — I25119 Atherosclerotic heart disease of native coronary artery with unspecified angina pectoris: Secondary | ICD-10-CM | POA: Diagnosis not present

## 2020-11-12 DIAGNOSIS — Z7901 Long term (current) use of anticoagulants: Secondary | ICD-10-CM

## 2020-11-12 DIAGNOSIS — L723 Sebaceous cyst: Secondary | ICD-10-CM | POA: Diagnosis not present

## 2020-11-12 DIAGNOSIS — E782 Mixed hyperlipidemia: Secondary | ICD-10-CM

## 2020-11-12 DIAGNOSIS — F419 Anxiety disorder, unspecified: Secondary | ICD-10-CM | POA: Diagnosis not present

## 2020-11-12 NOTE — Addendum Note (Signed)
Addended by: Delorse Limber I on: 11/12/2020 02:09 PM   Modules accepted: Orders

## 2020-11-12 NOTE — Patient Instructions (Addendum)
Medication Instructions:  Your physician recommends that you continue on your current medications as directed. Please refer to the Current Medication list given to you today.  *If you need a refill on your cardiac medications before your next appointment, please call your pharmacy*   Lab Work: Your physician recommends that you return for lab work in: TODAY CMP, Lipids, ProBNP If you have labs (blood work) drawn today and your tests are completely normal, you will receive your results only by: . MyChart Message (if you have MyChart) OR . A paper copy in the mail If you have any lab test that is abnormal or we need to change your treatment, we will call you to review the results.   Testing/Procedures: None   Follow-Up: At [facility], you and your health needs are our priority.  As part of our continuing mission to provide you with exceptional heart care, we have created designated Provider Care Teams.  These Care Teams include your primary Cardiologist (physician) and Advanced Practice Providers (APPs -  Physician Assistants and Nurse Practitioners) who all work together to provide you with the care you need, when you need it.  We recommend signing up for the patient portal called "MyChart".  Sign up information is provided on this After Visit Summary.  MyChart is used to connect with patients for Virtual Visits (Telemedicine).  Patients are able to view lab/test results, encounter notes, upcoming appointments, etc.  Non-urgent messages can be sent to your provider as well.   To learn more about what you can do with MyChart, go to [email or web address].    Your next appointment:   6 month(s)  The format for your next appointment:   In Person  Provider:   Brian Munley, MD   Other Instructions   

## 2020-11-13 ENCOUNTER — Telehealth: Payer: Self-pay

## 2020-11-13 LAB — COMPREHENSIVE METABOLIC PANEL
ALT: 11 IU/L (ref 0–32)
AST: 17 IU/L (ref 0–40)
Albumin/Globulin Ratio: 1.8 (ref 1.2–2.2)
Albumin: 4.4 g/dL (ref 3.8–4.8)
Alkaline Phosphatase: 66 IU/L (ref 44–121)
BUN/Creatinine Ratio: 17 (ref 12–28)
BUN: 10 mg/dL (ref 8–27)
Bilirubin Total: 0.2 mg/dL (ref 0.0–1.2)
CO2: 21 mmol/L (ref 20–29)
Calcium: 9.2 mg/dL (ref 8.7–10.3)
Chloride: 94 mmol/L — ABNORMAL LOW (ref 96–106)
Creatinine, Ser: 0.6 mg/dL (ref 0.57–1.00)
GFR calc Af Amer: 110 mL/min/{1.73_m2} (ref >59)
GFR calc non Af Amer: 95 mL/min/{1.73_m2} (ref >59)
Globulin, Total: 2.5 g/dL (ref 1.5–4.5)
Glucose: 109 mg/dL — ABNORMAL HIGH (ref 65–99)
Potassium: 3.9 mmol/L (ref 3.5–5.2)
Sodium: 134 mmol/L (ref 134–144)
Total Protein: 6.9 g/dL (ref 6.0–8.5)

## 2020-11-13 LAB — LIPID PANEL
Chol/HDL Ratio: 3 ratio (ref 0.0–4.4)
Cholesterol, Total: 178 mg/dL (ref 100–199)
HDL: 59 mg/dL (ref >39)
LDL Chol Calc (NIH): 91 mg/dL (ref 0–99)
Triglycerides: 162 mg/dL — ABNORMAL HIGH (ref 0–149)
VLDL Cholesterol Cal: 28 mg/dL (ref 5–40)

## 2020-11-13 LAB — PRO B NATRIURETIC PEPTIDE: NT-Pro BNP: 190 pg/mL (ref 0–301)

## 2020-11-13 NOTE — Telephone Encounter (Signed)
-----   Message from Baldo Daub, MD sent at 11/13/2020  7:49 AM EST ----- All are good, no changes in treatment

## 2020-11-13 NOTE — Telephone Encounter (Signed)
Spoke with patient regarding results and recommendation.  Patient verbalizes understanding and is agreeable to plan of care. Advised patient to call back with any issues or concerns.  

## 2020-12-06 ENCOUNTER — Other Ambulatory Visit: Payer: Self-pay | Admitting: Cardiology

## 2020-12-06 DIAGNOSIS — I5043 Acute on chronic combined systolic (congestive) and diastolic (congestive) heart failure: Secondary | ICD-10-CM

## 2021-02-04 ENCOUNTER — Ambulatory Visit: Payer: PPO | Admitting: Cardiology

## 2021-02-27 DIAGNOSIS — I25118 Atherosclerotic heart disease of native coronary artery with other forms of angina pectoris: Secondary | ICD-10-CM | POA: Diagnosis not present

## 2021-02-27 DIAGNOSIS — J441 Chronic obstructive pulmonary disease with (acute) exacerbation: Secondary | ICD-10-CM | POA: Diagnosis not present

## 2021-02-27 DIAGNOSIS — I10 Essential (primary) hypertension: Secondary | ICD-10-CM | POA: Diagnosis not present

## 2021-02-27 DIAGNOSIS — F17200 Nicotine dependence, unspecified, uncomplicated: Secondary | ICD-10-CM | POA: Diagnosis not present

## 2021-03-25 DIAGNOSIS — G720 Drug-induced myopathy: Secondary | ICD-10-CM | POA: Diagnosis not present

## 2021-03-25 DIAGNOSIS — I25118 Atherosclerotic heart disease of native coronary artery with other forms of angina pectoris: Secondary | ICD-10-CM | POA: Diagnosis not present

## 2021-03-25 DIAGNOSIS — I340 Nonrheumatic mitral (valve) insufficiency: Secondary | ICD-10-CM | POA: Diagnosis not present

## 2021-03-25 DIAGNOSIS — I5022 Chronic systolic (congestive) heart failure: Secondary | ICD-10-CM | POA: Diagnosis not present

## 2021-03-25 DIAGNOSIS — J42 Unspecified chronic bronchitis: Secondary | ICD-10-CM | POA: Diagnosis not present

## 2021-03-25 DIAGNOSIS — F17210 Nicotine dependence, cigarettes, uncomplicated: Secondary | ICD-10-CM | POA: Diagnosis not present

## 2021-03-25 DIAGNOSIS — F330 Major depressive disorder, recurrent, mild: Secondary | ICD-10-CM | POA: Diagnosis not present

## 2021-03-25 DIAGNOSIS — R5383 Other fatigue: Secondary | ICD-10-CM | POA: Diagnosis not present

## 2021-03-25 DIAGNOSIS — M159 Polyosteoarthritis, unspecified: Secondary | ICD-10-CM | POA: Diagnosis not present

## 2021-03-25 DIAGNOSIS — E782 Mixed hyperlipidemia: Secondary | ICD-10-CM | POA: Diagnosis not present

## 2021-03-25 DIAGNOSIS — I110 Hypertensive heart disease with heart failure: Secondary | ICD-10-CM | POA: Diagnosis not present

## 2021-03-25 DIAGNOSIS — T466X5A Adverse effect of antihyperlipidemic and antiarteriosclerotic drugs, initial encounter: Secondary | ICD-10-CM | POA: Diagnosis not present

## 2021-03-25 DIAGNOSIS — Z79899 Other long term (current) drug therapy: Secondary | ICD-10-CM | POA: Diagnosis not present

## 2021-03-25 DIAGNOSIS — R5381 Other malaise: Secondary | ICD-10-CM | POA: Diagnosis not present

## 2021-03-25 DIAGNOSIS — E222 Syndrome of inappropriate secretion of antidiuretic hormone: Secondary | ICD-10-CM | POA: Diagnosis not present

## 2021-04-02 DIAGNOSIS — Z1231 Encounter for screening mammogram for malignant neoplasm of breast: Secondary | ICD-10-CM | POA: Diagnosis not present

## 2021-04-10 ENCOUNTER — Telehealth: Payer: Self-pay | Admitting: Cardiology

## 2021-04-10 DIAGNOSIS — I5043 Acute on chronic combined systolic (congestive) and diastolic (congestive) heart failure: Secondary | ICD-10-CM

## 2021-04-10 MED ORDER — FUROSEMIDE 20 MG PO TABS: 20.0000 mg | ORAL_TABLET | Freq: Every day | ORAL | 1 refills | Status: DC

## 2021-04-10 NOTE — Telephone Encounter (Signed)
Patient c/o Palpitations:  High priority if patient c/o lightheadedness, shortness of breath, or chest pain  How long have you had palpitations/irregular HR/ Afib? Are you having the symptoms now? Last week  Are you currently experiencing lightheadedness, SOB or CP? Yes to lightheadedness and sob  Do you have a history of afib (atrial fibrillation) or irregular heart rhythm? yes  Have you checked your BP or HR? (document readings if available): 151/90 hr 76 147/100 hr79 145/80 hr102  Are you experiencing any other symptoms? Patient says she feels like her chest is full. Also have  Migraine

## 2021-04-10 NOTE — Telephone Encounter (Signed)
Spoke to the patient just now and she let me know that she has been lightheaded, dizzy, SOB, and her heart feels very fast. These symptoms are mostly when she is moving around. She states that when her heart feels fast she is short of breath. This has been going on since last week. She states that she feels better today. She states that she tried taking her blood pressure and heart rate but it kept saying error on her machine last week. Today her heart rate was 85 bpm and her BP was 140/82. These were both taken this morning.   She has also been getting some bad migraines. She had two last week.

## 2021-04-11 NOTE — Telephone Encounter (Signed)
Please encouraged the patient to see her PCP to evaluate her for the migraine headache.  In terms of the palpitations I would recommend that if this continues to happen to let us know as she may benefit from wearing a monitor-because of paroxysmal atrial fibrillation.

## 2021-04-11 NOTE — Telephone Encounter (Signed)
Spoke to the patient just now and let her know Dr. Mallory Shirk recommendations. She states that she will call back if she has these symptoms again.    Encouraged patient to call back with any questions or concerns.

## 2021-04-11 NOTE — Telephone Encounter (Signed)
Patient calling back to follow up.

## 2021-04-23 ENCOUNTER — Telehealth: Payer: Self-pay

## 2021-04-23 DIAGNOSIS — K8020 Calculus of gallbladder without cholecystitis without obstruction: Secondary | ICD-10-CM | POA: Diagnosis not present

## 2021-04-23 DIAGNOSIS — Z1211 Encounter for screening for malignant neoplasm of colon: Secondary | ICD-10-CM | POA: Diagnosis not present

## 2021-04-23 DIAGNOSIS — N6012 Diffuse cystic mastopathy of left breast: Secondary | ICD-10-CM | POA: Diagnosis not present

## 2021-04-23 DIAGNOSIS — N6011 Diffuse cystic mastopathy of right breast: Secondary | ICD-10-CM | POA: Diagnosis not present

## 2021-04-23 NOTE — Telephone Encounter (Signed)
   [facility] Pre-operative Risk Assessment    Patient Name: Jennifer Martin  DOB: [birth year]  MRN: [id number]   HEARTCARE STAFF: - Please ensure there is not already an duplicate clearance open for this procedure. - Under Visit Info/Reason for Call, type in Other and utilize the format Clearance MM/DD/YY or Clearance TBD. Do not use dashes or single digits. - If request is for dental extraction, please clarify the # of teeth to be extracted. - If the patient is currently at the dentist's office, call Pre-Op APP to address. If the patient is not currently in the dentist office, please route to the Pre-Op pool  Request for surgical clearance:  What type of surgery is being performed? Colonoscopy  When is this surgery scheduled? 05/23/2021   What type of clearance is required (medical clearance vs. Pharmacy clearance to hold med vs. Both)? Both  Are there any medications that need to be held prior to surgery and how long? Xarelto- Length unspecified   Practice name and name of physician performing surgery? [facility] Dr. Noberto Retort.    What is the office phone number? [phone]   7.   What is the office fax number? [phone]  8.   Anesthesia type (None, local, MAC, general) ? General   Gita Kudo 04/23/2021, 3:58 PM  _________________________________________________________________   (provider comments below)

## 2021-04-24 NOTE — Telephone Encounter (Signed)
Patient with diagnosis of afib on Xarelto for anticoagulation.    Procedure: colonoscopy Date of procedure: 05/23/21  CHA2DS2-VASc Score = 5  This indicates a 7.2% annual risk of stroke. The patient's score is based upon: CHF History: Yes HTN History: Yes Diabetes History: No Stroke History: No Vascular Disease History: Yes Age Score: 1 Gender Score: 1   CrCl 24mL/min Platelet count 316K  Per office protocol, patient can hold Xarelto for 1-2 days prior to procedure.

## 2021-04-25 NOTE — Telephone Encounter (Addendum)
   Patient Name: Jennifer Martin  DOB: 1956/03/11  MRN: [id number]   Primary Cardiologist: [name], MD  Chart reviewed as part of pre-operative protocol coverage. Patient's history is outlined in last OV 10/2020 with Dr. [name]. At that time he recommended to follow up in 6 months. I see she did call with some various symptoms in June with dizziness and palpitations. She has an appt coming up with Dr. [name] on 05/14/21, which is in advance of her colonoscopy procedure date. It seems prudent to await that appt for finalized clearance decisions in case further procedures (I.e. cardioversion/monitor/etc) are needed prior to medical clearance. I will add "preop clearance" to appointment notes so MD is aware. Information about how long to hold anticoagulation can be reviewed at that visit as outlined below.  Per office protocol, the provider should assess clearance at time of office visit and should forward their finalized clearance decision to requesting party below.   I will route this update to requesting provider and I will remove this message from the pre-op box.  Laurann Montana, PA-C 04/25/2021, 8:24 AM

## 2021-05-13 NOTE — Progress Notes (Signed)
Cardiology Office Note:    Date:  05/14/2021   ID:  [name], DOB 1955-10-27, MRN [id number]  PCP:  [name]., MD  Cardiologist:  [name], MD    Referring MD: [name]., MD    ASSESSMENT:    1. Coronary artery disease involving native coronary artery of native heart with angina pectoris (HCC)   2. Hypertensive heart disease with heart failure (HCC)   3. Paroxysmal atrial fibrillation (HCC)   4. Chronic anticoagulation   5. Mixed hyperlipidemia   6. Statin myopathy   7. Chronic obstructive pulmonary disease, unspecified COPD type (HCC)    PLAN:    In order of problems listed above:  Overall is doing well stable CAD having no anginal discomfort continue medical therapy but after discussion we will resume Zetia with a follow-up lipid in 6 to 8 weeks and if not at target either bempedoic acid or PCSK9 inhibitor. Recheck blood pressure before she leaves the office I think much of this is related to anxiety and situation continue her diuretic along with guideline directed therapy with Entresto. Stable maintaining sinus rhythm continue anticoagulant she can withdraw from 3 days before colonoscopy Resume nonstatin therapies adding Stable COPD continue bronchodilator   Next appointment: 6 weeks   Medication Adjustments/Labs and Tests Ordered: Current medicines are reviewed at length with the patient today.  Concerns regarding medicines are outlined above.  Orders Placed This Encounter  Procedures   Lipid panel   EKG 12-Lead    Meds ordered this encounter  Medications   ezetimibe (ZETIA) 10 MG tablet    Sig: Take 0.5 tablets (5 mg total) by mouth daily.    Dispense:  45 tablet    Refill:  3     Chief Complaint  Patient presents with   Follow-up   Coronary Artery Disease   Congestive Heart Failure   Atrial Fibrillation   Hyperlipidemia     History of Present Illness:    Jennifer Martin is a 65 y.o. female with a hx of complex heart disease  including CAD heart failure with normalization of her ejection fraction on guideline directed therapy hypertensive heart disease with heart failure paroxysmal atrial fibrillation flutter status post EP ablation in 2018 hyperlipidemia COPD and left bundle branch block last seen 11/12/2020.  Compliance with diet, lifestyle and medications: Yes   Most recent labs 03/25/2021: Cholesterol 202 LDL cholesterol 124 triglycerides 106 HDL 68 non-HDL cholesterol elevated 134 CMP normal except for mildly diminished sodium 134 potassium 4.1 normal liver function test and GFR greater than 90 cc/min  She is under intense personal stress at home with her husband's lung cancer and her father-in-law at age 28 and moved into the home supervising him. She has had some palpitation not severe or sustained he has had no chest pain edema or syncope. She stopped taking Zetia we discussed a PCSK9 inhibitor and she is agreeing to resume and recheck his lipids in 6 to 8 weeks.   She inquires about colonoscopy and anticoagulant I told her she would need to be off for 3 days before. She is not having angina shortness of breath or syncope Past Medical History:  Diagnosis Date   Acute pulmonary edema (HCC)    Anxiety    Aortic regurgitation 06/17/2017   Mild to moderate    Atherosclerosis of coronary artery of native heart with angina pectoris [facility])    Atrial flutter (HCC)    CAD (coronary artery disease)  CHF (congestive heart failure) (HCC)    Chronic anticoagulation 04/13/2017   Chronic combined systolic and diastolic heart failure (HCC) 04/13/2017   varying EF in the past - 45-50% in 09/2016, 37% by echo 08/2016,  61% by nuc 01/2017 then 30-35% this admission (40% by review from Dr. Mayford Knife)   Chronic obstructive pulmonary disease (HCC) 10/10/2016   COPD (chronic obstructive pulmonary disease) (HCC)    Coronary artery disease involving native coronary artery of native heart with angina pectoris (HCC) 03/25/2017    Overview:  CTO of LCF Lexiscan MPS 07/28/13 with no ischemia, EF 50%   Demand ischemia (HCC)    Depression    Difficult intubation    " small airway and need a little tube "   Diverticulosis    GERD (gastroesophageal reflux disease)    Gout 05/12/2016   Last Assessment & Plan:  She needs her uric acid level checked    High risk medication use 05/12/2016   History of atrial fibrillation 06/28/2015   Ablation 2018 follows with EP provider on xarelto  Last Assessment & Plan:  This is stable for her Last Assessment & Plan:  She has not had any episodes of this stable   Hyperlipidemia    Hypertension    Inappropriate ADH syndrome (HCC)    Inguinal hernia 05/12/2016   LBBB (left bundle branch block)    Left bundle branch block (LBBB) 06/28/2015   Malaise and fatigue 05/12/2016   Last Assessment & Plan:  Update her TSh for her and her CBC, she is advised to stop smoking    Migraine    "none since ~ 2012; mild ones then when I did have them because of the beta blockers I was on" (06/09/2017)   Mild episode of recurrent major depressive disorder (HCC) 09/27/2016   Mitral regurgitation 06/17/2017   Mild    Mobitz type 1 second degree AV block    Myocardial infarction (HCC)    "I've had light ones" (06/09/2017)   Near syncope    On amiodarone therapy 04/13/2017   Osteoarthritis    Palpitation 01/13/2018   Paroxysmal atrial fibrillation (HCC) 05/29/2017   Pneumonia    "couple times" (06/09/2017)   Preoperative cardiovascular examination 10/01/2018   SOB (shortness of breath) on exertion    Statin intolerance 09/22/2019   Tobacco dependence 09/30/2018   V-tach (HCC) 06/28/2015    Past Surgical History:  Procedure Laterality Date   ATRIAL FIBRILLATION ABLATION N/A 05/29/2017   Procedure: Atrial Fibrillation Ablation;  Surgeon: Regan Lemming, MD;  Location: [facility] INVASIVE CV LAB;  Service: Cardiovascular;  Laterality: N/A;   BREAST SURGERY Left    "took a gland out; milk duct"   CARDIAC  CATHETERIZATION  2008   has had 2 procedures, the last one approx 2008, never had PCI/stent. Dr [name]   CARPAL TUNNEL RELEASE Left    DILATION AND CURETTAGE OF UTERUS     "related to heavy bleeding"   INGUINAL HERNIA REPAIR Left    KNEE SURGERY Left 10/2018   LACRIMAL DUCT EXPLORATION Right 04/01/2018   Procedure: LACRIMAL DUCT EXPLORATION AND ETHMOIDECTOMY;  Surgeon: Floydene Flock, MD;  Location: [facility] OR;  Service: Ophthalmology;  Laterality: Right;   SHOULDER ARTHROSCOPY WITH ROTATOR CUFF REPAIR Right    TEAR DUCT PROBING Right 04/01/2018   Procedure: TEAR DUCT PROBING WITH STENT;  Surgeon: Floydene Flock, MD;  Location: [facility] OR;  Service: Ophthalmology;  Laterality: Right;   TRACHEOSTOMY  2006   "closed on  it's own"   TUBAL LIGATION     VAGINAL HYSTERECTOMY     "fibroids"    Current Medications: Current Meds  Medication Sig   acetaminophen (TYLENOL) 500 MG tablet Take 1,000 mg by mouth 2 (two) times daily as needed for mild pain or headache.   albuterol (PROVENTIL HFA;VENTOLIN HFA) 108 (90 Base) MCG/ACT inhaler Inhale 2 puffs into the lungs every 6 (six) hours as needed for wheezing or shortness of breath.   budesonide-formoterol (SYMBICORT) 160-4.5 MCG/ACT inhaler Inhale 2 puffs into the lungs in the morning and at bedtime.    Calcium Carbonate-Vitamin D (CALCIUM 600+D PO) Take 1 tablet by mouth daily.    escitalopram (LEXAPRO) 20 MG tablet TAKE ONE TABLET BY MOUTH ONCE DAILY   ezetimibe (ZETIA) 10 MG tablet Take 0.5 tablets (5 mg total) by mouth daily.   fluticasone (FLONASE) 50 MCG/ACT nasal spray Place 2 sprays into both nostrils daily.   furosemide (LASIX) 20 MG tablet Take 1 tablet (20 mg total) by mouth daily. Take only one daily if you weigh 124lbs or less.   LORazepam (ATIVAN) 1 MG tablet Take 1 mg by mouth 3 (three) times daily as needed for anxiety.    nitroGLYCERIN (NITROSTAT) 0.4 MG SL tablet Place 1 tablet (0.4 mg total) under the tongue every 5 (five) minutes as  needed for chest pain.   pantoprazole (PROTONIX) 40 MG tablet Take 40 mg by mouth daily.   potassium chloride SA (KLOR-CON) 20 MEQ tablet TAKE 1/2 TABLET BY MOUTH TWICE DAILY   sacubitril-valsartan (ENTRESTO) 97-103 MG Take 1 tablet by mouth 2 (two) times daily.   traMADol (ULTRAM) 50 MG tablet Take 50-100 mg by mouth every 6 (six) hours as needed.   XARELTO 20 MG TABS tablet TAKE ONE TABLET BY MOUTH ONCE DAILY     Allergies:   Atorvastatin, Cefdinir, and Levofloxacin   Social History   Socioeconomic History   Marital status: Married    Spouse name: Not on file   Number of children: Not on file   Years of education: Not on file   Highest education level: Not on file  Occupational History   Not on file  Tobacco Use   Smoking status: Every Day    Packs/day: 0.50    Years: 34.00    Pack years: 17.00    Types: Cigarettes   Smokeless tobacco: Never  Vaping Use   Vaping Use: Former   Quit date: 03/23/2004  Substance and Sexual Activity   Alcohol use: No   Drug use: No   Sexual activity: Not Currently  Other Topics Concern   Not on file  Social History Narrative   Not on file   Social Determinants of Health   Financial Resource Strain: Not on file  Food Insecurity: Not on file  Transportation Needs: Not on file  Physical Activity: Not on file  Stress: Not on file  Social Connections: Not on file     Family History: The patient's family history includes CAD in her brother; Heart disease in her brother. ROS:   Please see the history of present illness.    All other systems reviewed and are negative.  EKGs/Labs/Other Studies Reviewed:    The following studies were reviewed today:  EKG:  EKG ordered today and personally reviewed.  The ekg ordered today demonstrates sinus rhythm stable pattern left bundle branch block  Recent Labs: 11/12/2020: ALT 11; BUN 10; Creatinine, Ser 0.60; NT-Pro BNP 190; Potassium 3.9; Sodium 134  Recent Lipid  Panel    Component Value  Date/Time   CHOL 178 11/12/2020 1412   TRIG 162 (H) 11/12/2020 1412   HDL 59 11/12/2020 1412   CHOLHDL 3.0 11/12/2020 1412   CHOLHDL 3.2 06/11/2017 1439   VLDL 20 06/11/2017 1439   LDLCALC 91 11/12/2020 1412    Physical Exam:    VS:  BP 106/60 (BP Location: Right Arm, Patient Position: Sitting, Cuff Size: Normal)   Pulse 86   Ht 5' (1.524 m)   Wt 129 lb 9.6 oz (58.8 kg)   SpO2 98%   BMI 25.31 kg/m     Wt Readings from Last 3 Encounters:  05/14/21 129 lb 9.6 oz (58.8 kg)  11/12/20 131 lb (59.4 kg)  05/14/20 134 lb 12.8 oz (61.1 kg)     GEN: Anxious well nourished, well developed in no acute distress HEENT: Normal NECK: No JVD; No carotid bruits LYMPHATICS: No lymphadenopathy CARDIAC: Second heart sound.  Occipital RRR, no murmurs, rubs, gallops RESPIRATORY:  Clear to auscultation without rales, wheezing or rhonchi  ABDOMEN: Soft, non-tender, non-distended MUSCULOSKELETAL:  No edema; No deformity  SKIN: Warm and dry NEUROLOGIC:  Alert and oriented x 3 PSYCHIATRIC:  Normal affect    Signed, [name], MD  05/14/2021 1:14 PM    [facility]

## 2021-05-14 ENCOUNTER — Other Ambulatory Visit: Payer: Self-pay

## 2021-05-14 ENCOUNTER — Ambulatory Visit: Payer: PPO | Admitting: Cardiology

## 2021-05-14 ENCOUNTER — Encounter: Payer: Self-pay | Admitting: Cardiology

## 2021-05-14 VITALS — BP 106/60 | HR 86 | Ht 60.0 in | Wt 129.6 lb

## 2021-05-14 DIAGNOSIS — G720 Drug-induced myopathy: Secondary | ICD-10-CM | POA: Diagnosis not present

## 2021-05-14 DIAGNOSIS — I110 Hypertensive heart disease with heart failure: Secondary | ICD-10-CM | POA: Diagnosis not present

## 2021-05-14 DIAGNOSIS — I480 Paroxysmal atrial fibrillation: Secondary | ICD-10-CM | POA: Diagnosis not present

## 2021-05-14 DIAGNOSIS — Z7901 Long term (current) use of anticoagulants: Secondary | ICD-10-CM

## 2021-05-14 DIAGNOSIS — E782 Mixed hyperlipidemia: Secondary | ICD-10-CM | POA: Diagnosis not present

## 2021-05-14 DIAGNOSIS — J449 Chronic obstructive pulmonary disease, unspecified: Secondary | ICD-10-CM | POA: Diagnosis not present

## 2021-05-14 DIAGNOSIS — T466X5D Adverse effect of antihyperlipidemic and antiarteriosclerotic drugs, subsequent encounter: Secondary | ICD-10-CM

## 2021-05-14 DIAGNOSIS — I25119 Atherosclerotic heart disease of native coronary artery with unspecified angina pectoris: Secondary | ICD-10-CM | POA: Diagnosis not present

## 2021-05-14 MED ORDER — EZETIMIBE 10 MG PO TABS: 5.0000 mg | ORAL_TABLET | Freq: Every day | ORAL | 3 refills | Status: DC

## 2021-05-14 NOTE — Patient Instructions (Signed)
Medication Instructions:  Your physician has recommended you make the following change in your medication:  START: Zetia 0.5 tablet 5 mg by mouth daily.  *If you need a refill on your cardiac medications before your next appointment, please call your pharmacy*   Lab Work: Your physician recommends that you return for lab work in: 6 weeks Lipids If you have labs (blood work) drawn today and your tests are completely normal, you will receive your results only by: MyChart Message (if you have MyChart) OR A paper copy in the mail If you have any lab test that is abnormal or we need to change your treatment, we will call you to review the results.   Testing/Procedures: None   Follow-Up: At [facility], you and your health needs are our priority.  As part of our continuing mission to provide you with exceptional heart care, we have created designated Provider Care Teams.  These Care Teams include your primary Cardiologist (physician) and Advanced Practice Providers (APPs -  Physician Assistants and Nurse Practitioners) who all work together to provide you with the care you need, when you need it.  We recommend signing up for the patient portal called "MyChart".  Sign up information is provided on this After Visit Summary.  MyChart is used to connect with patients for Virtual Visits (Telemedicine).  Patients are able to view lab/test results, encounter notes, upcoming appointments, etc.  Non-urgent messages can be sent to your provider as well.   To learn more about what you can do with MyChart, go to [email or web address].    Your next appointment:   6 month(s)  The format for your next appointment:   In Person  Provider:   [name], MD   Other Instructions

## 2021-05-23 DIAGNOSIS — Z1211 Encounter for screening for malignant neoplasm of colon: Secondary | ICD-10-CM | POA: Diagnosis not present

## 2021-05-23 DIAGNOSIS — F17210 Nicotine dependence, cigarettes, uncomplicated: Secondary | ICD-10-CM | POA: Diagnosis not present

## 2021-05-23 DIAGNOSIS — K5730 Diverticulosis of large intestine without perforation or abscess without bleeding: Secondary | ICD-10-CM | POA: Diagnosis not present

## 2021-06-03 ENCOUNTER — Ambulatory Visit: Payer: PPO | Admitting: Cardiology

## 2021-06-03 ENCOUNTER — Encounter: Payer: Self-pay | Admitting: Cardiology

## 2021-06-03 ENCOUNTER — Other Ambulatory Visit: Payer: Self-pay

## 2021-06-03 VITALS — BP 140/72 | HR 77 | Ht 60.0 in | Wt 130.0 lb

## 2021-06-03 DIAGNOSIS — I480 Paroxysmal atrial fibrillation: Secondary | ICD-10-CM | POA: Diagnosis not present

## 2021-06-03 NOTE — Patient Instructions (Signed)
Medication Instructions:  Your physician recommends that you continue on your current medications as directed. Please refer to the Current Medication list given to you today.  *If you need a refill on your cardiac medications before your next appointment, please call your pharmacy*   Lab Work: None ordered   If you have labs (blood work) drawn today and your tests are completely normal, you will receive your results only by: MyChart Message (if you have MyChart) OR A paper copy in the mail If you have any lab test that is abnormal or we need to change your treatment, we will call you to review the results.   Testing/Procedures: None ordered   Follow-Up: At [facility], you and your health needs are our priority.  As part of our continuing mission to provide you with exceptional heart care, we have created designated Provider Care Teams.  These Care Teams include your primary Cardiologist (physician) and Advanced Practice Providers (APPs -  Physician Assistants and Nurse Practitioners) who all work together to provide you with the care you need, when you need it.  We recommend signing up for the patient portal called "MyChart".  Sign up information is provided on this After Visit Summary.  MyChart is used to connect with patients for Virtual Visits (Telemedicine).  Patients are able to view lab/test results, encounter notes, upcoming appointments, etc.  Non-urgent messages can be sent to your provider as well.   To learn more about what you can do with MyChart, go to [email or web address].    Your next appointment:   6 month(s)  The format for your next appointment:   In Person  Provider:   You may see Will [name], MD or one of the following Advanced Practice Providers on your designated Care Team:      Other Instructions None

## 2021-06-03 NOTE — Progress Notes (Signed)
Electrophysiology Office Note   Date:  06/03/2021   ID:  Jennifer Martin, DOB [birth year], MRN [id number]  PCP:  [name]., MD  Cardiologist:  [name] Primary Electrophysiologist:  Tanise Russman [name], MD    No chief complaint on file.    History of Present Illness: Jennifer Martin is a 65 y.o. female who is being seen today for the evaluation of atrial flutter at the request of [name]. Presenting today for electrophysiology evaluation.   She has a history significant for coronary artery disease, hyperlipidemia, hypertension, atrial fibrillation and atrial flutter.  She also has COPD, demand ischemia, and heart failure.  She was admitted to [facility] with atrial fibrillation.  She is now status post A. fib ablation 05/29/2017.  Today, denies symptoms of palpitations, chest pain, shortness of breath, orthopnea, PND, lower extremity edema, claudication, dizziness, presyncope, syncope, bleeding, or neurologic sequela. The patient is tolerating medications without difficulties.  Since being seen she has done well.  She has had a few episodes of atrial fibrillation, though she feels that they are stress related.  Her husband has lung cancer and is currently in remission.  Her 58 year old father-in-law has also moved into their house.  Past Medical History:  Diagnosis Date   Acute pulmonary edema (HCC)    Anxiety    Aortic regurgitation 06/17/2017   Mild to moderate    Atherosclerosis of coronary artery of native heart with angina pectoris [facility])    Atrial flutter (HCC)    CAD (coronary artery disease)    CHF (congestive heart failure) (HCC)    Chronic anticoagulation 04/13/2017   Chronic combined systolic and diastolic heart failure (HCC) 04/13/2017   varying EF in the past - 45-50% in 09/2016, 37% by echo 08/2016,  61% by nuc 01/2017 then 30-35% this admission (40% by review from Dr. Mayford Knife)   Chronic obstructive pulmonary disease (HCC) 10/10/2016   COPD (chronic obstructive  pulmonary disease) (HCC)    Coronary artery disease involving native coronary artery of native heart with angina pectoris (HCC) 03/25/2017   Overview:  CTO of LCF Lexiscan MPS 07/28/13 with no ischemia, EF 50%   Demand ischemia (HCC)    Depression    Difficult intubation    " small airway and need a little tube "   Diverticulosis    GERD (gastroesophageal reflux disease)    Gout 05/12/2016   Last Assessment & Plan:  She needs her uric acid level checked    High risk medication use 05/12/2016   History of atrial fibrillation 06/28/2015   Ablation 2018 follows with EP provider on xarelto  Last Assessment & Plan:  This is stable for her Last Assessment & Plan:  She has not had any episodes of this stable   Hyperlipidemia    Hypertension    Inappropriate ADH syndrome (HCC)    Inguinal hernia 05/12/2016   LBBB (left bundle branch block)    Left bundle branch block (LBBB) 06/28/2015   Malaise and fatigue 05/12/2016   Last Assessment & Plan:  Update her TSh for her and her CBC, she is advised to stop smoking    Migraine    "none since ~ 2012; mild ones then when I did have them because of the beta blockers I was on" (06/09/2017)   Mild episode of recurrent major depressive disorder (HCC) 09/27/2016   Mitral regurgitation 06/17/2017   Mild    Mobitz type 1 second degree AV block    Myocardial infarction (HCC)    "  I've had light ones" (06/09/2017)   Near syncope    On amiodarone therapy 04/13/2017   Osteoarthritis    Palpitation 01/13/2018   Paroxysmal atrial fibrillation (HCC) 05/29/2017   Pneumonia    "couple times" (06/09/2017)   Preoperative cardiovascular examination 10/01/2018   SOB (shortness of breath) on exertion    Statin intolerance 09/22/2019   Tobacco dependence 09/30/2018   V-tach (HCC) 06/28/2015   Past Surgical History:  Procedure Laterality Date   ATRIAL FIBRILLATION ABLATION N/A 05/29/2017   Procedure: Atrial Fibrillation Ablation;  Surgeon: Regan Lemming, MD;  Location: [facility]  INVASIVE CV LAB;  Service: Cardiovascular;  Laterality: N/A;   BREAST SURGERY Left    "took a gland out; milk duct"   CARDIAC CATHETERIZATION  2008   has had 2 procedures, the last one approx 2008, never had PCI/stent. Dr [name]   CARPAL TUNNEL RELEASE Left    DILATION AND CURETTAGE OF UTERUS     "related to heavy bleeding"   INGUINAL HERNIA REPAIR Left    KNEE SURGERY Left 10/2018   LACRIMAL DUCT EXPLORATION Right 04/01/2018   Procedure: LACRIMAL DUCT EXPLORATION AND ETHMOIDECTOMY;  Surgeon: Floydene Flock, MD;  Location: [facility] OR;  Service: Ophthalmology;  Laterality: Right;   SHOULDER ARTHROSCOPY WITH ROTATOR CUFF REPAIR Right    TEAR DUCT PROBING Right 04/01/2018   Procedure: TEAR DUCT PROBING WITH STENT;  Surgeon: Floydene Flock, MD;  Location: [facility] OR;  Service: Ophthalmology;  Laterality: Right;   TRACHEOSTOMY  2006   "closed on it's own"   TUBAL LIGATION     VAGINAL HYSTERECTOMY     "fibroids"     Current Outpatient Medications  Medication Sig Dispense Refill   acetaminophen (TYLENOL) 500 MG tablet Take 1,000 mg by mouth 2 (two) times daily as needed for mild pain or headache.     albuterol (PROVENTIL HFA;VENTOLIN HFA) 108 (90 Base) MCG/ACT inhaler Inhale 2 puffs into the lungs every 6 (six) hours as needed for wheezing or shortness of breath.     budesonide-formoterol (SYMBICORT) 160-4.5 MCG/ACT inhaler Inhale 2 puffs into the lungs in the morning and at bedtime.      Calcium Carbonate-Vitamin D (CALCIUM 600+D PO) Take 1 tablet by mouth daily.      escitalopram (LEXAPRO) 20 MG tablet TAKE ONE TABLET BY MOUTH ONCE DAILY     ezetimibe (ZETIA) 10 MG tablet Take 0.5 tablets (5 mg total) by mouth daily. 45 tablet 3   fluticasone (FLONASE) 50 MCG/ACT nasal spray Place 2 sprays into both nostrils daily.     furosemide (LASIX) 20 MG tablet Take 1 tablet (20 mg total) by mouth daily. Take only one daily if you weigh 124lbs or less. 180 tablet 1   LORazepam (ATIVAN) 1 MG tablet Take  1 mg by mouth 3 (three) times daily as needed for anxiety.      nitroGLYCERIN (NITROSTAT) 0.4 MG SL tablet Place 1 tablet (0.4 mg total) under the tongue every 5 (five) minutes as needed for chest pain. 25 tablet 11   pantoprazole (PROTONIX) 40 MG tablet Take 40 mg by mouth daily.     potassium chloride SA (KLOR-CON) 20 MEQ tablet TAKE 1/2 TABLET BY MOUTH TWICE DAILY 90 tablet 1   sacubitril-valsartan (ENTRESTO) 97-103 MG Take 1 tablet by mouth 2 (two) times daily. 180 tablet 1   traMADol (ULTRAM) 50 MG tablet Take 50-100 mg by mouth every 6 (six) hours as needed.     XARELTO 20 MG TABS tablet  TAKE ONE TABLET BY MOUTH ONCE DAILY 90 tablet 1   No current facility-administered medications for this visit.    Allergies:   Atorvastatin, Cefdinir, and Levofloxacin   Social History:  The patient  reports that she has been smoking cigarettes. She has a 17.00 pack-year smoking history. She has never used smokeless tobacco. She reports that she does not drink alcohol and does not use drugs.   Family History:  The patient's family history includes CAD in her brother; Heart disease in her brother.   ROS:  Please see the history of present illness.   Otherwise, review of systems is positive for none.   All other systems are reviewed and negative.   PHYSICAL EXAM: VS:  BP 140/72   Pulse 77   Ht 5' (1.524 m)   Wt 130 lb (59 kg)   SpO2 96%   BMI 25.39 kg/m  , BMI Body mass index is 25.39 kg/m. GEN: Well nourished, well developed, in no acute distress  HEENT: normal  Neck: no JVD, carotid bruits, or masses Cardiac: RRR; no murmurs, rubs, or gallops,no edema  Respiratory:  clear to auscultation bilaterally, normal work of breathing GI: soft, nontender, nondistended, + BS MS: no deformity or atrophy  Skin: warm and dry Neuro:  Strength and sensation are intact Psych: euthymic mood, full affect  EKG:  EKG is not ordered today. Personal review of the ekg ordered 05/14/21 shows sinus rhythm, left  bundle branch block  Recent Labs: 11/12/2020: ALT 11; BUN 10; Creatinine, Ser 0.60; NT-Pro BNP 190; Potassium 3.9; Sodium 134    Lipid Panel     Component Value Date/Time   CHOL 178 11/12/2020 1412   TRIG 162 (H) 11/12/2020 1412   HDL 59 11/12/2020 1412   CHOLHDL 3.0 11/12/2020 1412   CHOLHDL 3.2 06/11/2017 1439   VLDL 20 06/11/2017 1439   LDLCALC 91 11/12/2020 1412     Wt Readings from Last 3 Encounters:  06/03/21 130 lb (59 kg)  05/14/21 129 lb 9.6 oz (58.8 kg)  11/12/20 131 lb (59.4 kg)      Other studies Reviewed: Additional studies/ records that were reviewed today include: TTE 12/15/17 - Left ventricle: The cavity size was normal. Wall thickness was   increased in a pattern of mild LVH. Systolic function was normal.   Wall motion was normal; there were no regional wall motion   abnormalities. Doppler parameters are consistent with abnormal   left ventricular relaxation (grade 1 diastolic dysfunction). - Aortic valve: There was mild regurgitation. Valve area (VTI):   3.03 cm^2. Valve area (Vmax): 2.51 cm^2. Valve area (Vmean): 2.51   cm^2. - Mitral valve: There was mild regurgitation. - Left atrium: The atrium was moderately dilated.  ASSESSMENT AND PLAN:  1.  Atrial fibrillation/flutter: Currently on Xarelto.  Status post ablation 05/29/2017.  CHA2DS2-VASc of 3.  She has had short episodes which have been relieved with Ativan.  She feels it is due to stress.  Otherwise feeling well.  No changes.  2.  Hypertension: Mildly elevated today.  Normally well controlled at home.  No changes.  3.  Hyperlipidemia: Continue statin per primary cardiology  4.  Coronary artery disease: No current chest pain.  Continue Plavix.  Current medicines are reviewed at length with the patient today.   The patient does not have concerns regarding her medicines.  The following changes were made today: None  Labs/ tests ordered today include:  No orders of the defined types were placed  in this encounter.    Disposition:   FU with Emeril Stille 6 months  Signed, Iridian Reader [name], MD  06/03/2021 11:15 AM     [facility] [address] [place] [address] [phone] (office) [phone] (fax)

## 2021-06-20 ENCOUNTER — Other Ambulatory Visit: Payer: Self-pay | Admitting: Cardiology

## 2021-06-20 DIAGNOSIS — I25118 Atherosclerotic heart disease of native coronary artery with other forms of angina pectoris: Secondary | ICD-10-CM

## 2021-06-20 DIAGNOSIS — I4892 Unspecified atrial flutter: Secondary | ICD-10-CM

## 2021-06-20 DIAGNOSIS — I5043 Acute on chronic combined systolic (congestive) and diastolic (congestive) heart failure: Secondary | ICD-10-CM

## 2021-06-20 DIAGNOSIS — I480 Paroxysmal atrial fibrillation: Secondary | ICD-10-CM

## 2021-06-26 ENCOUNTER — Other Ambulatory Visit: Payer: Self-pay

## 2021-06-26 DIAGNOSIS — E782 Mixed hyperlipidemia: Secondary | ICD-10-CM | POA: Diagnosis not present

## 2021-06-27 ENCOUNTER — Telehealth: Payer: Self-pay

## 2021-06-27 LAB — LIPID PANEL
Chol/HDL Ratio: 3.1 ratio (ref 0.0–4.4)
Cholesterol, Total: 211 mg/dL — ABNORMAL HIGH (ref 100–199)
HDL: 68 mg/dL (ref >39)
LDL Chol Calc (NIH): 122 mg/dL — ABNORMAL HIGH (ref 0–99)
Triglycerides: 120 mg/dL (ref 0–149)
VLDL Cholesterol Cal: 21 mg/dL (ref 5–40)

## 2021-06-27 NOTE — Telephone Encounter (Signed)
Spoke with patient regarding results and recommendation.  Patient verbalizes understanding and is agreeable to plan of care. Advised patient to call back with any issues or concerns.  

## 2021-06-27 NOTE — Telephone Encounter (Signed)
-----   Message from Baldo Daub, MD sent at 06/27/2021 10:11 AM EDT ----- Regarding: FW: Stable stay on zetia ----- Message ----- From: Interface, [facility] Lab Results In Sent: 06/27/2021   5:38 AM EDT To: Baldo Daub, MD

## 2021-08-29 DIAGNOSIS — Z23 Encounter for immunization: Secondary | ICD-10-CM | POA: Diagnosis not present

## 2021-10-07 ENCOUNTER — Other Ambulatory Visit: Payer: Self-pay | Admitting: Cardiology

## 2021-11-10 NOTE — Progress Notes (Signed)
Cardiology Office Note:    Date:  11/11/2021   ID:  Jennifer Martin, DOB 08-Jan-1956, MRN [id number]  PCP:  Raina Mina., MD  Cardiologist:  Shirlee More, MD    Referring MD: Raina Mina., MD    ASSESSMENT:    1. Paroxysmal atrial fibrillation (HCC)   2. Chronic anticoagulation   3. Hypertensive heart disease with heart failure ([place])   4. Mixed hyperlipidemia   5. Statin myopathy   6. Coronary artery disease involving native coronary artery of native heart with angina pectoris ([place])   7. Chronic obstructive pulmonary disease, unspecified COPD type ([place])    PLAN:    In order of problems listed above:  Stable continues to maintain sinus rhythm continue her current anticoagulant not on a beta-blocker because of her underlying lung disease and bronchospasm BP at target continue guideline directed therapy with heart failure including Entresto and loop diuretic BP at target Continue Zetia good response statin intolerant with myopathy Stable 1 episode of angina relieved with nitroglycerin would not repeat coronary angiography at this time Stable COPD managed by her PCP with bronchodilator   Next appointment: 6 months   Medication Adjustments/Labs and Tests Ordered: Current medicines are reviewed at length with the patient today.  Concerns regarding medicines are outlined above.  Orders Placed This Encounter  Procedures   EKG 12-Lead   Meds ordered this encounter  Medications   nitroGLYCERIN (NITROSTAT) 0.4 MG SL tablet    Sig: DISSOLVE 1 TABLET UNDER THE TONGUE EVERY 5 MINUTES AS NEEDED FOR CHEST PAIN. DO NOT EXCEED A TOTAL OF 3 DOSES IN 15 MINUTES.    Dispense:  25 tablet    Refill:  3    Chief Complaint  Patient presents with   Follow-up   Congestive Heart Failure   Atrial Fibrillation   Coronary Artery Disease   Hyperlipidemia    History of Present Illness:    Jennifer Martin is a 66 y.o. female with a hx of complex heart disease including CAD heart failure  with normalization of ejection fraction on guideline directed therapy hypertensive heart disease with heart failure paroxysmal atrial fibrillation a flutter status post EP ablation 2018 hyperlipidemia COPD and left bundle branch block last seen 05/14/2021.  She has had a statin myopathy and is not on Zetia.  Compliance with diet, lifestyle and medications: Yes  She is grieving at the recent death of her father-in-law the care for him at their home. She had an episode of angina shopping at [employer] relieved with 2 nitroglycerin No edema shortness of breath palpitation or syncope She tolerates her anticoagulant without bleeding and setting without muscle pain or weakness  Recent labs [facility] PCP 10/29/2021 cholesterol 196 LDL 95 and triglycerides 114 HDL 80 from creatinine 0.51 sodium 137 potassium 4.7 normal liver function test hemoglobin 14.4 Past Medical History:  Diagnosis Date   Acute pulmonary edema (HCC)    Anxiety    Aortic regurgitation 06/17/2017   Mild to moderate    Atherosclerosis of coronary artery of native heart with angina pectoris [facility])    Atrial flutter (HCC)    CAD (coronary artery disease)    CHF (congestive heart failure) ([place])    Chronic anticoagulation 04/13/2017   Chronic combined systolic and diastolic heart failure ([place]) 04/13/2017   varying EF in the past - 45-50% in 09/2016, 37% by echo 08/2016,  61% by nuc 01/2017 then 30-35% this admission (40% by review from Dr. Radford Pax)   Chronic  obstructive pulmonary disease ([place]) 10/10/2016   COPD (chronic obstructive pulmonary disease) (HCC)    Coronary artery disease involving native coronary artery of native heart with angina pectoris ([place]) 03/25/2017   Overview:  CTO of LCF Lexiscan MPS 07/28/13 with no ischemia, EF 50%   Demand ischemia ([place])    Depression    Difficult intubation    " small airway and need a little tube "   Diverticulosis    GERD (gastroesophageal reflux disease)    Gout 05/12/2016   Last  Assessment & Plan:  She needs her uric acid level checked    High risk medication use 05/12/2016   History of atrial fibrillation 06/28/2015   Ablation 2018 follows with EP provider on xarelto  Last Assessment & Plan:  This is stable for her Last Assessment & Plan:  She has not had any episodes of this stable   Hyperlipidemia    Hypertension    Inappropriate ADH syndrome ([place])    Inguinal hernia 05/12/2016   LBBB (left bundle branch block)    Left bundle branch block (LBBB) 06/28/2015   Malaise and fatigue 05/12/2016   Last Assessment & Plan:  Update her TSh for her and her CBC, she is advised to stop smoking    Migraine    "none since ~ 2012; mild ones then when I did have them because of the beta blockers I was on" (06/09/2017)   Mild episode of recurrent major depressive disorder ([place]) 09/27/2016   Mitral regurgitation 06/17/2017   Mild    Mobitz type 1 second degree AV block    Myocardial infarction ([place])    "I've had light ones" (06/09/2017)   Near syncope    On amiodarone therapy 04/13/2017   Osteoarthritis    Palpitation 01/13/2018   Paroxysmal atrial fibrillation ([place]) 05/29/2017   Pneumonia    "couple times" (06/09/2017)   Preoperative cardiovascular examination 10/01/2018   SOB (shortness of breath) on exertion    Statin intolerance 09/22/2019   Tobacco dependence 09/30/2018   V-tach 06/28/2015    Past Surgical History:  Procedure Laterality Date   ATRIAL FIBRILLATION ABLATION N/A 05/29/2017   Procedure: Atrial Fibrillation Ablation;  Surgeon: Constance Haw, MD;  Location: [place] CV LAB;  Service: Cardiovascular;  Laterality: N/A;   BREAST SURGERY Left    "took a gland out; milk duct"   CARDIAC CATHETERIZATION  2008   has had 2 procedures, the last one approx 2008, never had PCI/stent. Dr Bettina Gavia   CARPAL TUNNEL RELEASE Left    DILATION AND CURETTAGE OF UTERUS     "related to heavy bleeding"   INGUINAL HERNIA REPAIR Left    KNEE SURGERY Left 10/2018   LACRIMAL DUCT  EXPLORATION Right 04/01/2018   Procedure: LACRIMAL DUCT EXPLORATION AND ETHMOIDECTOMY;  Surgeon: Clista Bernhardt, MD;  Location: [place];  Service: Ophthalmology;  Laterality: Right;   SHOULDER ARTHROSCOPY WITH ROTATOR CUFF REPAIR Right    TEAR DUCT PROBING Right 04/01/2018   Procedure: TEAR DUCT PROBING WITH STENT;  Surgeon: Clista Bernhardt, MD;  Location: [place];  Service: Ophthalmology;  Laterality: Right;   TRACHEOSTOMY  2006   "closed on it's own"   TUBAL LIGATION     VAGINAL HYSTERECTOMY     "fibroids"    Current Medications: Current Meds  Medication Sig   acetaminophen (TYLENOL) 500 MG tablet Take 1,000 mg by mouth 2 (two) times daily as needed for mild pain or headache.   albuterol (PROVENTIL HFA;VENTOLIN HFA)  108 (90 Base) MCG/ACT inhaler Inhale 2 puffs into the lungs every 6 (six) hours as needed for wheezing or shortness of breath.   budesonide-formoterol (SYMBICORT) 160-4.5 MCG/ACT inhaler Inhale 2 puffs into the lungs in the morning and at bedtime.    Calcium Carbonate-Vitamin D (CALCIUM 600+D PO) Take 1 tablet by mouth daily.    escitalopram (LEXAPRO) 20 MG tablet TAKE ONE TABLET BY MOUTH ONCE DAILY   ezetimibe (ZETIA) 10 MG tablet Take 0.5 tablets (5 mg total) by mouth daily.   fluticasone (FLONASE) 50 MCG/ACT nasal spray Place 2 sprays into both nostrils daily as needed for allergies or rhinitis.   furosemide (LASIX) 20 MG tablet Take 1 tablet (20 mg total) by mouth daily. Take only one daily if you weigh 124lbs or less. (Patient taking differently: Take 20 mg by mouth daily.)   LORazepam (ATIVAN) 1 MG tablet Take 1 mg by mouth 3 (three) times daily as needed for anxiety.    nitroGLYCERIN (NITROSTAT) 0.4 MG SL tablet DISSOLVE 1 TABLET UNDER THE TONGUE EVERY 5 MINUTES AS NEEDED FOR CHEST PAIN. DO NOT EXCEED A TOTAL OF 3 DOSES IN 15 MINUTES.   pantoprazole (PROTONIX) 40 MG tablet Take 40 mg by mouth daily.   potassium chloride SA (KLOR-CON) 20 MEQ tablet TAKE 1/2 TABLET BY  MOUTH TWICE DAILY   sacubitril-valsartan (ENTRESTO) 97-103 MG Take 1 tablet by mouth 2 (two) times daily.   traMADol (ULTRAM) 50 MG tablet Take 50-100 mg by mouth every 6 (six) hours as needed for moderate pain.   XARELTO 20 MG TABS tablet TAKE ONE TABLET BY MOUTH ONCE DAILY   [DISCONTINUED] nitroGLYCERIN (NITROSTAT) 0.4 MG SL tablet DISSOLVE 1 TABLET UNDER THE TONGUE EVERY 5 MINUTES AS NEEDED FOR CHEST PAIN. DO NOT EXCEED A TOTAL OF 3 DOSES IN 15 MINUTES.     Allergies:   Atorvastatin, Cefdinir, and Levofloxacin   Social History   Socioeconomic History   Marital status: Married    Spouse name: Not on file   Number of children: Not on file   Years of education: Not on file   Highest education level: Not on file  Occupational History   Not on file  Tobacco Use   Smoking status: Every Day    Packs/day: 0.50    Years: 34.00    Pack years: 17.00    Types: Cigarettes   Smokeless tobacco: Never  Vaping Use   Vaping Use: Former   Quit date: 03/23/2004  Substance and Sexual Activity   Alcohol use: No   Drug use: No   Sexual activity: Not Currently  Other Topics Concern   Not on file  Social History Narrative   Not on file   Social Determinants of Health   Financial Resource Strain: Not on file  Food Insecurity: Not on file  Transportation Needs: Not on file  Physical Activity: Not on file  Stress: Not on file  Social Connections: Not on file     Family History: The patient's family history includes CAD in her brother; Heart disease in her brother. ROS:   Please see the history of present illness.    All other systems reviewed and are negative.  EKGs/Labs/Other Studies Reviewed:    The following studies were reviewed today:  EKG:  EKG ordered today and personally reviewed.  The ekg ordered today demonstrates sinus rhythm left bundle branch block and APCs  Recent Labs: 11/12/2020: ALT 11; BUN 10; Creatinine, Ser 0.60; NT-Pro BNP 190; Potassium 3.9; Sodium 134  Recent  Lipid Panel    Component Value Date/Time   CHOL 211 (H) 06/26/2021 1120   TRIG 120 06/26/2021 1120   HDL 68 06/26/2021 1120   CHOLHDL 3.1 06/26/2021 1120   CHOLHDL 3.2 06/11/2017 1439   VLDL 20 06/11/2017 1439   LDLCALC 122 (H) 06/26/2021 1120    Physical Exam:    VS:  BP 124/70 (BP Location: Right Arm)    Pulse 83    Ht 5' (1.524 m)    Wt 119 lb 9.6 oz (54.3 kg)    SpO2 97%    BMI 23.36 kg/m     Wt Readings from Last 3 Encounters:  11/11/21 119 lb 9.6 oz (54.3 kg)  06/03/21 130 lb (59 kg)  05/14/21 129 lb 9.6 oz (58.8 kg)     GEN:  Well nourished, well developed in no acute distress HEENT: Normal NECK: No JVD; No carotid bruits LYMPHATICS: No lymphadenopathy CARDIAC: Paradoxical second heart sound RRR, no murmurs, rubs, gallops RESPIRATORY:  Clear to auscultation without rales, wheezing or rhonchi  ABDOMEN: Soft, non-tender, non-distended MUSCULOSKELETAL:  No edema; No deformity  SKIN: Warm and dry NEUROLOGIC:  Alert and oriented x 3 PSYCHIATRIC:  Normal affect    Signed, Shirlee More, MD  11/11/2021 11:34 AM    [place]

## 2021-11-11 ENCOUNTER — Ambulatory Visit: Payer: PPO | Admitting: Cardiology

## 2021-11-11 ENCOUNTER — Encounter: Payer: Self-pay | Admitting: Cardiology

## 2021-11-11 ENCOUNTER — Other Ambulatory Visit: Payer: Self-pay

## 2021-11-11 VITALS — BP 124/70 | HR 83 | Ht 60.0 in | Wt 119.6 lb

## 2021-11-11 DIAGNOSIS — I110 Hypertensive heart disease with heart failure: Secondary | ICD-10-CM | POA: Diagnosis not present

## 2021-11-11 DIAGNOSIS — Z7901 Long term (current) use of anticoagulants: Secondary | ICD-10-CM | POA: Diagnosis not present

## 2021-11-11 DIAGNOSIS — E782 Mixed hyperlipidemia: Secondary | ICD-10-CM | POA: Diagnosis not present

## 2021-11-11 DIAGNOSIS — T466X5A Adverse effect of antihyperlipidemic and antiarteriosclerotic drugs, initial encounter: Secondary | ICD-10-CM

## 2021-11-11 DIAGNOSIS — I480 Paroxysmal atrial fibrillation: Secondary | ICD-10-CM | POA: Diagnosis not present

## 2021-11-11 DIAGNOSIS — J449 Chronic obstructive pulmonary disease, unspecified: Secondary | ICD-10-CM

## 2021-11-11 DIAGNOSIS — I25119 Atherosclerotic heart disease of native coronary artery with unspecified angina pectoris: Secondary | ICD-10-CM

## 2021-11-11 DIAGNOSIS — G720 Drug-induced myopathy: Secondary | ICD-10-CM

## 2021-11-11 DIAGNOSIS — T466X5D Adverse effect of antihyperlipidemic and antiarteriosclerotic drugs, subsequent encounter: Secondary | ICD-10-CM

## 2021-11-11 MED ORDER — NITROGLYCERIN 0.4 MG SL SUBL: SUBLINGUAL_TABLET | SUBLINGUAL | 3 refills | Status: DC

## 2021-11-11 NOTE — Patient Instructions (Signed)

## 2021-11-14 ENCOUNTER — Other Ambulatory Visit: Payer: Self-pay

## 2021-11-14 DIAGNOSIS — I5042 Chronic combined systolic (congestive) and diastolic (congestive) heart failure: Secondary | ICD-10-CM

## 2021-11-14 MED ORDER — ENTRESTO 97-103 MG PO TABS: 1.0000 | ORAL_TABLET | Freq: Two times a day (BID) | ORAL | 3 refills | Status: DC

## 2021-11-25 ENCOUNTER — Ambulatory Visit: Payer: PPO | Admitting: Cardiology

## 2022-01-10 ENCOUNTER — Other Ambulatory Visit: Payer: Self-pay | Admitting: Cardiology

## 2022-01-10 DIAGNOSIS — I5043 Acute on chronic combined systolic (congestive) and diastolic (congestive) heart failure: Secondary | ICD-10-CM

## 2022-01-23 ENCOUNTER — Telehealth: Payer: Self-pay

## 2022-01-23 DIAGNOSIS — Z01818 Encounter for other preprocedural examination: Secondary | ICD-10-CM

## 2022-01-23 NOTE — Telephone Encounter (Signed)
? ?  [facility] Pre-operative Risk Assessment  ?  ?Request for surgical clearance: ? ?What type of surgery is being performed? Anterior Cervical Discectomy and Interbody Fusion   ? ?When is this surgery scheduled? TBD  ? ?What type of clearance is required (medical clearance vs. Pharmacy clearance to hold med vs. Both)? Both ? ?Are there any medications that need to be held prior to surgery and how long?Not specified however the patient is on Xarelto.   ? ?Practice name and name of physician performing surgery? Dr. Creig Hines at [facility]   ? ?What is your office phone number: [phone]  ?  ?7.   What is your office fax number: [phone] ? ?8.   Anesthesia type (None, local, MAC, general) ? General Anesthesia  ? ? ?Bensen Chadderdon M Osbaldo Mark ?01/23/2022, 10:39 AM  ?_________________________________________________________________ ?  (provider comments below)  ?

## 2022-01-24 NOTE — Telephone Encounter (Signed)
Patient with diagnosis of afib on Xarelto for anticoagulation.   ? ?Procedure: anterior cervical discectomy and interbody fusion ?Date of procedure: TBD ? ?CHA2DS2-VASc Score = 5  ?This indicates a 7.2% annual risk of stroke. ?The patient's score is based upon: ?CHF History: 1 ?HTN History: 1 ?Diabetes History: 0 ?Stroke History: 0 ?Vascular Disease History: 1 ?Age Score: 1 ?Gender Score: 1 ?  ?CrCl 78mL/min ?Platelet count 365K ? ?Per office protocol, patient can hold Xarelto for 3 days prior to procedure.   ? ?

## 2022-01-24 NOTE — Telephone Encounter (Signed)
? ?  Patient Name: Jennifer Martin  ?DOB: 1956/10/05 ?MRN: [id number] ? ?Primary Cardiologist: [name], MD ? ?Chart reviewed as part of pre-operative protocol coverage.  Patient has an upcoming appointment scheduled with Dr. Elberta Fortis on 02/24/2022 at which time surgical clearance can be addressed in case there are any issues that would impact surgical recommendations. ? ?Anterior cervical discectomy and interbody fusion date TBD as below. I added preop FYI to appointment notes that provider is aware to address at time of outpatient visit.  Per office protocol the cardiology provider should for their finalize clearance decision and recommendations regarding antiplatelet therapy to the requesting party below. ? ?Patient with diagnosis of afib on Xarelto for anticoagulation.   ?  ?Procedure: anterior cervical discectomy and interbody fusion ?Date of procedure: TBD ?  ?CHA2DS2-VASc Score = 5  ?This indicates a 7.2% annual risk of stroke. ?The patient's score is based upon: ?CHF History: 1 ?HTN History: 1 ?Diabetes History: 0 ?Stroke History: 0 ?Vascular Disease History: 1 ?Age Score: 1 ?Gender Score: 1 ?  ?CrCl 54mL/min ?Platelet count 365K ?  ?Per office protocol, patient can hold Xarelto for 3 days prior to procedure.   ? ?I will route this message as FYI to the requesting party and remove this message from the preop box as separate preop APP input not needed at this time.  Please call with any questions. ? ?Joylene Grapes, NP ?01/24/2022, 10:43 AM ? ?

## 2022-01-28 DIAGNOSIS — M4722 Other spondylosis with radiculopathy, cervical region: Secondary | ICD-10-CM

## 2022-01-28 HISTORY — DX: Other spondylosis with radiculopathy, cervical region: M47.22

## 2022-02-24 ENCOUNTER — Encounter: Payer: Self-pay | Admitting: Cardiology

## 2022-02-24 ENCOUNTER — Other Ambulatory Visit: Payer: Self-pay

## 2022-02-24 ENCOUNTER — Ambulatory Visit: Payer: PPO | Admitting: Cardiology

## 2022-02-24 VITALS — BP 106/68 | HR 64 | Ht 60.0 in | Wt 121.6 lb

## 2022-02-24 DIAGNOSIS — I4819 Other persistent atrial fibrillation: Secondary | ICD-10-CM

## 2022-02-24 NOTE — Progress Notes (Signed)
? ?Electrophysiology Office Note ? ? ?Date:  02/24/2022  ? ?ID:  Jennifer Martin, DOB 04-13-1956, MRN [id number] ? ?PCP:  [name]., MD  ?Cardiologist:  [name] ?Primary Electrophysiologist:  Jamica Woodyard [name], MD   ? ?No chief complaint on file. ? ? ?  ?History of Present Illness: ?Jennifer Martin is a 66 y.o. female who is being seen today for the evaluation of atrial flutter at the request of [name]. Presenting today for electrophysiology evaluation.  ? ?She has a history seen for coronary artery disease, hyperlipidemia, hypertension, atrial fibrillation, atrial flutter.  She has COPD and demand ischemia with heart failure.  She was admitted to [facility] with atrial fibrillation.  She is status post ablation for atrial fibrillation and flutter 05/29/2017. ? ?Today, denies symptoms of palpitations, chest pain, shortness of breath, orthopnea, PND, lower extremity edema, claudication, dizziness, presyncope, syncope, bleeding, or neurologic sequela. The patient is tolerating medications without difficulties.  Since being seen she has done well.  She has no chest pain or shortness of breath.  She states that she can have episodes of atrial fibrillation when she drinks caffeine but otherwise has no complaints.  She is planned for neck surgery.  She needs fusion of multiple levels.  She has no chest pain.  She can walk on flat ground without issue and can climb stairs. ? ?Past Medical History:  ?Diagnosis Date  ? Acute pulmonary edema (HCC)   ? Anxiety   ? Aortic regurgitation 06/17/2017  ? Mild to moderate   ? Atherosclerosis of coronary artery of native heart with angina pectoris (HCC)   ? Atrial flutter (HCC)   ? CAD (coronary artery disease)   ? CHF (congestive heart failure) (HCC)   ? Chronic anticoagulation 04/13/2017  ? Chronic combined systolic and diastolic heart failure (HCC) 04/13/2017  ? varying EF in the past - 45-50% in 09/2016, 37% by echo 08/2016,  61% by nuc 01/2017 then 30-35% this admission  (40% by review from Dr. Mayford Knife)  ? Chronic obstructive pulmonary disease (HCC) 10/10/2016  ? COPD (chronic obstructive pulmonary disease) (HCC)   ? Coronary artery disease involving native coronary artery of native heart with angina pectoris (HCC) 03/25/2017  ? Overview:  CTO of LCF Lexiscan MPS 07/28/13 with no ischemia, EF 50%  ? Demand ischemia (HCC)   ? Depression   ? Difficult intubation   ? " small airway and need a little tube "  ? Diverticulosis   ? GERD (gastroesophageal reflux disease)   ? Gout 05/12/2016  ? Last Assessment & Plan:  She needs her uric acid level checked   ? High risk medication use 05/12/2016  ? History of atrial fibrillation 06/28/2015  ? Ablation 2018 follows with EP provider on xarelto  Last Assessment & Plan:  This is stable for her Last Assessment & Plan:  She has not had any episodes of this stable  ? Hyperlipidemia   ? Hypertension   ? Inappropriate ADH syndrome (HCC)   ? Inguinal hernia 05/12/2016  ? LBBB (left bundle branch block)   ? Left bundle branch block (LBBB) 06/28/2015  ? Malaise and fatigue 05/12/2016  ? Last Assessment & Plan:  Update her TSh for her and her CBC, she is advised to stop smoking   ? Migraine   ? "none since ~ 2012; mild ones then when I did have them because of the beta blockers I was on" (06/09/2017)  ? Mild episode of recurrent major depressive disorder (  HCC) 09/27/2016  ? Mitral regurgitation 06/17/2017  ? Mild   ? Mobitz type 1 second degree AV block   ? Myocardial infarction [facility])   ? "I've had light ones" (06/09/2017)  ? Near syncope   ? On amiodarone therapy 04/13/2017  ? Osteoarthritis   ? Palpitation 01/13/2018  ? Paroxysmal atrial fibrillation (HCC) 05/29/2017  ? Pneumonia   ? "couple times" (06/09/2017)  ? Preoperative cardiovascular examination 10/01/2018  ? SOB (shortness of breath) on exertion   ? Statin intolerance 09/22/2019  ? Tobacco dependence 09/30/2018  ? V-tach (HCC) 06/28/2015  ? ?Past Surgical History:  ?Procedure Laterality Date  ? ATRIAL FIBRILLATION  ABLATION N/A 05/29/2017  ? Procedure: Atrial Fibrillation Ablation;  Surgeon: Regan Lemming, MD;  Location: [facility] INVASIVE CV LAB;  Service: Cardiovascular;  Laterality: N/A;  ? BREAST SURGERY Left   ? "took a gland out; milk duct"  ? CARDIAC CATHETERIZATION  2008  ? has had 2 procedures, the last one approx 2008, never had PCI/stent. Dr [name]  ? CARPAL TUNNEL RELEASE Left   ? DILATION AND CURETTAGE OF UTERUS    ? "related to heavy bleeding"  ? INGUINAL HERNIA REPAIR Left   ? KNEE SURGERY Left 10/2018  ? LACRIMAL DUCT EXPLORATION Right 04/01/2018  ? Procedure: LACRIMAL DUCT EXPLORATION AND ETHMOIDECTOMY;  Surgeon: Floydene Flock, MD;  Location: [facility] OR;  Service: Ophthalmology;  Laterality: Right;  ? SHOULDER ARTHROSCOPY WITH ROTATOR CUFF REPAIR Right   ? TEAR DUCT PROBING Right 04/01/2018  ? Procedure: TEAR DUCT PROBING WITH STENT;  Surgeon: Floydene Flock, MD;  Location: [facility] OR;  Service: Ophthalmology;  Laterality: Right;  ? TRACHEOSTOMY  2006  ? "closed on it's own"  ? TUBAL LIGATION    ? VAGINAL HYSTERECTOMY    ? "fibroids"  ? ? ? ?Current Outpatient Medications  ?Medication Sig Dispense Refill  ? acetaminophen (TYLENOL) 500 MG tablet Take 1,000 mg by mouth 2 (two) times daily as needed for mild pain or headache.    ? albuterol (PROVENTIL HFA;VENTOLIN HFA) 108 (90 Base) MCG/ACT inhaler Inhale 2 puffs into the lungs every 6 (six) hours as needed for wheezing or shortness of breath.    ? budesonide-formoterol (SYMBICORT) 160-4.5 MCG/ACT inhaler Inhale 2 puffs into the lungs in the morning and at bedtime.     ? escitalopram (LEXAPRO) 20 MG tablet TAKE ONE TABLET BY MOUTH ONCE DAILY    ? fluticasone (FLONASE) 50 MCG/ACT nasal spray Place 2 sprays into both nostrils daily as needed for allergies or rhinitis.    ? LORazepam (ATIVAN) 1 MG tablet Take 1 mg by mouth 3 (three) times daily as needed for anxiety.     ? nitroGLYCERIN (NITROSTAT) 0.4 MG SL tablet DISSOLVE 1 TABLET UNDER THE TONGUE EVERY 5 MINUTES AS  NEEDED FOR CHEST PAIN. DO NOT EXCEED A TOTAL OF 3 DOSES IN 15 MINUTES. 25 tablet 3  ? OVER THE COUNTER MEDICATION Take 1 capsule by mouth daily. calcium manesium zinc    ? pantoprazole (PROTONIX) 40 MG tablet Take 40 mg by mouth daily.    ? potassium chloride SA (KLOR-CON M) 20 MEQ tablet Take 0.5 tablets (10 mEq total) by mouth 2 (two) times daily. 90 tablet 2  ? sacubitril-valsartan (ENTRESTO) 97-103 MG Take 1 tablet by mouth 2 (two) times daily. 180 tablet 3  ? traMADol (ULTRAM) 50 MG tablet Take 50-100 mg by mouth every 6 (six) hours as needed for moderate pain.    ? XARELTO 20  MG TABS tablet TAKE ONE TABLET BY MOUTH ONCE DAILY 90 tablet 1  ? ezetimibe (ZETIA) 10 MG tablet Take 0.5 tablets (5 mg total) by mouth daily. 45 tablet 3  ? furosemide (LASIX) 20 MG tablet Take 1 tablet (20 mg total) by mouth daily. Take only one daily if you weigh 124lbs or less. (Patient taking differently: Take 20 mg by mouth daily.) 180 tablet 1  ? ?No current facility-administered medications for this visit.  ? ? ?Allergies:   Atorvastatin, Cefdinir, and Levofloxacin  ? ?Social History:  The patient  reports that she has been smoking cigarettes. She has a 17.00 pack-year smoking history. She has never used smokeless tobacco. She reports that she does not drink alcohol and does not use drugs.  ? ?Family History:  The patient's family history includes CAD in her brother; Heart disease in her brother.  ? ?ROS:  Please see the history of present illness.   Otherwise, review of systems is positive for none.   All other systems are reviewed and negative.  ? ?PHYSICAL EXAM: ?VS:  BP 106/68   Pulse 64   Ht 5' (1.524 m)   Wt 121 lb 9.6 oz (55.2 kg)   SpO2 96%   BMI 23.75 kg/m?  , BMI Body mass index is 23.75 kg/m?. ?GEN: Well nourished, well developed, in no acute distress  ?HEENT: normal  ?Neck: no JVD, carotid bruits, or masses ?Cardiac: RRR; no murmurs, rubs, or gallops,no edema  ?Respiratory:  clear to auscultation bilaterally,  normal work of breathing ?GI: soft, nontender, nondistended, + BS ?MS: no deformity or atrophy  ?Skin: warm and dry ?Neuro:  Strength and sensation are intact ?Psych: euthymic mood, full affect ? ?EKG:  EKG is

## 2022-02-24 NOTE — Progress Notes (Signed)
Opened in error

## 2022-02-27 NOTE — Telephone Encounter (Signed)
Preoperative team, patient was cleared at recent 02/24/2022 clinic note.  Also, recommendations for holding Xarelto were made in previous preoperative cardiac evaluation.  Please refax clinic note/recommendations to requesting office.  Thank you. ? ?Jossie Ng. Athena Baltz NP-C ? ?  ?02/27/2022, 9:18 AM ?[place] ?[address] ?Office [phone] Fax [phone] ? ?

## 2022-02-27 NOTE — Telephone Encounter (Signed)
Jasmine December from [facility] called to check the status of the surgical clearance. Please send clearance to fax # [phone] ?

## 2022-03-10 HISTORY — PX: ANTERIOR CERVICAL DISCECTOMY: SHX1160

## 2022-03-11 DIAGNOSIS — I2510 Atherosclerotic heart disease of native coronary artery without angina pectoris: Secondary | ICD-10-CM

## 2022-03-11 DIAGNOSIS — E785 Hyperlipidemia, unspecified: Secondary | ICD-10-CM

## 2022-03-11 DIAGNOSIS — I10 Essential (primary) hypertension: Secondary | ICD-10-CM

## 2022-03-11 DIAGNOSIS — E871 Hypo-osmolality and hyponatremia: Secondary | ICD-10-CM

## 2022-03-11 DIAGNOSIS — I4891 Unspecified atrial fibrillation: Secondary | ICD-10-CM

## 2022-03-11 DIAGNOSIS — I447 Left bundle-branch block, unspecified: Secondary | ICD-10-CM

## 2022-03-11 DIAGNOSIS — I509 Heart failure, unspecified: Secondary | ICD-10-CM

## 2022-03-12 DIAGNOSIS — I4891 Unspecified atrial fibrillation: Secondary | ICD-10-CM | POA: Diagnosis not present

## 2022-03-12 DIAGNOSIS — I447 Left bundle-branch block, unspecified: Secondary | ICD-10-CM | POA: Diagnosis not present

## 2022-03-12 DIAGNOSIS — E871 Hypo-osmolality and hyponatremia: Secondary | ICD-10-CM | POA: Diagnosis not present

## 2022-03-12 DIAGNOSIS — I509 Heart failure, unspecified: Secondary | ICD-10-CM | POA: Diagnosis not present

## 2022-03-13 DIAGNOSIS — I447 Left bundle-branch block, unspecified: Secondary | ICD-10-CM | POA: Diagnosis not present

## 2022-03-13 DIAGNOSIS — I493 Ventricular premature depolarization: Secondary | ICD-10-CM

## 2022-03-13 DIAGNOSIS — I4891 Unspecified atrial fibrillation: Secondary | ICD-10-CM | POA: Diagnosis not present

## 2022-03-13 DIAGNOSIS — I509 Heart failure, unspecified: Secondary | ICD-10-CM | POA: Diagnosis not present

## 2022-03-13 DIAGNOSIS — I471 Supraventricular tachycardia: Secondary | ICD-10-CM

## 2022-03-13 DIAGNOSIS — E871 Hypo-osmolality and hyponatremia: Secondary | ICD-10-CM | POA: Diagnosis not present

## 2022-04-03 ENCOUNTER — Telehealth: Payer: Self-pay | Admitting: Cardiology

## 2022-04-03 ENCOUNTER — Other Ambulatory Visit: Payer: Self-pay

## 2022-04-03 ENCOUNTER — Telehealth: Payer: Self-pay

## 2022-04-03 NOTE — Telephone Encounter (Signed)
Pt states that after having a recent surgery the hospital referred her to a Endocrinologist. Pt doesn't think that she needs to see one and would like a second option. Pt would like for nurse to call her back. Please advise

## 2022-04-03 NOTE — Telephone Encounter (Signed)
Spoke with pt. She stated that while in the hospital recently the hospitalist put her on Hydrocortisone 10mg  q daily. She stated that she has not been able to sleep and is asking if she can cut it in half or even better, stop it. Blood pressures are 142/72 HR-72 109/65 HR-67 125/77 HR-75 132/79 HR 72.  She has no other complaints. She will see you 04-28-22. Please Advise.

## 2022-04-04 ENCOUNTER — Telehealth: Payer: Self-pay

## 2022-04-04 NOTE — Telephone Encounter (Signed)
Spoke with pt and advised of Dr. Hulen Shouts note. She stated that she would ask her Pcp and also see the Endocrinologist as advised. She had no further questions or concerns.

## 2022-04-14 ENCOUNTER — Telehealth: Payer: Self-pay | Admitting: Cardiology

## 2022-04-14 DIAGNOSIS — E271 Primary adrenocortical insufficiency: Secondary | ICD-10-CM

## 2022-04-14 HISTORY — DX: Primary adrenocortical insufficiency: E27.1

## 2022-04-15 MED ORDER — METOPROLOL TARTRATE 25 MG PO TABS: 25.0000 mg | ORAL_TABLET | Freq: Two times a day (BID) | ORAL | 0 refills | Status: DC

## 2022-04-25 ENCOUNTER — Other Ambulatory Visit: Payer: Self-pay | Admitting: Cardiology

## 2022-04-25 ENCOUNTER — Encounter: Payer: Self-pay | Admitting: Cardiology

## 2022-04-25 DIAGNOSIS — I480 Paroxysmal atrial fibrillation: Secondary | ICD-10-CM

## 2022-04-25 DIAGNOSIS — I4892 Unspecified atrial flutter: Secondary | ICD-10-CM

## 2022-04-25 DIAGNOSIS — I25118 Atherosclerotic heart disease of native coronary artery with other forms of angina pectoris: Secondary | ICD-10-CM

## 2022-04-25 NOTE — Telephone Encounter (Signed)
Prescription refill request for Xarelto received.  Indication: Atrial Fib Last office visit: 02/24/22  Carleene Mains MD Weight: 55.2kg Age: 66 Scr: 0.49 on 04/14/22 CrCl: 97.09  Based on above findings Xarelto 20mg  daily is the appropriate dose.  Refill approved.

## 2022-04-26 NOTE — Progress Notes (Unsigned)
Cardiology Office Note:    Date:  04/26/2022   ID:  Jennifer Martin, DOB June 01, 1956, MRN [id number]  PCP:  [name]., MD  Cardiologist:  [name], MD    Referring MD: [name]., MD    ASSESSMENT:    No diagnosis found. PLAN:    In order of problems listed above:  ***   Next appointment: ***   Medication Adjustments/Labs and Tests Ordered: Current medicines are reviewed at length with the patient today.  Concerns regarding medicines are outlined above.  No orders of the defined types were placed in this encounter.  No orders of the defined types were placed in this encounter.   No chief complaint on file.   History of Present Illness:    Jennifer Martin is a 66 y.o. female with a hx of complex heart disease including CAD heart failure with normalization of ejection fraction on guideline directed therapy hypertensive heart disease with heart failure paroxysmal atrial fibrillation a flutter status post EP ablation 2018 hyperlipidemia COPD and left bundle branch block and statin intolerance with myopathy last seen as an inpatient at [facility] in June.  She had recent spine surgery at [facility] complicated by severe junctional hyponatremia as well as atrial and ventricular arrhythmia and was placed on a beta-blocker. Compliance with diet, lifestyle and medications: *** Past Medical History:  Diagnosis Date   Acute pulmonary edema (HCC)    Anxiety    Aortic regurgitation 06/17/2017   Mild to moderate    Atherosclerosis of coronary artery of native heart with angina pectoris [facility])    Atrial flutter (HCC)    CAD (coronary artery disease)    CHF (congestive heart failure) (HCC)    Chronic anticoagulation 04/13/2017   Chronic combined systolic and diastolic heart failure (HCC) 04/13/2017   varying EF in the past - 45-50% in 09/2016, 37% by echo 08/2016,  61% by nuc 01/2017 then 30-35% this admission (40% by review from Dr. Mayford Knife)   Chronic obstructive  pulmonary disease (HCC) 10/10/2016   COPD (chronic obstructive pulmonary disease) (HCC)    Coronary artery disease involving native coronary artery of native heart with angina pectoris (HCC) 03/25/2017   Overview:  CTO of LCF Lexiscan MPS 07/28/13 with no ischemia, EF 50%   Demand ischemia (HCC)    Depression    Difficult intubation    " small airway and need a little tube "   Diverticulosis    GERD (gastroesophageal reflux disease)    Gout 05/12/2016   Last Assessment & Plan:  She needs her uric acid level checked    High risk medication use 05/12/2016   History of atrial fibrillation 06/28/2015   Ablation 2018 follows with EP provider on xarelto  Last Assessment & Plan:  This is stable for her Last Assessment & Plan:  She has not had any episodes of this stable   Hyperlipidemia    Hypertension    Inappropriate ADH syndrome (HCC)    Inguinal hernia 05/12/2016   LBBB (left bundle branch block)    Left bundle branch block (LBBB) 06/28/2015   Malaise and fatigue 05/12/2016   Last Assessment & Plan:  Update her TSh for her and her CBC, she is advised to stop smoking    Migraine    "none since ~ 2012; mild ones then when I did have them because of the beta blockers I was on" (06/09/2017)   Mild episode of recurrent major depressive disorder (HCC) 09/27/2016  Mitral regurgitation 06/17/2017   Mild    Mobitz type 1 second degree AV block    Myocardial infarction (HCC)    "I've had light ones" (06/09/2017)   Near syncope    On amiodarone therapy 04/13/2017   Osteoarthritis    Palpitation 01/13/2018   Paroxysmal atrial fibrillation (HCC) 05/29/2017   Pneumonia    "couple times" (06/09/2017)   Preoperative cardiovascular examination 10/01/2018   SOB (shortness of breath) on exertion    Statin intolerance 09/22/2019   Tobacco dependence 09/30/2018   V-tach (HCC) 06/28/2015    Past Surgical History:  Procedure Laterality Date   ANTERIOR CERVICAL DISCECTOMY  03/10/2022   Done at [facility]   ATRIAL  FIBRILLATION ABLATION N/A 05/29/2017   Procedure: Atrial Fibrillation Ablation;  Surgeon: Regan Lemming, MD;  Location: [facility] INVASIVE CV LAB;  Service: Cardiovascular;  Laterality: N/A;   BREAST SURGERY Left    "took a gland out; milk duct"   CARDIAC CATHETERIZATION  2008   has had 2 procedures, the last one approx 2008, never had PCI/stent. Dr [name]   CARPAL TUNNEL RELEASE Left    DILATION AND CURETTAGE OF UTERUS     "related to heavy bleeding"   INGUINAL HERNIA REPAIR Left    KNEE SURGERY Left 10/2018   LACRIMAL DUCT EXPLORATION Right 04/01/2018   Procedure: LACRIMAL DUCT EXPLORATION AND ETHMOIDECTOMY;  Surgeon: Floydene Flock, MD;  Location: [facility] OR;  Service: Ophthalmology;  Laterality: Right;   SHOULDER ARTHROSCOPY WITH ROTATOR CUFF REPAIR Right    TEAR DUCT PROBING Right 04/01/2018   Procedure: TEAR DUCT PROBING WITH STENT;  Surgeon: Floydene Flock, MD;  Location: [facility] OR;  Service: Ophthalmology;  Laterality: Right;   TRACHEOSTOMY  2006   "closed on it's own"   TUBAL LIGATION     VAGINAL HYSTERECTOMY     "fibroids"    Current Medications: No outpatient medications have been marked as taking for the 04/28/22 encounter (Appointment) with Baldo Daub, MD.     Allergies:   Atorvastatin, Cefdinir, and Levofloxacin   Social History   Socioeconomic History   Marital status: Married    Spouse name: Not on file   Number of children: Not on file   Years of education: Not on file   Highest education level: Not on file  Occupational History   Not on file  Tobacco Use   Smoking status: Every Day    Packs/day: 0.50    Years: 34.00    Total pack years: 17.00    Types: Cigarettes   Smokeless tobacco: Never  Vaping Use   Vaping Use: Former   Quit date: 03/23/2004  Substance and Sexual Activity   Alcohol use: No   Drug use: No   Sexual activity: Not Currently  Other Topics Concern   Not on file  Social History Narrative   Not on file   Social Determinants of  Health   Financial Resource Strain: Not on file  Food Insecurity: Not on file  Transportation Needs: Not on file  Physical Activity: Not on file  Stress: Not on file  Social Connections: Not on file     Family History: The patient's ***family history includes CAD in her brother; Heart disease in her brother. ROS:   Please see the history of present illness.    All other systems reviewed and are negative.  EKGs/Labs/Other Studies Reviewed:    The following studies were reviewed today:  EKG:  EKG ordered today and personally reviewed.  The ekg ordered today demonstrates ***  Recent Labs: No results found for requested labs within last 365 days.  Recent Lipid Panel    Component Value Date/Time   CHOL 211 (H) 06/26/2021 1120   TRIG 120 06/26/2021 1120   HDL 68 06/26/2021 1120   CHOLHDL 3.1 06/26/2021 1120   CHOLHDL 3.2 06/11/2017 1439   VLDL 20 06/11/2017 1439   LDLCALC 122 (H) 06/26/2021 1120    Physical Exam:    VS:  There were no vitals taken for this visit.    Wt Readings from Last 3 Encounters:  02/24/22 121 lb 9.6 oz (55.2 kg)  11/11/21 119 lb 9.6 oz (54.3 kg)  06/03/21 130 lb (59 kg)     GEN: *** Well nourished, well developed in no acute distress HEENT: Normal NECK: No JVD; No carotid bruits LYMPHATICS: No lymphadenopathy CARDIAC: ***RRR, no murmurs, rubs, gallops RESPIRATORY:  Clear to auscultation without rales, wheezing or rhonchi  ABDOMEN: Soft, non-tender, non-distended MUSCULOSKELETAL:  No edema; No deformity  SKIN: Warm and dry NEUROLOGIC:  Alert and oriented x 3 PSYCHIATRIC:  Normal affect    Signed, [name], MD  04/26/2022 3:27 PM    [facility]

## 2022-04-28 ENCOUNTER — Ambulatory Visit: Payer: PPO | Admitting: Cardiology

## 2022-04-28 ENCOUNTER — Encounter: Payer: Self-pay | Admitting: Cardiology

## 2022-04-28 VITALS — BP 106/74 | HR 90 | Ht 60.0 in | Wt 121.4 lb

## 2022-04-28 DIAGNOSIS — I25119 Atherosclerotic heart disease of native coronary artery with unspecified angina pectoris: Secondary | ICD-10-CM

## 2022-04-28 DIAGNOSIS — I110 Hypertensive heart disease with heart failure: Secondary | ICD-10-CM

## 2022-04-28 DIAGNOSIS — I493 Ventricular premature depolarization: Secondary | ICD-10-CM | POA: Diagnosis not present

## 2022-04-28 DIAGNOSIS — E871 Hypo-osmolality and hyponatremia: Secondary | ICD-10-CM

## 2022-04-28 DIAGNOSIS — I471 Supraventricular tachycardia: Secondary | ICD-10-CM

## 2022-04-28 NOTE — Patient Instructions (Signed)

## 2022-05-01 ENCOUNTER — Telehealth: Payer: Self-pay | Admitting: Cardiology

## 2022-05-01 NOTE — Telephone Encounter (Signed)
Pt c/o medication issue:  1. Name of Medication: Metoprolol  2. How are you currently taking this medication (dosage and times per day)? 2 times a day  3. Are you having a reaction (difficulty breathing--STAT)?   4. What is your medication issue? Feels terrible, no energy and stomach been feeling terrible, nausea- patient wants to be seen

## 2022-05-02 MED ORDER — METOPROLOL TARTRATE 25 MG PO TABS: 12.5000 mg | ORAL_TABLET | Freq: Two times a day (BID) | ORAL | 2 refills | Status: DC

## 2022-05-02 NOTE — Telephone Encounter (Signed)
I spoke with patient. She reports lack of energy and stomach upset since her spine surgery in May.  States it was a difficult surgery for her.  She was feeling good when she saw Dr [name] earlier this week and did not discuss lack of energy with him.  She states her stomach is now feeling better.   Patient asking if metoprolol could be causing her symptoms. Per last office note patient to continue beta blocker.  I told patient she should continue metoprolol and why Dr [name] had her on this medication.  She is seeing PCP on Monday and spine doctor on Tuesday.  She will discuss lack of energy and other complaints at these appointments.  I advised her to call back in a few weeks if energy level does not improve.

## 2022-05-02 NOTE — Telephone Encounter (Signed)
Patient notified.  She will decrease metoprolol tartrate to 12.5 mg by mouth twice daily.  She will monitor BP and heart rate and let us know if they become elevated.

## 2022-05-02 NOTE — Telephone Encounter (Signed)
Baldo Daub, MD  Dossie Arbour, RN       Possible lets reduce her metoprolol by 50% either once daily or one half twice daily.    Call placed to patient.  Left message to call office.

## 2022-05-02 NOTE — Telephone Encounter (Signed)
Pt is returning call. Requesting call back. Will be home until 1:30 pm. Pt states you can call her cell, as well.

## 2022-05-12 ENCOUNTER — Ambulatory Visit: Payer: PPO | Admitting: Cardiology

## 2022-06-11 ENCOUNTER — Other Ambulatory Visit: Payer: Self-pay | Admitting: Cardiology

## 2022-08-25 ENCOUNTER — Telehealth: Payer: Self-pay | Admitting: Cardiology

## 2022-08-25 NOTE — Telephone Encounter (Signed)
Patient would like to talk with Dr. Bettina Gavia or nurse about a letter that's needed to be sent in by 09/04/22 for her Entresto. Please call back

## 2022-08-25 NOTE — Telephone Encounter (Signed)
Advised to drop paperwork off at the office for completion.

## 2022-10-01 ENCOUNTER — Telehealth: Payer: Self-pay

## 2022-10-01 NOTE — Telephone Encounter (Signed)
Renewal patient assistance application with [employer] for [place] returned from patient. Provider portion completed and forward to Dr [name] for signature.

## 2022-10-06 NOTE — Telephone Encounter (Signed)
Application faxed

## 2022-10-14 NOTE — Telephone Encounter (Signed)
Application approved for Entresto until 10/20/23.

## 2022-10-15 ENCOUNTER — Other Ambulatory Visit: Payer: Self-pay | Admitting: Cardiology

## 2022-10-15 DIAGNOSIS — I5043 Acute on chronic combined systolic (congestive) and diastolic (congestive) heart failure: Secondary | ICD-10-CM

## 2022-10-16 ENCOUNTER — Other Ambulatory Visit: Payer: Self-pay | Admitting: Cardiology

## 2022-10-16 DIAGNOSIS — I5043 Acute on chronic combined systolic (congestive) and diastolic (congestive) heart failure: Secondary | ICD-10-CM

## 2022-10-23 ENCOUNTER — Telehealth: Payer: Self-pay | Admitting: Cardiology

## 2022-10-23 NOTE — Telephone Encounter (Signed)
  Patient calling the office for samples of medication:   1.  What medication and dosage are you requesting samples for?  sacubitril-valsartan (ENTRESTO) 97-103 MG    2.  Are you currently out of this medication? Yes  Pt said, per [employer] her entresto will be delivered  on Monday 10/27/22. She only have enough for today and tomorrow. She is requesting for samples that she can take over the weekend

## 2022-10-23 NOTE — Telephone Encounter (Signed)
Informed patient per Dr Shirlee More to give patient samples of Entresto 24 -26 mg that we have available in the office and patient may take 4 tablets twice a day to cover her days that she will be out of her higher dose (97-103 mg) that is scheduled to be delivered to her on 10/27/2022.  Patient voices understanding and will stop by office to pickup samples 10/24/2022 in [place] office

## 2022-11-10 DIAGNOSIS — L811 Chloasma: Secondary | ICD-10-CM

## 2022-11-10 DIAGNOSIS — N6459 Other signs and symptoms in breast: Secondary | ICD-10-CM

## 2022-11-10 HISTORY — DX: Other signs and symptoms in breast: N64.59

## 2022-11-10 HISTORY — DX: Chloasma: L81.1

## 2022-11-18 NOTE — Progress Notes (Unsigned)
Cardiology Office Note:    Date:  11/19/2022   ID:  [name], DOB [birth year], MRN [id number]  PCP:  Raina Mina., MD  Cardiologist:  Shirlee More, MD    Referring MD: Raina Mina., MD    ASSESSMENT:    1. Coronary artery disease involving native coronary artery of native heart with angina pectoris ([place])   2. Hypertensive heart disease with heart failure (HCC)   3. Paroxysmal atrial fibrillation ([place])   4. Chronic anticoagulation   5. Mixed hyperlipidemia   6. Statin myopathy    PLAN:    In order of problems listed above:  Jennifer Martin continues to do well with her multiple cardiac problems Stable CAD having no anginal discomfort continue her current medical therapy including her beta-blocker not currently on lipid-lowering treatment Persistent heart failure well compensated continue her minimal dose of diuretic beta-blocker and Entresto but reduce the dose with symptomatic orthostatic hypotension No recurrence after a fibrillation catheter ablation continue her anticoagulant Currently not on lipid-lowering therapy statin intolerant   Next appointment: 6 months   Medication Adjustments/Labs and Tests Ordered: Current medicines are reviewed at length with the patient today.  Concerns regarding medicines are outlined above.  No orders of the defined types were placed in this encounter.  Meds ordered this encounter  Medications   sacubitril-valsartan (ENTRESTO) 49-51 MG    Sig: Take 1 tablet by mouth 2 (two) times daily.    Dispense:  180 tablet    Refill:  3    Chief Complaint  Patient presents with   Follow-up   Coronary Artery Disease   Congestive Heart Failure   Atrial Fibrillation   Anticoagulation    History of Present Illness:    Jennifer Martin is a 67 y.o. female with a hx of  complex heart disease including CAD heart failure with normalization of ejection fraction on guideline directed therapy hypertensive heart disease with heart failure paroxysmal  atrial fibrillation a flutter status post EP ablation 2018 hyperlipidemia COPD and left bundle branch block and statin intolerance with myopathy last seen 04/28/2022.  Compliance with diet, lifestyle and medications: Yes  Jennifer Martin continues to do well she is not having edema orthopnea shortness of breath chest pain palpitation or syncope She has intermittent symptomatic hypotension will reduce the dose of Entresto No breakthrough episodes of atrial fibrillation and tolerates her anticoagulant without bleeding  Recent labs 11/10/2022 Cholesterol 183 non-HDL cholesterol 118 LDL 104 potassium 3.9 mild hyponatremia it has been a problem in the past sodium 130 creatinine 0.42 GFR greater than 90 cc Past Medical History:  Diagnosis Date   Acute pulmonary edema (HCC)    Anxiety    Aortic regurgitation 06/17/2017   Mild to moderate    Atherosclerosis of coronary artery of native heart with angina pectoris (HCC)    Atrial flutter (HCC)    CAD (coronary artery disease)    CHF (congestive heart failure) (HCC)    Chronic anticoagulation 04/13/2017   Chronic combined systolic and diastolic heart failure ([place]) 04/13/2017   varying EF in the past - 45-50% in 09/2016, 37% by echo 08/2016,  61% by nuc 01/2017 then 30-35% this admission (40% by review from Dr. Radford Pax)   Chronic obstructive pulmonary disease ([place]) 10/10/2016   COPD (chronic obstructive pulmonary disease) ([place])    Coronary artery disease involving native coronary artery of native heart with angina pectoris ([place]) 03/25/2017   Overview:  CTO of LCF Lexiscan MPS 07/28/13 with no ischemia, EF 50%  Demand ischemia    Depression    Difficult intubation    " small airway and need a little tube "   Diverticulosis    GERD (gastroesophageal reflux disease)    Gout 05/12/2016   Last Assessment & Plan:  She needs her uric acid level checked    High risk medication use 05/12/2016   History of atrial fibrillation 06/28/2015   Ablation 2018 follows with EP  provider on xarelto  Last Assessment & Plan:  This is stable for her Last Assessment & Plan:  She has not had any episodes of this stable   Hyperlipidemia    Hypertension    Inappropriate ADH syndrome ([place])    Inguinal hernia 05/12/2016   LBBB (left bundle branch block)    Left bundle branch block (LBBB) 06/28/2015   Malaise and fatigue 05/12/2016   Last Assessment & Plan:  Update her TSh for her and her CBC, she is advised to stop smoking    Migraine    "none since ~ 2012; mild ones then when I did have them because of the beta blockers I was on" (06/09/2017)   Mild episode of recurrent major depressive disorder ([place]) 09/27/2016   Mitral regurgitation 06/17/2017   Mild    Mobitz type 1 second degree AV block    Myocardial infarction ([place])    "I've had light ones" (06/09/2017)   Near syncope    On amiodarone therapy 04/13/2017   Osteoarthritis    Palpitation 01/13/2018   Paroxysmal atrial fibrillation ([place]) 05/29/2017   Pneumonia    "couple times" (06/09/2017)   Preoperative cardiovascular examination 10/01/2018   SOB (shortness of breath) on exertion    Statin intolerance 09/22/2019   Tobacco dependence 09/30/2018   V-tach ([place]) 06/28/2015    Past Surgical History:  Procedure Laterality Date   ANTERIOR CERVICAL DISCECTOMY  03/10/2022   Done at [facility] 05/29/2017   Procedure: Atrial Fibrillation Ablation;  Surgeon: Constance Haw, MD;  Location: [facility];  Service: Cardiovascular;  Laterality: N/A;   BREAST SURGERY Left    "took a gland out; milk duct"   CARDIAC CATHETERIZATION  2008   has had 2 procedures, the last one approx 2008, never had PCI/stent. Dr Bettina Gavia   CARPAL TUNNEL RELEASE Left    DILATION AND CURETTAGE OF UTERUS     "related to heavy bleeding"   INGUINAL HERNIA REPAIR Left    KNEE SURGERY Left 10/2018   LACRIMAL DUCT EXPLORATION Right 04/01/2018   Procedure: LACRIMAL DUCT EXPLORATION AND ETHMOIDECTOMY;  Surgeon: Clista Bernhardt, MD;  Location: [facility];  Service: Ophthalmology;  Laterality: Right;   SHOULDER ARTHROSCOPY WITH ROTATOR CUFF REPAIR Right    TEAR DUCT PROBING Right 04/01/2018   Procedure: TEAR DUCT PROBING WITH STENT;  Surgeon: Clista Bernhardt, MD;  Location: [facility];  Service: Ophthalmology;  Laterality: Right;   TRACHEOSTOMY  2006   "closed on it's own"   TUBAL LIGATION     VAGINAL HYSTERECTOMY     "fibroids"    Current Medications: Current Meds  Medication Sig   acetaminophen (TYLENOL) 500 MG tablet Take 1,000 mg by mouth 2 (two) times daily as needed for mild pain or headache.   albuterol (PROVENTIL HFA;VENTOLIN HFA) 108 (90 Base) MCG/ACT inhaler Inhale 2 puffs into the lungs every 6 (six) hours as needed for wheezing or shortness of breath.   budesonide-formoterol (SYMBICORT) 160-4.5 MCG/ACT inhaler Inhale 2 puffs into the lungs  in the morning and at bedtime.    CALCIUM CITRATE-VITAMIN D3 PO Take 1 tablet by mouth daily. Strength 600mg /500 IU   escitalopram (LEXAPRO) 20 MG tablet TAKE ONE TABLET BY MOUTH ONCE DAILY   fluticasone (FLONASE) 50 MCG/ACT nasal spray Place 2 sprays into both nostrils daily as needed for allergies or rhinitis.   furosemide (LASIX) 20 MG tablet Take 1 tablet (20 mg total) by mouth 2 (two) times daily. Take only one daily if you weigh 124lbs or less.   ipratropium-albuterol (DUONEB) 0.5-2.5 (3) MG/3ML SOLN Take 3 mLs by nebulization every 6 (six) hours as needed (wheezing).   LORazepam (ATIVAN) 1 MG tablet Take 1 mg by mouth 3 (three) times daily as needed for anxiety.    metoprolol tartrate (LOPRESSOR) 25 MG tablet Take 0.5 tablets (12.5 mg total) by mouth 2 (two) times daily.   Multiple Vitamin (MULTIVITAMIN) tablet Take 1 tablet by mouth daily.   nitroGLYCERIN (NITROSTAT) 0.4 MG SL tablet Place 0.4 mg under the tongue every 5 (five) minutes as needed for chest pain.   pantoprazole (PROTONIX) 40 MG tablet Take 40 mg by mouth daily.   potassium chloride  (KLOR-CON) 10 MEQ tablet TAKE ONE TABLET BY MOUTH TWICE DAILY   sacubitril-valsartan (ENTRESTO) 49-51 MG Take 1 tablet by mouth 2 (two) times daily.   traMADol (ULTRAM) 50 MG tablet Take 50-100 mg by mouth every 6 (six) hours as needed for moderate pain.   XARELTO 20 MG TABS tablet TAKE ONE TABLET BY MOUTH ONCE DAILY   [DISCONTINUED] sacubitril-valsartan (ENTRESTO) 97-103 MG Take 1 tablet by mouth 2 (two) times daily.     Allergies:   Atorvastatin, Cefdinir, and Levofloxacin   Social History   Socioeconomic History   Marital status: Married    Spouse name: Not on file   Number of children: Not on file   Years of education: Not on file   Highest education level: Not on file  Occupational History   Not on file  Tobacco Use   Smoking status: Every Day    Packs/day: 0.50    Years: 34.00    Total pack years: 17.00    Types: Cigarettes   Smokeless tobacco: Never  Vaping Use   Vaping Use: Former   Quit date: 03/23/2004  Substance and Sexual Activity   Alcohol use: No   Drug use: No   Sexual activity: Not Currently  Other Topics Concern   Not on file  Social History Narrative   Not on file   Social Determinants of Health   Financial Resource Strain: Not on file  Food Insecurity: Not on file  Transportation Needs: Not on file  Physical Activity: Not on file  Stress: Not on file  Social Connections: Not on file     Family History: The patient's family history includes CAD in her brother; Heart disease in her brother. ROS:   Please see the history of present illness.    All other systems reviewed and are negative.  EKGs/Labs/Other Studies Reviewed:    The following studies were reviewed today:  Recent Labs: No results found for requested labs within last 365 days.  Recent Lipid Panel    Component Value Date/Time   CHOL 211 (H) 06/26/2021 1120   TRIG 120 06/26/2021 1120   HDL 68 06/26/2021 1120   CHOLHDL 3.1 06/26/2021 1120   CHOLHDL 3.2 06/11/2017 1439   VLDL 20  06/11/2017 1439   LDLCALC 122 (H) 06/26/2021 1120    Physical Exam:  VS:  BP 100/68 (BP Location: Right Arm, Patient Position: Sitting)   Pulse 72   Ht 5' (1.524 m)   Wt 122 lb (55.3 kg)   SpO2 97%   BMI 23.83 kg/m     Wt Readings from Last 3 Encounters:  11/19/22 122 lb (55.3 kg)  04/28/22 121 lb 6.4 oz (55.1 kg)  02/24/22 121 lb 9.6 oz (55.2 kg)     GEN:  Well nourished, well developed in no acute distress HEENT: Normal NECK: No JVD; No carotid bruits LYMPHATICS: No lymphadenopathy CARDIAC: RRR, no murmurs, rubs, gallops RESPIRATORY:  Clear to auscultation without rales, wheezing or rhonchi  ABDOMEN: Soft, non-tender, non-distended MUSCULOSKELETAL:  No edema; No deformity  SKIN: Warm and dry NEUROLOGIC:  Alert and oriented x 3 PSYCHIATRIC:  Normal affect    Signed, Shirlee More, MD  11/19/2022 10:57 AM    [place]

## 2022-11-19 ENCOUNTER — Ambulatory Visit: Payer: PPO | Attending: Cardiology | Admitting: Cardiology

## 2022-11-19 ENCOUNTER — Encounter: Payer: Self-pay | Admitting: Cardiology

## 2022-11-19 VITALS — BP 100/68 | HR 72 | Ht 60.0 in | Wt 122.0 lb

## 2022-11-19 DIAGNOSIS — I110 Hypertensive heart disease with heart failure: Secondary | ICD-10-CM

## 2022-11-19 DIAGNOSIS — E782 Mixed hyperlipidemia: Secondary | ICD-10-CM

## 2022-11-19 DIAGNOSIS — I25119 Atherosclerotic heart disease of native coronary artery with unspecified angina pectoris: Secondary | ICD-10-CM | POA: Diagnosis not present

## 2022-11-19 DIAGNOSIS — I480 Paroxysmal atrial fibrillation: Secondary | ICD-10-CM

## 2022-11-19 DIAGNOSIS — G720 Drug-induced myopathy: Secondary | ICD-10-CM

## 2022-11-19 DIAGNOSIS — Z7901 Long term (current) use of anticoagulants: Secondary | ICD-10-CM

## 2022-11-19 DIAGNOSIS — T466X5D Adverse effect of antihyperlipidemic and antiarteriosclerotic drugs, subsequent encounter: Secondary | ICD-10-CM

## 2022-11-19 MED ORDER — ENTRESTO 49-51 MG PO TABS: 1.0000 | ORAL_TABLET | Freq: Two times a day (BID) | ORAL | 3 refills | Status: DC

## 2022-11-19 NOTE — Patient Instructions (Addendum)
Medication Instructions:  Your physician has recommended you make the following change in your medication:   START: Entresto 49 - 51 mg twice daily  *If you need a refill on your cardiac medications before your next appointment, please call your pharmacy*   Lab Work: None If you have labs (blood work) drawn today and your tests are completely normal, you will receive your results only by: [place] (if you have MyChart) OR A paper copy in the mail If you have any lab test that is abnormal or we need to change your treatment, we will call you to review the results.   Testing/Procedures: None   Follow-Up: At [facility], you and your health needs are our priority.  As part of our continuing mission to provide you with exceptional heart care, we have created designated Provider Care Teams.  These Care Teams include your primary Cardiologist (physician) and Advanced Practice Providers (APPs -  Physician Assistants and Nurse Practitioners) who all work together to provide you with the care you need, when you need it.  We recommend signing up for the patient portal called "MyChart".  Sign up information is provided on this After Visit Summary.  MyChart is used to connect with patients for Virtual Visits (Telemedicine).  Patients are able to view lab/test results, encounter notes, upcoming appointments, etc.  Non-urgent messages can be sent to your provider as well.   To learn more about what you can do with MyChart, go to [email or web address].    Your next appointment:   6 month(s)  Provider:   Dr. Shirlee More    Other Instructions None

## 2023-01-20 ENCOUNTER — Other Ambulatory Visit: Payer: Self-pay | Admitting: Cardiology

## 2023-01-20 DIAGNOSIS — I5043 Acute on chronic combined systolic (congestive) and diastolic (congestive) heart failure: Secondary | ICD-10-CM

## 2023-01-20 NOTE — Telephone Encounter (Signed)
Refill sent to pharmacy.   

## 2023-02-25 ENCOUNTER — Telehealth: Payer: Self-pay | Admitting: Cardiology

## 2023-02-25 NOTE — Telephone Encounter (Signed)
Pt c/o medication issue:  1. Name of Medication:   sacubitril-valsartan (ENTRESTO) 49-51 MG    2. How are you currently taking this medication (dosage and times per day)? Take 1 tablet by mouth 2 (two) times daily.   3. Are you having a reaction (difficulty breathing--STAT)? No  4. What is your medication issue? Patient was supposed to have the Dosage in changed and sent to the [facility]. Patient is still receiving the original dosage amount instead of the 49-51MG . Patient would like the the correct dosage sent to the correct pharmacy. [facility].

## 2023-02-26 ENCOUNTER — Other Ambulatory Visit: Payer: Self-pay

## 2023-02-26 MED ORDER — ENTRESTO 49-51 MG PO TABS: 1.0000 | ORAL_TABLET | Freq: Two times a day (BID) | ORAL | 3 refills | Status: DC

## 2023-02-26 NOTE — Telephone Encounter (Signed)
Called patient and she reported that when she had picked up her Entresto prescription she was supposed to get the 49 - 51 dose and she got the 97-103 dose from the pharmacy. Looking back at her last office visit she was supposed to be on Entresto 49-51. I sent through a new prescription for Entresto 49-51. Patient stated that she might not get the Entresto refill for a few days, but she had samples of Entresto that she could take until she got the new prescription. Patient was appreciative for the call and had no further questions at this time.

## 2023-03-02 ENCOUNTER — Ambulatory Visit: Payer: PPO | Admitting: Cardiology

## 2023-03-09 ENCOUNTER — Telehealth: Payer: Self-pay | Admitting: Cardiology

## 2023-03-09 MED ORDER — ENTRESTO 49-51 MG PO TABS: 1.0000 | ORAL_TABLET | Freq: Two times a day (BID) | ORAL | 3 refills | Status: DC

## 2023-03-09 NOTE — Telephone Encounter (Signed)
*  STAT* If patient is at the pharmacy, call can be transferred to refill team.   1. Which medications need to be refilled? (please list name of each medication and dose if known)new prescription for  Entresto- patient said the milligram was changed  2. Which pharmacy/location (including street and city if local pharmacy) is medication to be sent to?[facility]  3. Do they need a 30 day or 90 day supply? 90 days and refills

## 2023-03-09 NOTE — Telephone Encounter (Signed)
Rx refill sent to pharmacy. 

## 2023-03-19 ENCOUNTER — Other Ambulatory Visit: Payer: Self-pay | Admitting: Cardiology

## 2023-04-17 ENCOUNTER — Other Ambulatory Visit: Payer: Self-pay | Admitting: Cardiology

## 2023-04-17 DIAGNOSIS — I5043 Acute on chronic combined systolic (congestive) and diastolic (congestive) heart failure: Secondary | ICD-10-CM

## 2023-05-01 ENCOUNTER — Other Ambulatory Visit: Payer: Self-pay | Admitting: Cardiology

## 2023-05-04 ENCOUNTER — Ambulatory Visit: Payer: PPO | Attending: Cardiology | Admitting: Cardiology

## 2023-05-04 ENCOUNTER — Encounter: Payer: Self-pay | Admitting: Cardiology

## 2023-05-04 VITALS — BP 116/80 | HR 68 | Ht 60.0 in | Wt 122.0 lb

## 2023-05-04 DIAGNOSIS — I2510 Atherosclerotic heart disease of native coronary artery without angina pectoris: Secondary | ICD-10-CM | POA: Diagnosis not present

## 2023-05-04 DIAGNOSIS — I480 Paroxysmal atrial fibrillation: Secondary | ICD-10-CM | POA: Diagnosis not present

## 2023-05-04 DIAGNOSIS — D6869 Other thrombophilia: Secondary | ICD-10-CM

## 2023-05-04 DIAGNOSIS — I10 Essential (primary) hypertension: Secondary | ICD-10-CM

## 2023-05-04 NOTE — Progress Notes (Signed)
  Electrophysiology Office Note:   Date:  05/04/2023  ID:  Jennifer Martin, DOB August 08, 1956, MRN [id number]  Primary Cardiologist: [name], MD Electrophysiologist: Regan Lemming, MD      History of Present Illness:   Jennifer Martin is a 67 y.o. female with h/o coronary artery disease, hypertension, hyperlipidemia, atrial fibrillation/flutter seen today for routine electrophysiology followup.  Since last being seen in our clinic the patient reports doing well.  She has no chest pain or shortness of breath.  She has been able to do all of her daily activities without restriction.  She has noted no further episodes of atrial fibrillation or atrial flutter.  She did have an episode where she feels that she was dehydrated where her heart rate got into the 120s but was quite regular.  Aside from that, she has done well.  she denies chest pain, palpitations, dyspnea, PND, orthopnea, nausea, vomiting, dizziness, syncope, edema, weight gain, or early satiety.      She was admitted to [facility] with atrial fibrillation.  She is post ablation for atrial fibrillation and flutter 05/29/2017.      Review of systems complete and found to be negative unless listed in HPI.   EP Information / Studies Reviewed:    EKG is ordered today. Personal review as below.  EKG Interpretation Date/Time:  Monday May 04 2023 10:07:58 EDT Ventricular Rate:  68 PR Interval:    QRS Duration:  142 QT Interval:  472 QTC Calculation: 501 R Axis:   -44  Text Interpretation: Atrial fibrillation Left axis deviation Left bundle branch block When compared with ECG of 30-Jun-2017 11:38, Atrial fibrillation has replaced Sinus rhythm Confirmed by Cesario Weidinger ([id number]) on 05/04/2023 10:21:05 AM     Risk Assessment/Calculations:    CHA2DS2-VASc Score = 5   This indicates a 7.2% annual risk of stroke. The patient's score is based upon: CHF History: 1 HTN History: 1 Diabetes History: 0 Stroke History:  0 Vascular Disease History: 1 Age Score: 1 Gender Score: 1             Physical Exam:   VS:  BP 116/80   Pulse 68   Ht 5' (1.524 m)   Wt 122 lb (55.3 kg)   SpO2 96%   BMI 23.83 kg/m    Wt Readings from Last 3 Encounters:  05/04/23 122 lb (55.3 kg)  11/19/22 122 lb (55.3 kg)  04/28/22 121 lb 6.4 oz (55.1 kg)     GEN: Well nourished, well developed in no acute distress NECK: No JVD; No carotid bruits CARDIAC: Regular rate and rhythm, no murmurs, rubs, gallops RESPIRATORY:  Clear to auscultation without rales, wheezing or rhonchi  ABDOMEN: Soft, non-tender, non-distended EXTREMITIES:  No edema; No deformity   ASSESSMENT AND PLAN:    1.  Atrial fibrillation/flutter: Currently on Xarelto.  Post ablation 05/29/2017.  Has had short episodes of atrial fibrillation but otherwise feels well.  Feels episodes are related to stress.  No changes.  2.  Hypertension: Currently well-controlled  3.  Hyperlipidemia: Continue statin per primary cardiology  4.  Coronary artery disease: No current chest pain.  Continue Plavix per primary cardiology  5.  Secondary hypercoagulable state: Currently on Eliquis for atrial fibrillation  Follow up with Dr. Elberta Fortis in 12 months  Signed, Lekita Kerekes [name], MD

## 2023-05-18 ENCOUNTER — Other Ambulatory Visit: Payer: Self-pay | Admitting: Cardiology

## 2023-05-18 DIAGNOSIS — I480 Paroxysmal atrial fibrillation: Secondary | ICD-10-CM

## 2023-05-18 DIAGNOSIS — I4892 Unspecified atrial flutter: Secondary | ICD-10-CM

## 2023-05-18 DIAGNOSIS — I25118 Atherosclerotic heart disease of native coronary artery with other forms of angina pectoris: Secondary | ICD-10-CM

## 2023-05-18 NOTE — Progress Notes (Unsigned)
Cardiology Office Note:    Date:  05/19/2023   ID:  Jennifer Martin, DOB November 20, 1955, MRN [id number]  PCP:  [name]., MD  Cardiologist:  [name], MD    Referring MD: [name]., MD    ASSESSMENT:    1. Coronary artery disease of native artery of native heart with stable angina pectoris (HCC)   2. Hypertensive heart disease with heart failure (HCC)   3. Paroxysmal atrial fibrillation (HCC)   4. Left bundle branch block (LBBB)   5. Mixed hyperlipidemia   6. Statin myopathy    PLAN:    In order of problems listed above:  [name] is doing well from a cardiology perspective stable CAD no anginal discomfort and continue her medical care including her anticoagulant beta-blocker and lipid-lowering nonstatin Zetia Well compensated she takes a minimum dose of diuretic is on maximally tolerated dose of Entresto previously limited by hypotension Is at EP catheter ablation not having clinical recurrence she will continue beta-blocker anticoagulant   Next appointment: 6 months   Medication Adjustments/Labs and Tests Ordered: Current medicines are reviewed at length with the patient today.  Concerns regarding medicines are outlined above.  No orders of the defined types were placed in this encounter.  No orders of the defined types were placed in this encounter.    History of Present Illness:    Jennifer Martin is a 67 y.o. female with a hx of  complex heart disease including CAD heart failure with normalization of ejection fraction on guideline directed therapy hypertensive heart disease with heart failure paroxysmal atrial fibrillation a flutter status post EP ablation 2018 hyperlipidemia COPD and left bundle branch block and statin intolerance with myopathy last seen 11/19/2022.  Recent labs from 05/11/2023 [facility] health through PCP CMP was normal except for sodium minimally diminished 134 potassium 4.6 creatinine 0.48 GFR greater than 90 cc normal liver function  test hemoglobin A1c 5.7. Cholesterol 201 LDL 106 triglycerides 177 HDL 66 11/10/2022 hemoglobin 13.3 platelets 309,000.  Compliance with diet, lifestyle and medications: Yes  She had recent bronchitis improved after Augmentin and steroids. She is not having angina edema orthopnea shortness of breath palpitation or syncope She had a statin myopathy she takes Zetia and I reviewed her lipid profile I would not place her on another agent at this time Past Medical History:  Diagnosis Date   Acute pulmonary edema (HCC)    Adrenal insufficiency (Addison's disease) (HCC) 04/14/2022   Possible. Saw Endocrine.     Anxiety    Aortic regurgitation 06/17/2017   Mild to moderate    Atherosclerosis of coronary artery of native heart with angina pectoris [facility])    Atrial flutter (HCC)    CAD (coronary artery disease)    Calculus of gallbladder without cholecystitis without obstruction 03/22/2020   CHF (congestive heart failure) (HCC)    Chronic anticoagulation 04/13/2017   Chronic combined systolic and diastolic heart failure (HCC) 04/13/2017   varying EF in the past - 45-50% in 09/2016, 37% by echo 08/2016,  61% by nuc 01/2017 then 30-35% this admission (40% by review from Dr. Mayford Knife)   Chronic obstructive pulmonary disease (HCC) 10/10/2016   COPD (chronic obstructive pulmonary disease) (HCC)    Coronary artery disease involving native coronary artery of native heart with angina pectoris (HCC) 03/25/2017   Overview:  CTO of LCF Lexiscan MPS 07/28/13 with no ischemia, EF 50%   Coronary atherosclerosis of native coronary artery 06/28/2015   CTO of LCF  Lexiscan MPS 07/28/13 with no ischemia, EF 50%   CTO of LCF  Lexiscan MPS 07/28/13 with no ischemia, EF 50%     Degenerative arthritis of cervical spine with nerve compression 01/28/2022   Demand ischemia    Depression    Difficult intubation    " small airway and need a little tube "   Diverticulosis    Diverticulosis of colon 05/08/2020    Fibrocystic breast changes of both breasts 02/04/2016   Last Assessment & Plan:   She follows with Dr. Georgiana Shore for this     GERD (gastroesophageal reflux disease)    Gout 05/12/2016   Last Assessment & Plan:  She needs her uric acid level checked    High risk medication use 05/12/2016   History of atrial fibrillation 06/28/2015   Ablation 2018 follows with EP provider on xarelto  Last Assessment & Plan:  This is stable for her Last Assessment & Plan:  She has not had any episodes of this stable   Hyperlipidemia    Hypertension    Hypertensive heart disease with heart failure (HCC) 03/25/2017   Inappropriate ADH syndrome (HCC)    Inguinal hernia 05/12/2016   Inverted nipple 11/10/2022   Right.  Chronic.     LBBB (left bundle branch block)    Left bundle branch block (LBBB) 06/28/2015   Malaise and fatigue 05/12/2016   Last Assessment & Plan:  Update her TSh for her and her CBC, she is advised to stop smoking    Melasma 11/10/2022   Migraine    "none since ~ 2012; mild ones then when I did have them because of the beta blockers I was on" (06/09/2017)   Mild episode of recurrent major depressive disorder (HCC) 09/27/2016   Mitral regurgitation 06/17/2017   Mild    Mixed hyperlipidemia 06/28/2015   Last Assessment & Plan:    Reviewed with her the last lipids done through cardiology     Mobitz type 1 second degree AV block    Moderate episode of recurrent major depressive disorder (HCC) 09/27/2016   Myocardial infarction (HCC)    "I've had light ones" (06/09/2017)   Near syncope    On amiodarone therapy 04/13/2017   Osteoarthritis    Palpitation 01/13/2018   Paroxysmal atrial fibrillation (HCC) 05/29/2017   Pneumonia    "couple times" (06/09/2017)   Preoperative cardiovascular examination 10/01/2018   Primary osteoarthritis involving multiple joints 05/12/2016   Last Assessment & Plan:   This is stable for her at this time     SIADH (syndrome of inappropriate ADH production) (HCC)  05/12/2016   Last Assessment & Plan:    Her sodium is stable for her     SOB (shortness of breath) on exertion    Statin intolerance 09/22/2019   Statin myopathy 09/27/2019   Tobacco dependence 09/30/2018   V-tach (HCC) 06/28/2015    Current Medications: Current Meds  Medication Sig   acetaminophen (TYLENOL) 500 MG tablet Take 1,000 mg by mouth 2 (two) times daily as needed for mild pain or headache.   albuterol (PROVENTIL HFA;VENTOLIN HFA) 108 (90 Base) MCG/ACT inhaler Inhale 2 puffs into the lungs every 6 (six) hours as needed for wheezing or shortness of breath.   CALCIUM CITRATE-VITAMIN D3 PO Take 1 tablet by mouth daily. Strength 600mg /500 IU   escitalopram (LEXAPRO) 20 MG tablet TAKE ONE TABLET BY MOUTH ONCE DAILY   ezetimibe (ZETIA) 10 MG tablet Take 10 mg by mouth 2 (two) times a week.  fluticasone (FLONASE) 50 MCG/ACT nasal spray Place 2 sprays into both nostrils daily as needed for allergies or rhinitis.   furosemide (LASIX) 20 MG tablet Take 20 mg by mouth daily.   ipratropium-albuterol (DUONEB) 0.5-2.5 (3) MG/3ML SOLN Take 3 mLs by nebulization every 6 (six) hours as needed (wheezing).   LORazepam (ATIVAN) 1 MG tablet Take 1 mg by mouth 3 (three) times daily as needed for anxiety.    MAGNESIUM PO Take 1 capsule by mouth daily.   metoprolol tartrate (LOPRESSOR) 25 MG tablet Take 0.5 tablets (12.5 mg total) by mouth 2 (two) times daily.   nitroGLYCERIN (NITROSTAT) 0.4 MG SL tablet Place 0.4 mg under the tongue every 5 (five) minutes as needed for chest pain.   pantoprazole (PROTONIX) 40 MG tablet Take 40 mg by mouth daily.   potassium chloride (KLOR-CON) 10 MEQ tablet TAKE ONE TABLET BY MOUTH TWICE DAILY   sacubitril-valsartan (ENTRESTO) 49-51 MG Take 1 tablet by mouth 2 (two) times daily.   traMADol (ULTRAM) 50 MG tablet Take 50-100 mg by mouth every 6 (six) hours as needed for moderate pain.      EKGs/Labs/Other Studies Reviewed:    The following studies were reviewed  today:         Recent Labs: No results found for requested labs within last 365 days.  Recent Lipid Panel    Component Value Date/Time   CHOL 211 (H) 06/26/2021 1120   TRIG 120 06/26/2021 1120   HDL 68 06/26/2021 1120   CHOLHDL 3.1 06/26/2021 1120   CHOLHDL 3.2 06/11/2017 1439   VLDL 20 06/11/2017 1439   LDLCALC 122 (H) 06/26/2021 1120    Physical Exam:    VS:  BP 102/60   Pulse 66   Ht 5' (1.524 m)   Wt 122 lb 6.4 oz (55.5 kg)   SpO2 98%   BMI 23.90 kg/m     Wt Readings from Last 3 Encounters:  05/19/23 122 lb 6.4 oz (55.5 kg)  05/04/23 122 lb (55.3 kg)  11/19/22 122 lb (55.3 kg)     GEN:  Well nourished, well developed in no acute distress HEENT: Normal NECK: No JVD; No carotid bruits LYMPHATICS: No lymphadenopathy CARDIAC: RRR, no murmurs, rubs, gallops RESPIRATORY:  Clear to auscultation without rales, wheezing or rhonchi  ABDOMEN: Soft, non-tender, non-distended MUSCULOSKELETAL:  No edema; No deformity  SKIN: Warm and dry NEUROLOGIC:  Alert and oriented x 3 PSYCHIATRIC:  Normal affect    Signed, [name], MD  05/19/2023 11:39 AM    [facility]

## 2023-05-18 NOTE — Telephone Encounter (Signed)
Prescription refill request for Xarelto received.  Indication: PAF Last office visit: 05/04/23  Carleene Mains MD Weight: 55.3kg Age: 67 Scr: 0.48 on 05/11/23  Epic CrCl: 97.93  Based on above findings Xarelto 20mg  daily is the appropriate dose.  Refill approved.

## 2023-05-19 ENCOUNTER — Ambulatory Visit: Payer: PPO | Attending: Cardiology | Admitting: Cardiology

## 2023-05-19 ENCOUNTER — Encounter: Payer: Self-pay | Admitting: Cardiology

## 2023-05-19 ENCOUNTER — Ambulatory Visit: Payer: PPO | Admitting: Cardiology

## 2023-05-19 VITALS — BP 102/60 | HR 66 | Ht 60.0 in | Wt 122.4 lb

## 2023-05-19 DIAGNOSIS — I480 Paroxysmal atrial fibrillation: Secondary | ICD-10-CM | POA: Diagnosis not present

## 2023-05-19 DIAGNOSIS — T466X5D Adverse effect of antihyperlipidemic and antiarteriosclerotic drugs, subsequent encounter: Secondary | ICD-10-CM

## 2023-05-19 DIAGNOSIS — I447 Left bundle-branch block, unspecified: Secondary | ICD-10-CM

## 2023-05-19 DIAGNOSIS — I25118 Atherosclerotic heart disease of native coronary artery with other forms of angina pectoris: Secondary | ICD-10-CM | POA: Diagnosis not present

## 2023-05-19 DIAGNOSIS — G720 Drug-induced myopathy: Secondary | ICD-10-CM

## 2023-05-19 DIAGNOSIS — I110 Hypertensive heart disease with heart failure: Secondary | ICD-10-CM

## 2023-05-19 DIAGNOSIS — E782 Mixed hyperlipidemia: Secondary | ICD-10-CM

## 2023-05-19 NOTE — Patient Instructions (Signed)

## 2023-06-09 DIAGNOSIS — D171 Benign lipomatous neoplasm of skin and subcutaneous tissue of trunk: Secondary | ICD-10-CM

## 2023-06-09 HISTORY — DX: Benign lipomatous neoplasm of skin and subcutaneous tissue of trunk: D17.1

## 2023-06-16 ENCOUNTER — Other Ambulatory Visit: Payer: Self-pay | Admitting: Cardiology

## 2023-07-16 ENCOUNTER — Other Ambulatory Visit: Payer: Self-pay | Admitting: Cardiology

## 2023-07-16 DIAGNOSIS — I5043 Acute on chronic combined systolic (congestive) and diastolic (congestive) heart failure: Secondary | ICD-10-CM

## 2023-07-27 ENCOUNTER — Telehealth: Payer: Self-pay | Admitting: Cardiology

## 2023-07-27 ENCOUNTER — Other Ambulatory Visit: Payer: Self-pay

## 2023-07-27 NOTE — Telephone Encounter (Signed)
Called the patient and she reported that her heart rate was elevated in the range of 105 - 115 bpm. Patient also reported that she had Covid, fever and diarrhea and is currently beginning to feel "good". Spoke to Dr. [name] regarding the patient's symptoms and he recommended that she finish getting over Covid and then if her heart rate is still elevated to call us back at that time. I relayed this recommendation to the patient and she verbalized understanding and had no further questions at this time.

## 2023-07-27 NOTE — Telephone Encounter (Signed)
STAT if HR is under 50 or over 120 (normal HR is 60-100 beats per minute)  What is your heart rate?  105  Do you have a log of your heart rate readings (document readings)?  Has been ranging 105-115 bpm   Do you have any other symptoms?  HR has been elevated + patient has COVID with dizziness, 99.8 fever, diarrhea, and weakness in legs. She states she was given an antiviral (not Paxlovid) at her PCP for symptoms. She states she took a whole Metoprolol instead of 1/2 tablet.

## 2023-08-03 ENCOUNTER — Telehealth: Payer: Self-pay | Admitting: Cardiology

## 2023-08-03 NOTE — Telephone Encounter (Signed)
Called patient and she reported that since she has gotten over Covid her heart rate is still elevated. Her heart rates have been ranging from 105 - 131. Her blood pressures over the past few days have been 108/86 HR 128, 106/84 HR 129 and 116/90 HR 131. Patient states that since she has gotten over Covid her heart rate has not gone down. She also reports being SOB and light headed moving around her house. Please advise.

## 2023-08-03 NOTE — Telephone Encounter (Signed)
STAT if HR is under 50 or over 120 (normal HR is 60-100 beats per minute)  1) What is your heart rate? 120-125  2) Do you have a log of your heart rate readings (document readings)?   3) Do you have any other symptoms? Patient's daughter stated that the patient's heart is still staying around 120/125 after having covid. Patient's daughter stated the patient has been a little sob and felt lightheaded. Patient's daughter is requesting we call the patient's home phone [phone]. Please advise.

## 2023-08-03 NOTE — Telephone Encounter (Signed)
error 

## 2023-08-04 DIAGNOSIS — I10 Essential (primary) hypertension: Secondary | ICD-10-CM | POA: Diagnosis not present

## 2023-08-04 DIAGNOSIS — I4892 Unspecified atrial flutter: Secondary | ICD-10-CM | POA: Diagnosis not present

## 2023-08-04 DIAGNOSIS — I447 Left bundle-branch block, unspecified: Secondary | ICD-10-CM | POA: Diagnosis not present

## 2023-08-04 DIAGNOSIS — I340 Nonrheumatic mitral (valve) insufficiency: Secondary | ICD-10-CM | POA: Diagnosis not present

## 2023-08-04 DIAGNOSIS — I361 Nonrheumatic tricuspid (valve) insufficiency: Secondary | ICD-10-CM | POA: Diagnosis not present

## 2023-08-04 DIAGNOSIS — I509 Heart failure, unspecified: Secondary | ICD-10-CM | POA: Diagnosis not present

## 2023-08-05 DIAGNOSIS — I509 Heart failure, unspecified: Secondary | ICD-10-CM | POA: Diagnosis not present

## 2023-08-05 DIAGNOSIS — I10 Essential (primary) hypertension: Secondary | ICD-10-CM | POA: Diagnosis not present

## 2023-08-05 DIAGNOSIS — I447 Left bundle-branch block, unspecified: Secondary | ICD-10-CM | POA: Diagnosis not present

## 2023-08-05 DIAGNOSIS — I4892 Unspecified atrial flutter: Secondary | ICD-10-CM | POA: Diagnosis not present

## 2023-08-06 DIAGNOSIS — I4892 Unspecified atrial flutter: Secondary | ICD-10-CM | POA: Diagnosis not present

## 2023-08-06 DIAGNOSIS — I4891 Unspecified atrial fibrillation: Secondary | ICD-10-CM

## 2023-08-06 DIAGNOSIS — I2510 Atherosclerotic heart disease of native coronary artery without angina pectoris: Secondary | ICD-10-CM | POA: Diagnosis not present

## 2023-08-06 DIAGNOSIS — I509 Heart failure, unspecified: Secondary | ICD-10-CM | POA: Diagnosis not present

## 2023-08-06 DIAGNOSIS — E785 Hyperlipidemia, unspecified: Secondary | ICD-10-CM

## 2023-08-07 DIAGNOSIS — I340 Nonrheumatic mitral (valve) insufficiency: Secondary | ICD-10-CM

## 2023-08-07 DIAGNOSIS — I361 Nonrheumatic tricuspid (valve) insufficiency: Secondary | ICD-10-CM | POA: Diagnosis not present

## 2023-08-07 DIAGNOSIS — I2510 Atherosclerotic heart disease of native coronary artery without angina pectoris: Secondary | ICD-10-CM | POA: Diagnosis not present

## 2023-08-07 DIAGNOSIS — I447 Left bundle-branch block, unspecified: Secondary | ICD-10-CM | POA: Diagnosis not present

## 2023-08-07 DIAGNOSIS — I4892 Unspecified atrial flutter: Secondary | ICD-10-CM | POA: Diagnosis not present

## 2023-08-07 DIAGNOSIS — I4891 Unspecified atrial fibrillation: Secondary | ICD-10-CM | POA: Diagnosis not present

## 2023-08-07 DIAGNOSIS — I509 Heart failure, unspecified: Secondary | ICD-10-CM | POA: Diagnosis not present

## 2023-08-08 DIAGNOSIS — I2510 Atherosclerotic heart disease of native coronary artery without angina pectoris: Secondary | ICD-10-CM | POA: Diagnosis not present

## 2023-08-08 DIAGNOSIS — I447 Left bundle-branch block, unspecified: Secondary | ICD-10-CM | POA: Diagnosis not present

## 2023-08-08 DIAGNOSIS — I509 Heart failure, unspecified: Secondary | ICD-10-CM | POA: Diagnosis not present

## 2023-08-08 DIAGNOSIS — I4892 Unspecified atrial flutter: Secondary | ICD-10-CM | POA: Diagnosis not present

## 2023-08-08 DIAGNOSIS — I4891 Unspecified atrial fibrillation: Secondary | ICD-10-CM | POA: Diagnosis not present

## 2023-08-12 NOTE — Progress Notes (Unsigned)
Cardiology Office Note:    Date:  08/13/2023   ID:  Jennifer Martin, DOB 1955/11/15, MRN [id number]  PCP:  [name]., MD  Cardiologist:  [name], MD    Referring MD: [name]., MD    ASSESSMENT:    1. Atypical atrial flutter (HCC)   2. Chronic anticoagulation   3. Hypertensive heart disease with heart failure (HCC)   4. Left bundle branch block (LBBB)   5. Hyponatremia    PLAN:    In order of problems listed above:  She had recurrent atrial flutter in the context of COVID-19 infection and severe delusional hyponatremia continue amiodarone at this time I would not refer for repeat ablations unless she has persistent LV dysfunction or clinical recurrence and continue anticoagulant Follow-up 1 month will need to have thyroid studies and CMP performed Continue her current loop diuretic and guideline directed treatment including metoprolol and Entresto Recheck EF after next visit I will recheck her electrolytes today and asked her to not add salt to her diet remove sports drinks and fluid restrict 3 L/day Stable CAD continue current medical treatment including lipid-lowering   Next appointment: 4 weeks   Medication Adjustments/Labs and Tests Ordered: Current medicines are reviewed at length with the patient today.  Concerns regarding medicines are outlined above.  Orders Placed This Encounter  Procedures   EKG 12-Lead   No orders of the defined types were placed in this encounter.    History of Present Illness:    Jennifer Martin is a 67 y.o. female with a hx of complex heart disease including CAD heart failure normalization of ejection fraction on guideline directed therapy hypertensive heart disease with heart failure paroxysmal atrial fibrillation flutter status post EP ablation maintaining sinus rhythm hyperlipidemia COPD and left bundle branch block along with myopathy with statin intolerance last seen 05/19/2023.  Recently seen at [facility] by my  partner Dr. Bing Matter she had atrial flutter atypical with 2-1 conduction and left bundle branch show an echocardiogram that was commented being difficult to interpret perhaps mild left ventricular dysfunction.  Laboratory studies showed severe hyponatremia sodium 23 abnormal chest x-ray felt to be pseudotumor from pleural effusion.  Discharge diagnosis was atrial flutter in the setting of heart failure as well as COPD exacerbation low sodium level TEE guided cardioversion.  Her discharge her serum sodium remained quite diminished at 126 hemoglobin 11.3 proBNP level was modestly elevated 1480 serial troponins were normal.  She was discharged from the hospital on the amiodarone and continued on her anticoagulant  Compliance with diet, lifestyle and medications: Yes  Her atrial fibrillation and flutter occurred in the context of COVID-19 infection and severe hyponatremia She is improved but still feels weak On fortunately was given salt tablets and asked to force sodium in her diet and not water restricted to hospital discharge with her delusional hyponatremia I will check electrolytes today She is not having shortness of breath edema chest pain palpitation or syncope Continue her higher dose amiodarone to 4 weeks then drop to 200 mg/day follow-up in 4 weeks. Past Medical History:  Diagnosis Date   Acute pulmonary edema (HCC)    Adrenal insufficiency (Addison's disease) (HCC) 04/14/2022   Possible. Saw Endocrine.     Anxiety    Aortic regurgitation 06/17/2017   Mild to moderate    Atherosclerosis of coronary artery of native heart with angina pectoris [facility])    Atrial flutter (HCC)    CAD (coronary artery disease)  Calculus of gallbladder without cholecystitis without obstruction 03/22/2020   CHF (congestive heart failure) (HCC)    Chronic anticoagulation 04/13/2017   Chronic combined systolic and diastolic heart failure (HCC) 04/13/2017   varying EF in the past - 45-50% in 09/2016, 37% by  echo 08/2016,  61% by nuc 01/2017 then 30-35% this admission (40% by review from Dr. Mayford Knife)   Chronic obstructive pulmonary disease (HCC) 10/10/2016   COPD (chronic obstructive pulmonary disease) (HCC)    Coronary artery disease involving native coronary artery of native heart with angina pectoris (HCC) 03/25/2017   Overview:  CTO of LCF Lexiscan MPS 07/28/13 with no ischemia, EF 50%   Coronary atherosclerosis of native coronary artery 06/28/2015   CTO of LCF  Lexiscan MPS 07/28/13 with no ischemia, EF 50%   CTO of LCF  Lexiscan MPS 07/28/13 with no ischemia, EF 50%     Degenerative arthritis of cervical spine with nerve compression 01/28/2022   Demand ischemia (HCC)    Depression    Difficult intubation    " small airway and need a little tube "   Diverticulosis    Diverticulosis of colon 05/08/2020   Fibrocystic breast changes of both breasts 02/04/2016   Last Assessment & Plan:   She follows with Dr. Georgiana Shore for this     GERD (gastroesophageal reflux disease)    Gout 05/12/2016   Last Assessment & Plan:  She needs her uric acid level checked    High risk medication use 05/12/2016   History of atrial fibrillation 06/28/2015   Ablation 2018 follows with EP provider on xarelto  Last Assessment & Plan:  This is stable for her Last Assessment & Plan:  She has not had any episodes of this stable   Hyperlipidemia    Hypertension    Hypertensive heart disease with heart failure (HCC) 03/25/2017   Inappropriate ADH syndrome (HCC)    Inguinal hernia 05/12/2016   Inverted nipple 11/10/2022   Right.  Chronic.     LBBB (left bundle branch block)    Left bundle branch block (LBBB) 06/28/2015   Malaise and fatigue 05/12/2016   Last Assessment & Plan:  Update her TSh for her and her CBC, she is advised to stop smoking    Melasma 11/10/2022   Migraine    "none since ~ 2012; mild ones then when I did have them because of the beta blockers I was on" (06/09/2017)   Mild episode of recurrent major  depressive disorder (HCC) 09/27/2016   Mitral regurgitation 06/17/2017   Mild    Mixed hyperlipidemia 06/28/2015   Last Assessment & Plan:    Reviewed with her the last lipids done through cardiology     Mobitz type 1 second degree AV block    Moderate episode of recurrent major depressive disorder (HCC) 09/27/2016   Myocardial infarction (HCC)    "I've had light ones" (06/09/2017)   Near syncope    On amiodarone therapy 04/13/2017   Osteoarthritis    Palpitation 01/13/2018   Paroxysmal atrial fibrillation (HCC) 05/29/2017   Pneumonia    "couple times" (06/09/2017)   Preoperative cardiovascular examination 10/01/2018   Primary osteoarthritis involving multiple joints 05/12/2016   Last Assessment & Plan:   This is stable for her at this time     SIADH (syndrome of inappropriate ADH production) (HCC) 05/12/2016   Last Assessment & Plan:    Her sodium is stable for her     SOB (shortness of breath) on exertion  Statin intolerance 09/22/2019   Statin myopathy 09/27/2019   Tobacco dependence 09/30/2018   V-tach (HCC) 06/28/2015    Current Medications: Current Meds  Medication Sig   acetaminophen (TYLENOL) 500 MG tablet Take 1,000 mg by mouth 2 (two) times daily as needed for mild pain or headache.   albuterol (PROVENTIL HFA;VENTOLIN HFA) 108 (90 Base) MCG/ACT inhaler Inhale 2 puffs into the lungs every 6 (six) hours as needed for wheezing or shortness of breath.   amiodarone (PACERONE) 200 MG tablet Take 200 mg by mouth 2 (two) times daily.   CALCIUM CITRATE-VITAMIN D3 PO Take 1 tablet by mouth daily. Strength 600mg /500 IU   ENTRESTO 24-26 MG Take 1 tablet by mouth 2 (two) times daily.   escitalopram (LEXAPRO) 10 MG tablet Take 10 mg by mouth daily.   ezetimibe (ZETIA) 10 MG tablet Take 10 mg by mouth 2 (two) times a week.   fluticasone (FLONASE) 50 MCG/ACT nasal spray Place 2 sprays into both nostrils daily as needed for allergies or rhinitis.   furosemide (LASIX) 20 MG tablet  Take 20 mg by mouth daily.   ipratropium-albuterol (DUONEB) 0.5-2.5 (3) MG/3ML SOLN Take 3 mLs by nebulization every 6 (six) hours as needed (wheezing).   LORazepam (ATIVAN) 1 MG tablet Take 1 mg by mouth 3 (three) times daily as needed for anxiety.    MAGNESIUM PO Take 1 capsule by mouth daily.   metoprolol succinate (TOPROL-XL) 25 MG 24 hr tablet Take 25 mg by mouth daily.   metoprolol tartrate (LOPRESSOR) 25 MG tablet Take 0.5 tablets (12.5 mg total) by mouth 2 (two) times daily.   nitroGLYCERIN (NITROSTAT) 0.4 MG SL tablet Place 0.4 mg under the tongue every 5 (five) minutes as needed for chest pain.   pantoprazole (PROTONIX) 40 MG tablet Take 40 mg by mouth daily.   potassium chloride (KLOR-CON) 10 MEQ tablet TAKE ONE TABLET BY MOUTH TWICE DAILY   traMADol (ULTRAM) 50 MG tablet Take 50-100 mg by mouth every 6 (six) hours as needed for moderate pain.   XARELTO 20 MG TABS tablet TAKE ONE TABLET BY MOUTH ONCE DAILY      EKGs/Labs/Other Studies Reviewed:    The following studies were reviewed today:  Cardiac Studies & Procedures     STRESS TESTS  MYOCARDIAL PERFUSION IMAGING 01/26/2017   ECHOCARDIOGRAM  ECHOCARDIOGRAM COMPLETE 06/04/2020  Narrative ECHOCARDIOGRAM REPORT    Patient Name:   Jennifer Martin Date of Exam: 06/04/2020 Medical Rec #:  [id number]     Height:       60.0 in Accession #:    [id number]    Weight:       134.8 lb Date of Birth:  1956/08/28     BSA:          1.578 m Patient Age:    64 years      BP:           126/62 mmHg Patient Gender: F             HR:           70 bpm. Exam Location:  [place] [facility]  Procedure: 2D Echo  Indications:    Chronic combined systolic and diastolic heart failure (HCC) [I50.42]  History:        Patient has prior history of Echocardiogram examinations, most recent 12/15/2017. Arrythmias:Atrial Fibrillation, Signs/Symptoms:Shortness of Breath; Risk Factors:Tobacco dependence.  Sonographer:    Louie Boston Referring Phys: [phone] Olman Yono  J Asaf Elmquist  IMPRESSIONS   1. Left  ventricular ejection fraction, by estimation, is 50 to 55%. The left ventricle has low normal function. The left ventricle has no regional wall motion abnormalities. There is mild left ventricular hypertrophy. Left ventricular diastolic parameters are consistent with Grade II diastolic dysfunction (pseudonormalization). 2. Right ventricular systolic function is normal. The right ventricular size is normal. There is normal pulmonary artery systolic pressure. 3. Left atrial size was mildly dilated. 4. The mitral valve is normal in structure. Mild mitral valve regurgitation. No evidence of mitral stenosis. 5. The aortic valve is normal in structure. Aortic valve regurgitation is mild to moderate. No aortic stenosis is present. 6. The inferior vena cava is normal in size with greater than 50% respiratory variability, suggesting right atrial pressure of 3 mmHg.  FINDINGS Left Ventricle: Left ventricular ejection fraction, by estimation, is 50 to 55%. The left ventricle has low normal function. The left ventricle has no regional wall motion abnormalities. The left ventricular internal cavity size was normal in size. There is mild left ventricular hypertrophy. Abnormal (paradoxical) septal motion, consistent with left bundle branch block. Left ventricular diastolic parameters are consistent with Grade II diastolic dysfunction (pseudonormalization).  Right Ventricle: The right ventricular size is normal. No increase in right ventricular wall thickness. Right ventricular systolic function is normal. There is normal pulmonary artery systolic pressure. The tricuspid regurgitant velocity is 2.69 m/s, and with an assumed right atrial pressure of 3 mmHg, the estimated right ventricular systolic pressure is 31.9 mmHg.  Left Atrium: Left atrial size was mildly dilated.  Right Atrium: Right atrial size was normal in size.  Pericardium: There is no evidence of pericardial  effusion.  Mitral Valve: The mitral valve is normal in structure. Normal mobility of the mitral valve leaflets. Mild mitral valve regurgitation. No evidence of mitral valve stenosis.  Tricuspid Valve: The tricuspid valve is normal in structure. Tricuspid valve regurgitation is mild . No evidence of tricuspid stenosis.  Aortic Valve: The aortic valve is normal in structure. Aortic valve regurgitation is mild to moderate. Aortic regurgitation PHT measures 367 msec. No aortic stenosis is present.  Pulmonic Valve: The pulmonic valve was normal in structure. Pulmonic valve regurgitation is not visualized. No evidence of pulmonic stenosis.  Aorta: The aortic root is normal in size and structure.  Venous: The inferior vena cava is normal in size with greater than 50% respiratory variability, suggesting right atrial pressure of 3 mmHg.  IAS/Shunts: No atrial level shunt detected by color flow Doppler.   LEFT VENTRICLE PLAX 2D LVIDd:         4.70 cm     Diastology LV PW:         1.20 cm     LV e' lateral:   6.53 cm/s LV IVS:        1.30 cm     LV E/e' lateral: 13.8 LVOT diam:     2.30 cm     LV e' medial:    4.28 cm/s LV SV:         68          LV E/e' medial:  21.1 LV SV Index:   43 LVOT Area:     4.15 cm  LV Volumes (MOD) LV vol d, MOD A2C: 63.9 ml LV vol d, MOD A4C: 64.5 ml LV vol s, MOD A2C: 34.9 ml LV vol s, MOD A4C: 36.1 ml LV SV MOD A2C:     29.0 ml LV SV MOD A4C:     64.5 ml LV SV  MOD BP:      29.7 ml  RIGHT VENTRICLE             IVC RV S prime:     10.70 cm/s  IVC diam: 1.50 cm TAPSE (M-mode): 2.6 cm  LEFT ATRIUM             Index       RIGHT ATRIUM           Index LA Vol (A2C):   53.8 ml 34.08 ml/m RA Area:     13.40 cm LA Vol (A4C):   59.7 ml 37.82 ml/m RA Volume:   34.50 ml  21.86 ml/m LA Biplane Vol: 57.6 ml 36.49 ml/m AORTIC VALVE LVOT Vmax:   84.20 cm/s LVOT Vmean:  53.000 cm/s LVOT VTI:    0.164 m AI PHT:      367 msec  AORTA Ao Root diam: 3.50 cm Ao  Asc diam:  3.30 cm  MITRAL VALVE               TRICUSPID VALVE MV Area (PHT): 4.49 cm    TR Peak grad:   28.9 mmHg MV Decel Time: 169 msec    TR Vmax:        269.00 cm/s MV E velocity: 90.20 cm/s MV A velocity: 67.40 cm/s  SHUNTS MV E/A ratio:  1.34        Systemic VTI:  0.16 m Systemic Diam: 2.30 cm  Gypsy Balsam MD Electronically signed by Gypsy Balsam MD Signature Date/Time: 06/04/2020/4:51:44 PM    Final             EKG Interpretation Date/Time:  Thursday August 13 2023 15:51:47 EDT Ventricular Rate:  68 PR Interval:  230 QRS Duration:  134 QT Interval:  476 QTC Calculation: 506 R Axis:   -47  Text Interpretation: Sinus rhythm with 1st degree A-V block Left axis deviation Left bundle branch block When compared with ECG of 04-May-2023 10:07, Sinus rhythm has replaced Atrial fibrillation Confirmed by [name] ([id number]) on 08/13/2023 3:56:29 PM   Recent Labs: No results found for requested labs within last 365 days.  Recent Lipid Panel    Component Value Date/Time   CHOL 211 (H) 06/26/2021 1120   TRIG 120 06/26/2021 1120   HDL 68 06/26/2021 1120   CHOLHDL 3.1 06/26/2021 1120   CHOLHDL 3.2 06/11/2017 1439   VLDL 20 06/11/2017 1439   LDLCALC 122 (H) 06/26/2021 1120    Physical Exam:    VS:  BP 120/68   Pulse 68   Ht 5' (1.524 m)   Wt 120 lb 6.4 oz (54.6 kg)   SpO2 96%   BMI 23.51 kg/m     Wt Readings from Last 3 Encounters:  08/13/23 120 lb 6.4 oz (54.6 kg)  05/19/23 122 lb 6.4 oz (55.5 kg)  05/04/23 122 lb (55.3 kg)     GEN:  Well nourished, well developed in no acute distress HEENT: Normal NECK: No JVD; No carotid bruits LYMPHATICS: No lymphadenopathy CARDIAC: RRR, no murmurs, rubs, gallops RESPIRATORY:  Clear to auscultation without rales, wheezing or rhonchi  ABDOMEN: Soft, non-tender, non-distended MUSCULOSKELETAL:  No edema; No deformity  SKIN: Warm and dry NEUROLOGIC:  Alert and oriented x 3 PSYCHIATRIC:  Normal affect     Signed, [name], MD  08/13/2023 4:16 PM    [facility]

## 2023-08-13 ENCOUNTER — Encounter: Payer: Self-pay | Admitting: Cardiology

## 2023-08-13 ENCOUNTER — Ambulatory Visit: Payer: PPO | Attending: Cardiology | Admitting: Cardiology

## 2023-08-13 VITALS — BP 120/68 | HR 68 | Ht 60.0 in | Wt 120.4 lb

## 2023-08-13 DIAGNOSIS — I110 Hypertensive heart disease with heart failure: Secondary | ICD-10-CM | POA: Diagnosis not present

## 2023-08-13 DIAGNOSIS — E871 Hypo-osmolality and hyponatremia: Secondary | ICD-10-CM

## 2023-08-13 DIAGNOSIS — I447 Left bundle-branch block, unspecified: Secondary | ICD-10-CM

## 2023-08-13 DIAGNOSIS — Z7901 Long term (current) use of anticoagulants: Secondary | ICD-10-CM

## 2023-08-13 DIAGNOSIS — I484 Atypical atrial flutter: Secondary | ICD-10-CM

## 2023-08-13 MED ORDER — AMIODARONE HCL 400 MG PO TABS: 400.0000 mg | ORAL_TABLET | Freq: Every day | ORAL | 3 refills | Status: DC

## 2023-08-13 NOTE — Patient Instructions (Signed)
Medication Instructions:  Your physician has recommended you make the following change in your medication:   START: Amiodarone 400 mg daily (On 08/24/23, decrease to 200 mg daily)  *If you need a refill on your cardiac medications before your next appointment, please call your pharmacy*   Lab Work: Your physician recommends that you return for lab work in:   Labs today: BMP  If you have labs (blood work) drawn today and your tests are completely normal, you will receive your results only by: MyChart Message (if you have MyChart) OR A paper copy in the mail If you have any lab test that is abnormal or we need to change your treatment, we will call you to review the results.   Testing/Procedures: None   Follow-Up: At [facility], you and your health needs are our priority.  As part of our continuing mission to provide you with exceptional heart care, we have created designated Provider Care Teams.  These Care Teams include your primary Cardiologist (physician) and Advanced Practice Providers (APPs -  Physician Assistants and Nurse Practitioners) who all work together to provide you with the care you need, when you need it.  We recommend signing up for the patient portal called "MyChart".  Sign up information is provided on this After Visit Summary.  MyChart is used to connect with patients for Virtual Visits (Telemedicine).  Patients are able to view lab/test results, encounter notes, upcoming appointments, etc.  Non-urgent messages can be sent to your provider as well.   To learn more about what you can do with MyChart, go to [email or web address].    Your next appointment:   4 week(s)  Provider:   [name], MD    Other Instructions Purchase a mobile kardia and record daily.  Fluid restriction to 3 liters per day.

## 2023-08-14 LAB — BASIC METABOLIC PANEL
BUN/Creatinine Ratio: 15 (ref 12–28)
BUN: 9 mg/dL (ref 8–27)
CO2: 26 mmol/L (ref 20–29)
Calcium: 9.4 mg/dL (ref 8.7–10.3)
Chloride: 89 mmol/L — ABNORMAL LOW (ref 96–106)
Creatinine, Ser: 0.59 mg/dL (ref 0.57–1.00)
Glucose: 87 mg/dL (ref 70–99)
Potassium: 4.6 mmol/L (ref 3.5–5.2)
Sodium: 130 mmol/L — ABNORMAL LOW (ref 134–144)
eGFR: 98 mL/min/{1.73_m2} (ref >59)

## 2023-08-18 ENCOUNTER — Encounter: Payer: Self-pay | Admitting: Internal Medicine

## 2023-08-18 ENCOUNTER — Other Ambulatory Visit: Payer: Self-pay | Admitting: Cardiology

## 2023-08-21 ENCOUNTER — Encounter: Payer: Self-pay | Admitting: Internal Medicine

## 2023-08-28 ENCOUNTER — Telehealth: Payer: Self-pay | Admitting: Cardiology

## 2023-08-28 NOTE — Telephone Encounter (Signed)
Patient c/o Palpitations:  STAT if patient reporting lightheadedness, shortness of breath, or chest pain  How long have you had palpitations/irregular HR/ Afib? Are you having the symptoms now?   No  Are you currently experiencing lightheadedness, SOB or CP?   Yesterday evening patient stated she was a little dizzy a few time  Do you have a history of afib (atrial fibrillation) or irregular heart rhythm?   Yes  Have you checked your BP or HR? (document readings if available):   HR 66 (while on phone)  O2 - 98  Are you experiencing any other symptoms?     Patient called to report last night her Jennifer Martin mobil device recorded she was in afib.  Patient stated she has not felt dizzy this morning and she has taken her medication.

## 2023-08-31 ENCOUNTER — Other Ambulatory Visit: Payer: Self-pay

## 2023-08-31 NOTE — Telephone Encounter (Signed)
Called patient and informed her of Dr. Hulen Shouts response below:  "I am discouraged she can send them through MyChart you simply exported on your phone is an email attachment and send it as an MyChart message.  Regardless she will need to come to the office to have an EKG performed."  Patient verbalized understanding and had no further questions at this time.

## 2023-08-31 NOTE — Telephone Encounter (Signed)
Called patient and she reports that she has been feeling dizzy and a little SOB. She checked her mobile [place] and it said that she was in A-fib with a HR 66. She does have a history of A-fib and she is currently taking Amiodarone. She has no lower extremity edema, patient is also on Lasix. Patient is unable to send the mobile kardia strips to MyChart. Please advise.

## 2023-09-01 ENCOUNTER — Ambulatory Visit: Payer: PPO

## 2023-09-01 VITALS — BP 140/82 | HR 62 | Resp 18 | Ht 60.0 in | Wt 121.2 lb

## 2023-09-01 DIAGNOSIS — I4819 Other persistent atrial fibrillation: Secondary | ICD-10-CM

## 2023-09-01 NOTE — Progress Notes (Signed)
   Nurse Visit   Date of Encounter: 09/01/2023 ID: Jennifer Martin, DOB 1956/06/17, MRN [id number]  PCP:  [name]., MD   [facility] Providers Cardiologist:  [name], MD Electrophysiologist:  Regan Lemming, MD {   Visit Details   VS:  There were no vitals taken for this visit. , BMI There is no height or weight on file to calculate BMI.  Wt Readings from Last 3 Encounters:  08/13/23 120 lb 6.4 oz (54.6 kg)  05/19/23 122 lb 6.4 oz (55.5 kg)  05/04/23 122 lb (55.3 kg)     Reason for visit: EKG Performed today: Vitals, EKG, Provider consulted:Munley, and Education Changes (medications, testing, etc.) : none Length of Visit: 15 minutes    Medications Adjustments/Labs and Tests Ordered: Orders Placed This Encounter  Procedures   EKG 12-Lead   No orders of the defined types were placed in this encounter.    Signed, Eleonore Chiquito, RN  09/01/2023 10:53 AM

## 2023-09-02 ENCOUNTER — Other Ambulatory Visit: Payer: Self-pay | Admitting: Cardiology

## 2023-09-07 ENCOUNTER — Other Ambulatory Visit: Payer: Self-pay | Admitting: Cardiology

## 2023-09-09 ENCOUNTER — Other Ambulatory Visit: Payer: Self-pay

## 2023-09-09 ENCOUNTER — Telehealth: Payer: Self-pay | Admitting: Cardiology

## 2023-09-09 MED ORDER — ENTRESTO 24-26 MG PO TABS: 1.0000 | ORAL_TABLET | Freq: Two times a day (BID) | ORAL | 3 refills | Status: DC

## 2023-09-09 NOTE — Telephone Encounter (Signed)
Called patient and informed her that her Entresto medication had been re-filled. Patient verbalized understanding and had no further questions at this time.

## 2023-09-09 NOTE — Telephone Encounter (Signed)
Pt c/o medication issue:  1. Name of Medication:   sacubitril-valsartan (ENTRESTO) 24-26 MG   2. How are you currently taking this medication (dosage and times per day)?   As prescribed  3. Are you having a reaction (difficulty breathing--STAT)?   4. What is your medication issue?   Patient stated she will be out of this medication within 4 days.   Patient stated she requested a refill of this medication and the prescription should have gone to [facility] [place], [place] - [address] but was sent to [facility] in error.

## 2023-09-12 NOTE — Progress Notes (Unsigned)
Cardiology Office Note:    Date:  09/14/2023   ID:  Jennifer Martin, DOB [birth year], MRN [id number]  PCP:  [name]., MD  Cardiologist:  [name], MD    Referring MD: [name]., MD    ASSESSMENT:    1. Atypical atrial flutter (HCC)   2. Chronic anticoagulation   3. Hypertensive heart disease with heart failure (HCC)   4. Hyponatremia    PLAN:    In order of problems listed above:  Unfortunately recurrence on amiodarone and would benefit from ablation short-term increase amiodarone and recheck EKG in 2 weeks and continue her beta-blocker She will continue her current anticoagulant no bleeding complication Continue guideline directed therapy including diuretic Entresto beta-blocker   Next appointment: 3 months   Medication Adjustments/Labs and Tests Ordered: Current medicines are reviewed at length with the patient today.  Concerns regarding medicines are outlined above.  Orders Placed This Encounter  Procedures   EKG 12-Lead   No orders of the defined types were placed in this encounter.    History of Present Illness:    Jennifer Martin is a 67 y.o. female with a hx of complex heart disease including CAD heart failure and cardiomyopathy with normalization of ejection fraction with guideline directed therapy hypertensive heart disease paroxysmal atrial flutter and fibrillation status post EP ablation hyperlipidemia left bundle branch block COPD and hyperlipidemia with statin intolerance and statin myopathy.  Last seen 07/14/2023 with recurrent atrial flutter in the setting of COPD exacerbation heart failure and hyponatremia.  Compliance with diet, lifestyle and medications: Yes  Abrupt change yesterday went back into atrial flutter it is relatively slow at a rate of 100 -110 BPM She is also quite emotional dose of Lexapro was decreased in the hospital with hyponatremia I told her she can go back to her usual Long-term I think she requires no EP catheter  ablation with previous cardiomyopathy with tachycardia with atrial fibrillation Short-term we will increase her amiodarone nurse visit 2 weeks for recheck her EKG Not having edema shortness of breath chest pain or syncope Also ask her to have another echocardiogram done normal Past Medical History:  Diagnosis Date   Acute pulmonary edema (HCC)    Adrenal insufficiency (Addison's disease) (HCC) 04/14/2022   Possible. Saw Endocrine.     Anxiety    Aortic regurgitation 06/17/2017   Mild to moderate    Atherosclerosis of coronary artery of native heart with angina pectoris [facility])    Atrial flutter (HCC)    CAD (coronary artery disease)    Calculus of gallbladder without cholecystitis without obstruction 03/22/2020   CHF (congestive heart failure) (HCC)    Chronic anticoagulation 04/13/2017   Chronic combined systolic and diastolic heart failure (HCC) 04/13/2017   varying EF in the past - 45-50% in 09/2016, 37% by echo 08/2016,  61% by nuc 01/2017 then 30-35% this admission (40% by review from Dr. Mayford Knife)   Chronic obstructive pulmonary disease (HCC) 10/10/2016   COPD (chronic obstructive pulmonary disease) (HCC)    Coronary artery disease involving native coronary artery of native heart with angina pectoris (HCC) 03/25/2017   Overview:  CTO of LCF Lexiscan MPS 07/28/13 with no ischemia, EF 50%   Coronary atherosclerosis of native coronary artery 06/28/2015   CTO of LCF  Lexiscan MPS 07/28/13 with no ischemia, EF 50%   CTO of LCF  Lexiscan MPS 07/28/13 with no ischemia, EF 50%     Degenerative arthritis of cervical spine with nerve compression  01/28/2022   Demand ischemia (HCC)    Depression    Difficult intubation    " small airway and need a little tube "   Diverticulosis    Diverticulosis of colon 05/08/2020   Fibrocystic breast changes of both breasts 02/04/2016   Last Assessment & Plan:   She follows with Dr. Georgiana Shore for this     GERD (gastroesophageal reflux disease)    Gout  05/12/2016   Last Assessment & Plan:  She needs her uric acid level checked    High risk medication use 05/12/2016   History of atrial fibrillation 06/28/2015   Ablation 2018 follows with EP provider on xarelto  Last Assessment & Plan:  This is stable for her Last Assessment & Plan:  She has not had any episodes of this stable   Hyperlipidemia    Hypertension    Hypertensive heart disease with heart failure (HCC) 03/25/2017   Inappropriate ADH syndrome (HCC)    Inguinal hernia 05/12/2016   Inverted nipple 11/10/2022   Right.  Chronic.     LBBB (left bundle branch block)    Left bundle branch block (LBBB) 06/28/2015   Malaise and fatigue 05/12/2016   Last Assessment & Plan:  Update her TSh for her and her CBC, she is advised to stop smoking    Melasma 11/10/2022   Migraine    "none since ~ 2012; mild ones then when I did have them because of the beta blockers I was on" (06/09/2017)   Mild episode of recurrent major depressive disorder (HCC) 09/27/2016   Mitral regurgitation 06/17/2017   Mild    Mixed hyperlipidemia 06/28/2015   Last Assessment & Plan:    Reviewed with her the last lipids done through cardiology     Mobitz type 1 second degree AV block    Moderate episode of recurrent major depressive disorder (HCC) 09/27/2016   Myocardial infarction (HCC)    "I've had light ones" (06/09/2017)   Near syncope    On amiodarone therapy 04/13/2017   Osteoarthritis    Palpitation 01/13/2018   Paroxysmal atrial fibrillation (HCC) 05/29/2017   Pneumonia    "couple times" (06/09/2017)   Preoperative cardiovascular examination 10/01/2018   Primary osteoarthritis involving multiple joints 05/12/2016   Last Assessment & Plan:   This is stable for her at this time     SIADH (syndrome of inappropriate ADH production) (HCC) 05/12/2016   Last Assessment & Plan:    Her sodium is stable for her     SOB (shortness of breath) on exertion    Statin intolerance 09/22/2019   Statin myopathy  09/27/2019   Tobacco dependence 09/30/2018   V-tach (HCC) 06/28/2015    Current Medications: Current Meds  Medication Sig   acetaminophen (TYLENOL) 500 MG tablet Take 1,000 mg by mouth 2 (two) times daily as needed for mild pain or headache.   albuterol (PROVENTIL HFA;VENTOLIN HFA) 108 (90 Base) MCG/ACT inhaler Inhale 2 puffs into the lungs every 6 (six) hours as needed for wheezing or shortness of breath.   amiodarone (PACERONE) 200 MG tablet Take 200 mg by mouth daily.   CALCIUM CITRATE-VITAMIN D3 PO Take 1 tablet by mouth daily. Strength 600mg /500 IU   escitalopram (LEXAPRO) 10 MG tablet Take 10 mg by mouth daily.   ezetimibe (ZETIA) 10 MG tablet Take 10 mg by mouth 2 (two) times a week.   fluticasone (FLONASE) 50 MCG/ACT nasal spray Place 2 sprays into both nostrils daily as needed for allergies or rhinitis.  furosemide (LASIX) 20 MG tablet Take 20 mg by mouth daily.   ipratropium-albuterol (DUONEB) 0.5-2.5 (3) MG/3ML SOLN Take 3 mLs by nebulization every 6 (six) hours as needed (wheezing).   LORazepam (ATIVAN) 1 MG tablet Take 1 mg by mouth 3 (three) times daily as needed for anxiety.    MAGNESIUM PO Take 1 capsule by mouth daily.   metoprolol succinate (TOPROL-XL) 25 MG 24 hr tablet Take 1 tablet (25 mg total) by mouth daily.   nitroGLYCERIN (NITROSTAT) 0.4 MG SL tablet Place 0.4 mg under the tongue every 5 (five) minutes as needed for chest pain.   pantoprazole (PROTONIX) 40 MG tablet Take 40 mg by mouth daily.   potassium chloride (KLOR-CON) 10 MEQ tablet TAKE ONE TABLET BY MOUTH TWICE DAILY   sacubitril-valsartan (ENTRESTO) 24-26 MG Take 1 tablet by mouth 2 (two) times daily.   traMADol (ULTRAM) 50 MG tablet Take 50-100 mg by mouth every 6 (six) hours as needed for moderate pain.   XARELTO 20 MG TABS tablet TAKE ONE TABLET BY MOUTH ONCE DAILY (Patient taking differently: Take 20 mg by mouth daily with supper.)   [DISCONTINUED] amiodarone (PACERONE) 400 MG tablet Take 1 tablet (400  mg total) by mouth daily. Decrease to 200 mg on 08/24/23   [DISCONTINUED] metoprolol tartrate (LOPRESSOR) 25 MG tablet Take 0.5 tablets (12.5 mg total) by mouth 2 (two) times daily.      EKGs/Labs/Other Studies Reviewed:    The following studies were reviewed today:     EKG Interpretation Date/Time:  Monday September 14 2023 13:46:49 EST Ventricular Rate:  100 PR Interval:    QRS Duration:  136 QT Interval:  398 QTC Calculation: 513 R Axis:   -65  Text Interpretation: There are clear flutter waves present predominantly 2-1 conduction but variable and left bundle branch block Left axis deviation Left bundle branch block When compared with ECG of 01-Sep-2023 10:45, Atrial flutter has replaced Sinus rhythm Vent. rate has increased BY  38 BPM Confirmed by [name] ([id number]) on 09/14/2023 1:50:05 PM   Recent Labs: 08/13/2023: BUN 9; Creatinine, Ser 0.59; Potassium 4.6; Sodium 130  Recent Lipid Panel    Component Value Date/Time   CHOL 211 (H) 06/26/2021 1120   TRIG 120 06/26/2021 1120   HDL 68 06/26/2021 1120   CHOLHDL 3.1 06/26/2021 1120   CHOLHDL 3.2 06/11/2017 1439   VLDL 20 06/11/2017 1439   LDLCALC 122 (H) 06/26/2021 1120    Physical Exam:    VS:  BP 110/68   Pulse (!) 105   Ht 5' (1.524 m)   Wt 122 lb 3.2 oz (55.4 kg)   SpO2 97%   BMI 23.87 kg/m     Wt Readings from Last 3 Encounters:  09/14/23 122 lb 3.2 oz (55.4 kg)  09/01/23 121 lb 3.2 oz (55 kg)  08/13/23 120 lb 6.4 oz (54.6 kg)     GEN:  Well nourished, well developed in no acute distress HEENT: Normal NECK: No JVD; No carotid bruits LYMPHATICS: No lymphadenopathy CARDIAC: RRR, no murmurs, rubs, gallops RESPIRATORY:  Clear to auscultation without rales, wheezing or rhonchi  ABDOMEN: Soft, non-tender, non-distended MUSCULOSKELETAL:  No edema; No deformity  SKIN: Warm and dry NEUROLOGIC:  Alert and oriented x 3 PSYCHIATRIC:  Normal affect    Signed, [name], MD  09/14/2023 2:05 PM    [facility]

## 2023-09-14 ENCOUNTER — Ambulatory Visit: Payer: PPO | Attending: Cardiology | Admitting: Cardiology

## 2023-09-14 VITALS — BP 110/68 | HR 105 | Ht 60.0 in | Wt 122.2 lb

## 2023-09-14 DIAGNOSIS — Z7901 Long term (current) use of anticoagulants: Secondary | ICD-10-CM

## 2023-09-14 DIAGNOSIS — I110 Hypertensive heart disease with heart failure: Secondary | ICD-10-CM

## 2023-09-14 DIAGNOSIS — I484 Atypical atrial flutter: Secondary | ICD-10-CM | POA: Diagnosis not present

## 2023-09-14 DIAGNOSIS — E871 Hypo-osmolality and hyponatremia: Secondary | ICD-10-CM | POA: Diagnosis not present

## 2023-09-14 MED ORDER — EZETIMIBE 10 MG PO TABS: 10.0000 mg | ORAL_TABLET | ORAL | 3 refills | Status: DC

## 2023-09-14 MED ORDER — AMIODARONE HCL 200 MG PO TABS: 200.0000 mg | ORAL_TABLET | Freq: Two times a day (BID) | ORAL | 3 refills | Status: DC

## 2023-09-14 MED ORDER — ESCITALOPRAM OXALATE 20 MG PO TABS: 20.0000 mg | ORAL_TABLET | Freq: Every day | ORAL | 3 refills | Status: AC

## 2023-09-14 NOTE — Patient Instructions (Signed)
Medication Instructions:  Your physician has recommended you make the following change in your medication:   START: Amiodarone 200 mg two times daily START: Lexapro 20 mg daily   *If you need a refill on your cardiac medications before your next appointment, please call your pharmacy*   Lab Work: None If you have labs (blood work) drawn today and your tests are completely normal, you will receive your results only by: MyChart Message (if you have MyChart) OR A paper copy in the mail If you have any lab test that is abnormal or we need to change your treatment, we will call you to review the results.   Testing/Procedures: Your physician has requested that you have an echocardiogram. Echocardiography is a painless test that uses sound waves to create images of your heart. It provides your doctor with information about the size and shape of your heart and how well your heart's chambers and valves are working. This procedure takes approximately one hour. There are no restrictions for this procedure. Please do NOT wear cologne, perfume, aftershave, or lotions (deodorant is allowed). Please arrive 15 minutes prior to your appointment time.  Please note: We ask at that you not bring children with you during ultrasound (echo/ vascular) testing. Due to room size and safety concerns, children are not allowed in the ultrasound rooms during exams. Our front office staff cannot provide observation of children in our lobby area while testing is being conducted. An adult accompanying a patient to their appointment will only be allowed in the ultrasound room at the discretion of the ultrasound technician under special circumstances. We apologize for any inconvenience.    Follow-Up: At [facility], you and your health needs are our priority.  As part of our continuing mission to provide you with exceptional heart care, we have created designated Provider Care Teams.  These Care Teams include  your primary Cardiologist (physician) and Advanced Practice Providers (APPs -  Physician Assistants and Nurse Practitioners) who all work together to provide you with the care you need, when you need it.  We recommend signing up for the patient portal called "MyChart".  Sign up information is provided on this After Visit Summary.  MyChart is used to connect with patients for Virtual Visits (Telemedicine).  Patients are able to view lab/test results, encounter notes, upcoming appointments, etc.  Non-urgent messages can be sent to your provider as well.   To learn more about what you can do with MyChart, go to [email or web address].    Your next appointment:   3 month(s)  Provider:   [name], MD    Other Instructions Nurse visit in 2 weeks for an EKG

## 2023-09-16 ENCOUNTER — Telehealth: Payer: Self-pay | Admitting: Cardiology

## 2023-09-16 NOTE — Telephone Encounter (Signed)
  Pt c/o medication issue:  1. Name of Medication:   amiodarone (PACERONE) 200 MG tablet    2. How are you currently taking this medication (dosage and times per day)? As written   3. Are you having a reaction (difficulty breathing--STAT)? No   4. What is your medication issue? Pt said, even when Dr. [name] increased her dose, her HR still around 105-112 bpm even when she is not doing anything. She said, she get SOB at times but not a lot

## 2023-09-21 NOTE — Telephone Encounter (Signed)
Left message for the patient to call back.

## 2023-09-21 NOTE — Telephone Encounter (Signed)
Called patient and she reported that her heart rate was still elevated in the 90's - 110's while she was resting. Her Amiodarone was changed on 11/25 at her last office visit. She also mentioned that she has SOB at times. Patient stated she has an appointment scheduled to see Dr. Elberta Fortis and she will be going to the A-fib clinic tomorrow. Please advise.

## 2023-09-21 NOTE — Telephone Encounter (Signed)
Patient is returning call.  °

## 2023-09-22 ENCOUNTER — Encounter (HOSPITAL_COMMUNITY): Payer: Self-pay | Admitting: Physician Assistant

## 2023-09-22 ENCOUNTER — Ambulatory Visit (HOSPITAL_COMMUNITY)
Admission: RE | Admit: 2023-09-22 | Discharge: 2023-09-22 | Disposition: A | Discharge status: Home / Self Care | Payer: PPO | Source: Ambulatory Visit | Attending: Physician Assistant | Admitting: Physician Assistant

## 2023-09-22 VITALS — BP 114/84 | HR 96 | Ht 60.0 in | Wt 124.6 lb

## 2023-09-22 DIAGNOSIS — I110 Hypertensive heart disease with heart failure: Secondary | ICD-10-CM | POA: Diagnosis not present

## 2023-09-22 DIAGNOSIS — Z5181 Encounter for therapeutic drug level monitoring: Secondary | ICD-10-CM

## 2023-09-22 DIAGNOSIS — E871 Hypo-osmolality and hyponatremia: Secondary | ICD-10-CM | POA: Insufficient documentation

## 2023-09-22 DIAGNOSIS — Z8679 Personal history of other diseases of the circulatory system: Secondary | ICD-10-CM | POA: Diagnosis not present

## 2023-09-22 DIAGNOSIS — I4819 Other persistent atrial fibrillation: Secondary | ICD-10-CM | POA: Insufficient documentation

## 2023-09-22 DIAGNOSIS — Z7901 Long term (current) use of anticoagulants: Secondary | ICD-10-CM | POA: Insufficient documentation

## 2023-09-22 DIAGNOSIS — Z79899 Other long term (current) drug therapy: Secondary | ICD-10-CM | POA: Insufficient documentation

## 2023-09-22 DIAGNOSIS — I2510 Atherosclerotic heart disease of native coronary artery without angina pectoris: Secondary | ICD-10-CM | POA: Insufficient documentation

## 2023-09-22 DIAGNOSIS — J449 Chronic obstructive pulmonary disease, unspecified: Secondary | ICD-10-CM | POA: Insufficient documentation

## 2023-09-22 DIAGNOSIS — D6869 Other thrombophilia: Secondary | ICD-10-CM | POA: Insufficient documentation

## 2023-09-22 DIAGNOSIS — I5032 Chronic diastolic (congestive) heart failure: Secondary | ICD-10-CM | POA: Diagnosis not present

## 2023-09-22 DIAGNOSIS — E782 Mixed hyperlipidemia: Secondary | ICD-10-CM | POA: Diagnosis not present

## 2023-09-22 DIAGNOSIS — I484 Atypical atrial flutter: Secondary | ICD-10-CM | POA: Diagnosis not present

## 2023-09-22 DIAGNOSIS — I480 Paroxysmal atrial fibrillation: Secondary | ICD-10-CM | POA: Diagnosis present

## 2023-09-22 HISTORY — DX: Other persistent atrial fibrillation: I48.19

## 2023-09-22 HISTORY — DX: Other thrombophilia: D68.69

## 2023-09-22 LAB — CBC
HCT: 36.7 % (ref 36.0–46.0)
Hemoglobin: 12.1 g/dL (ref 12.0–15.0)
MCH: 30.8 pg (ref 26.0–34.0)
MCHC: 33 g/dL (ref 30.0–36.0)
MCV: 93.4 fL (ref 80.0–100.0)
Platelets: 322 10*3/uL (ref 150–400)
RBC: 3.93 MIL/uL (ref 3.87–5.11)
RDW: 12.7 % (ref 11.5–15.5)
WBC: 6.8 10*3/uL (ref 4.0–10.5)
nRBC: 0 % (ref 0.0–0.2)

## 2023-09-22 LAB — BASIC METABOLIC PANEL
Anion gap: 10 (ref 5–15)
BUN: 7 mg/dL — ABNORMAL LOW (ref 8–23)
CO2: 25 mmol/L (ref 22–32)
Calcium: 8.6 mg/dL — ABNORMAL LOW (ref 8.9–10.3)
Chloride: 96 mmol/L — ABNORMAL LOW (ref 98–111)
Creatinine, Ser: 0.45 mg/dL (ref 0.44–1.00)
GFR, Estimated: >60 mL/min (ref >60)
Glucose, Bld: 104 mg/dL — ABNORMAL HIGH (ref 70–99)
Potassium: 3.9 mmol/L (ref 3.5–5.1)
Sodium: 131 mmol/L — ABNORMAL LOW (ref 135–145)

## 2023-09-22 MED ORDER — AMIODARONE HCL 200 MG PO TABS: 200.0000 mg | ORAL_TABLET | Freq: Every day | ORAL | 2 refills | Status: DC

## 2023-09-22 NOTE — Patient Instructions (Addendum)
Reduce amiodarone to 200mg  once a day as of Friday   Cardioversion scheduled for: Friday, December 6th   - Arrive at the [facility] and go to admitting at 630am   - Do not eat or drink anything after midnight the night prior to your procedure.   - Take all your morning medication (except diabetic medications) with a sip of water prior to arrival.  - You will not be able to drive home after your procedure.    - Do NOT miss any doses of your blood thinner - if you should miss a dose please notify our office immediately.   - If you feel as if you go back into normal rhythm prior to scheduled cardioversion, please notify our office immediately.   If your procedure is canceled in the cardioversion suite you will be charged a cancellation fee.   Follow up with Dr. Elberta Fortis in [place] - his scheduler will call to set up

## 2023-09-22 NOTE — Progress Notes (Signed)
Primary Care Physician: [name]., MD Primary Cardiologist: [name], MD Electrophysiologist: Will [name], MD  Referring Physician: Dr Romero Liner is a 67 y.o. female with a history of CAD, CHF, COPD, HLD, HTN, SIADH, mobitz I AV block, atrial flutter, atrial fibrillation who presents for follow up in the [facility]. She was admitted to [facility] with atrial fibrillation. She is post ablation for atrial fibrillation and flutter 05/29/2017. Patient is on Xarelto for a CHADS2VASC score of 5.  She had recurrence of her atrial flutter and she underwent TEE guided DCCV on 08/07/23. She had recurrence of her atrial flutter at follow up on 09/14/23 and her her amiodarone was increased by Dr [name].  On follow up today, patient remains in atrial flutter with symptoms of fatigue. No bleeding issues on anticoagulation.   Today, she denies symptoms of palpitations, chest pain, orthopnea, PND, lower extremity edema, dizziness, presyncope, syncope, snoring, daytime somnolence, bleeding, or neurologic sequela. The patient is tolerating medications without difficulties and is otherwise without complaint today.    Atrial Fibrillation Risk Factors:  she does not have symptoms or diagnosis of sleep apnea. she does not have a history of rheumatic fever.   Atrial Fibrillation Management history:  Previous antiarrhythmic drugs: amiodarone  Previous cardioversions: 08/07/23 Previous ablations: 05/29/17 Anticoagulation history: Xarelto   ROS- All systems are reviewed and negative except as per the HPI above.  Past Medical History:  Diagnosis Date   Acute pulmonary edema (HCC)    Adrenal insufficiency (Addison's disease) (HCC) 04/14/2022   Possible. Saw Endocrine.     Anxiety    Aortic regurgitation 06/17/2017   Mild to moderate    Atherosclerosis of coronary artery of native heart with angina pectoris [facility])    Atrial flutter (HCC)     CAD (coronary artery disease)    Calculus of gallbladder without cholecystitis without obstruction 03/22/2020   CHF (congestive heart failure) (HCC)    Chronic anticoagulation 04/13/2017   Chronic combined systolic and diastolic heart failure (HCC) 04/13/2017   varying EF in the past - 45-50% in 09/2016, 37% by echo 08/2016,  61% by nuc 01/2017 then 30-35% this admission (40% by review from Dr. Mayford Knife)   Chronic obstructive pulmonary disease (HCC) 10/10/2016   COPD (chronic obstructive pulmonary disease) (HCC)    Coronary artery disease involving native coronary artery of native heart with angina pectoris (HCC) 03/25/2017   Overview:  CTO of LCF Lexiscan MPS 07/28/13 with no ischemia, EF 50%   Coronary atherosclerosis of native coronary artery 06/28/2015   CTO of LCF  Lexiscan MPS 07/28/13 with no ischemia, EF 50%   CTO of LCF  Lexiscan MPS 07/28/13 with no ischemia, EF 50%     Degenerative arthritis of cervical spine with nerve compression 01/28/2022   Demand ischemia (HCC)    Depression    Difficult intubation    " small airway and need a little tube "   Diverticulosis    Diverticulosis of colon 05/08/2020   Fibrocystic breast changes of both breasts 02/04/2016   Last Assessment & Plan:   She follows with Dr. Georgiana Shore for this     GERD (gastroesophageal reflux disease)    Gout 05/12/2016   Last Assessment & Plan:  She needs her uric acid level checked    High risk medication use 05/12/2016   History of atrial fibrillation 06/28/2015   Ablation 2018 follows with EP provider on xarelto  Last Assessment &  Plan:  This is stable for her Last Assessment & Plan:  She has not had any episodes of this stable   Hyperlipidemia    Hypertension    Hypertensive heart disease with heart failure (HCC) 03/25/2017   Inappropriate ADH syndrome (HCC)    Inguinal hernia 05/12/2016   Inverted nipple 11/10/2022   Right.  Chronic.     LBBB (left bundle branch block)    Left bundle branch block (LBBB)  06/28/2015   Malaise and fatigue 05/12/2016   Last Assessment & Plan:  Update her TSh for her and her CBC, she is advised to stop smoking    Melasma 11/10/2022   Migraine    "none since ~ 2012; mild ones then when I did have them because of the beta blockers I was on" (06/09/2017)   Mild episode of recurrent major depressive disorder (HCC) 09/27/2016   Mitral regurgitation 06/17/2017   Mild    Mixed hyperlipidemia 06/28/2015   Last Assessment & Plan:    Reviewed with her the last lipids done through cardiology     Mobitz type 1 second degree AV block    Moderate episode of recurrent major depressive disorder (HCC) 09/27/2016   Myocardial infarction (HCC)    "I've had light ones" (06/09/2017)   Near syncope    On amiodarone therapy 04/13/2017   Osteoarthritis    Palpitation 01/13/2018   Paroxysmal atrial fibrillation (HCC) 05/29/2017   Pneumonia    "couple times" (06/09/2017)   Preoperative cardiovascular examination 10/01/2018   Primary osteoarthritis involving multiple joints 05/12/2016   Last Assessment & Plan:   This is stable for her at this time     SIADH (syndrome of inappropriate ADH production) (HCC) 05/12/2016   Last Assessment & Plan:    Her sodium is stable for her     SOB (shortness of breath) on exertion    Statin intolerance 09/22/2019   Statin myopathy 09/27/2019   Tobacco dependence 09/30/2018   V-tach (HCC) 06/28/2015    Current Outpatient Medications  Medication Sig Dispense Refill   acetaminophen (TYLENOL) 500 MG tablet Take 1,000 mg by mouth 2 (two) times daily as needed for mild pain or headache.     albuterol (PROVENTIL HFA;VENTOLIN HFA) 108 (90 Base) MCG/ACT inhaler Inhale 2 puffs into the lungs every 6 (six) hours as needed for wheezing or shortness of breath.     CALCIUM CITRATE-VITAMIN D3 PO Take 1 tablet by mouth daily. Strength 600mg /500 IU     escitalopram (LEXAPRO) 20 MG tablet Take 1 tablet (20 mg total) by mouth daily. 90 tablet 3   ezetimibe  (ZETIA) 10 MG tablet Take 1 tablet (10 mg total) by mouth 2 (two) times a week. 12 tablet 3   fluticasone (FLONASE) 50 MCG/ACT nasal spray Place 2 sprays into both nostrils daily as needed for allergies or rhinitis.     furosemide (LASIX) 20 MG tablet Take 20 mg by mouth daily.     LORazepam (ATIVAN) 1 MG tablet Take 1 mg by mouth 3 (three) times daily as needed for anxiety.      MAGNESIUM PO Take 1 capsule by mouth daily.     metoprolol succinate (TOPROL-XL) 25 MG 24 hr tablet Take 1 tablet (25 mg total) by mouth daily. 90 tablet 2   nitroGLYCERIN (NITROSTAT) 0.4 MG SL tablet Place 0.4 mg under the tongue every 5 (five) minutes as needed for chest pain.     pantoprazole (PROTONIX) 40 MG tablet Take 40 mg by mouth  daily.     potassium chloride (KLOR-CON) 10 MEQ tablet TAKE ONE TABLET BY MOUTH TWICE DAILY 180 tablet 0   sacubitril-valsartan (ENTRESTO) 24-26 MG Take 1 tablet by mouth 2 (two) times daily. 180 tablet 3   traMADol (ULTRAM) 50 MG tablet Take 50-100 mg by mouth every 6 (six) hours as needed for moderate pain.     XARELTO 20 MG TABS tablet TAKE ONE TABLET BY MOUTH ONCE DAILY 90 tablet 1   amiodarone (PACERONE) 200 MG tablet Take 1 tablet (200 mg total) by mouth daily. 90 tablet 2   No current facility-administered medications for this encounter.    Physical Exam: BP 114/84   Pulse 96   Ht 5' (1.524 m)   Wt 56.5 kg   BMI 24.33 kg/m   GEN: Well nourished, well developed in no acute distress NECK: No JVD; No carotid bruits CARDIAC: Regular rate and rhythm, no murmurs, rubs, gallops RESPIRATORY:  Clear to auscultation without rales, wheezing or rhonchi  ABDOMEN: Soft, non-tender, non-distended EXTREMITIES:  No edema; No deformity   Wt Readings from Last 3 Encounters:  09/22/23 56.5 kg  09/14/23 55.4 kg  09/01/23 55 kg     EKG today demonstrates  Atypical atrial flutter with 2:1 block, LBBB Vent. rate 96 BPM PR interval 92 ms QRS duration 148 ms QT/QTcB 404/510  ms    CHA2DS2-VASc Score = 5  The patient's score is based upon: CHF History: 1 HTN History: 1 Diabetes History: 0 Stroke History: 0 Vascular Disease History: 1 Age Score: 1 Gender Score: 1       ASSESSMENT AND PLAN: Paroxysmal Atrial Fibrillation/atrial flutter The patient's CHA2DS2-VASc score is 5, indicating a 7.2% annual risk of stroke.   S/p afib and flutter ablation 05/29/17 We discussed rhythm control options today. Short term, will plan for DCCV since she has loaded on amiodarone. Long term, she would like to discuss repeat ablation with Dr Elberta Fortis.  Continue amiodarone 200 mg BID for now, decrease to 200 mg once daily on day of DCCV.  Continue Toprol 25 mg daily Continue Xarelto 20 mg daily  Secondary Hypercoagulable State (ICD10:  D68.69) The patient is at significant risk for stroke/thromboembolism based upon her CHA2DS2-VASc Score of 5.  Continue Rivaroxaban (Xarelto).   HTN Stable on current regimen  HFimpEF EF normalized post ablation 2018 Fluid status appears stable    Follow up with Dr Elberta Fortis post DCCV to discuss possible repeat ablation.    Informed Consent   Shared Decision Making/Informed Consent The risks (stroke, cardiac arrhythmias rarely resulting in the need for a temporary or permanent pacemaker, skin irritation or burns and complications associated with conscious sedation including aspiration, arrhythmia, respiratory failure and death), benefits (restoration of normal sinus rhythm) and alternatives of a direct current cardioversion were explained in detail to Ms. Welton and she agrees to proceed.         [name] PA-C [facility] [address] [place], [place] [address] [phone]

## 2023-09-22 NOTE — H&P (View-Only) (Signed)
 Primary Care Physician: [name]., MD Primary Cardiologist: [name], MD Electrophysiologist: Will [name], MD  Referring Physician: Dr Romero Liner is a 67 y.o. female with a history of CAD, CHF, COPD, HLD, HTN, SIADH, mobitz I AV block, atrial flutter, atrial fibrillation who presents for follow up in the [facility]. She was admitted to [facility] with atrial fibrillation. She is post ablation for atrial fibrillation and flutter 05/29/2017. Patient is on Xarelto for a CHADS2VASC score of 5.  She had recurrence of her atrial flutter and she underwent TEE guided DCCV on 08/07/23. She had recurrence of her atrial flutter at follow up on 09/14/23 and her her amiodarone was increased by Dr [name].  On follow up today, patient remains in atrial flutter with symptoms of fatigue. No bleeding issues on anticoagulation.   Today, she denies symptoms of palpitations, chest pain, orthopnea, PND, lower extremity edema, dizziness, presyncope, syncope, snoring, daytime somnolence, bleeding, or neurologic sequela. The patient is tolerating medications without difficulties and is otherwise without complaint today.    Atrial Fibrillation Risk Factors:  she does not have symptoms or diagnosis of sleep apnea. she does not have a history of rheumatic fever.   Atrial Fibrillation Management history:  Previous antiarrhythmic drugs: amiodarone  Previous cardioversions: 08/07/23 Previous ablations: 05/29/17 Anticoagulation history: Xarelto   ROS- All systems are reviewed and negative except as per the HPI above.  Past Medical History:  Diagnosis Date   Acute pulmonary edema (HCC)    Adrenal insufficiency (Addison's disease) (HCC) 04/14/2022   Possible. Saw Endocrine.     Anxiety    Aortic regurgitation 06/17/2017   Mild to moderate    Atherosclerosis of coronary artery of native heart with angina pectoris [facility])    Atrial flutter (HCC)     CAD (coronary artery disease)    Calculus of gallbladder without cholecystitis without obstruction 03/22/2020   CHF (congestive heart failure) (HCC)    Chronic anticoagulation 04/13/2017   Chronic combined systolic and diastolic heart failure (HCC) 04/13/2017   varying EF in the past - 45-50% in 09/2016, 37% by echo 08/2016,  61% by nuc 01/2017 then 30-35% this admission (40% by review from Dr. Mayford Knife)   Chronic obstructive pulmonary disease (HCC) 10/10/2016   COPD (chronic obstructive pulmonary disease) (HCC)    Coronary artery disease involving native coronary artery of native heart with angina pectoris (HCC) 03/25/2017   Overview:  CTO of LCF Lexiscan MPS 07/28/13 with no ischemia, EF 50%   Coronary atherosclerosis of native coronary artery 06/28/2015   CTO of LCF  Lexiscan MPS 07/28/13 with no ischemia, EF 50%   CTO of LCF  Lexiscan MPS 07/28/13 with no ischemia, EF 50%     Degenerative arthritis of cervical spine with nerve compression 01/28/2022   Demand ischemia (HCC)    Depression    Difficult intubation    " small airway and need a little tube "   Diverticulosis    Diverticulosis of colon 05/08/2020   Fibrocystic breast changes of both breasts 02/04/2016   Last Assessment & Plan:   She follows with Dr. Georgiana Shore for this     GERD (gastroesophageal reflux disease)    Gout 05/12/2016   Last Assessment & Plan:  She needs her uric acid level checked    High risk medication use 05/12/2016   History of atrial fibrillation 06/28/2015   Ablation 2018 follows with EP provider on xarelto  Last Assessment &  Plan:  This is stable for her Last Assessment & Plan:  She has not had any episodes of this stable   Hyperlipidemia    Hypertension    Hypertensive heart disease with heart failure (HCC) 03/25/2017   Inappropriate ADH syndrome (HCC)    Inguinal hernia 05/12/2016   Inverted nipple 11/10/2022   Right.  Chronic.     LBBB (left bundle branch block)    Left bundle branch block (LBBB)  06/28/2015   Malaise and fatigue 05/12/2016   Last Assessment & Plan:  Update her TSh for her and her CBC, she is advised to stop smoking    Melasma 11/10/2022   Migraine    "none since ~ 2012; mild ones then when I did have them because of the beta blockers I was on" (06/09/2017)   Mild episode of recurrent major depressive disorder (HCC) 09/27/2016   Mitral regurgitation 06/17/2017   Mild    Mixed hyperlipidemia 06/28/2015   Last Assessment & Plan:    Reviewed with her the last lipids done through cardiology     Mobitz type 1 second degree AV block    Moderate episode of recurrent major depressive disorder (HCC) 09/27/2016   Myocardial infarction (HCC)    "I've had light ones" (06/09/2017)   Near syncope    On amiodarone therapy 04/13/2017   Osteoarthritis    Palpitation 01/13/2018   Paroxysmal atrial fibrillation (HCC) 05/29/2017   Pneumonia    "couple times" (06/09/2017)   Preoperative cardiovascular examination 10/01/2018   Primary osteoarthritis involving multiple joints 05/12/2016   Last Assessment & Plan:   This is stable for her at this time     SIADH (syndrome of inappropriate ADH production) (HCC) 05/12/2016   Last Assessment & Plan:    Her sodium is stable for her     SOB (shortness of breath) on exertion    Statin intolerance 09/22/2019   Statin myopathy 09/27/2019   Tobacco dependence 09/30/2018   V-tach (HCC) 06/28/2015    Current Outpatient Medications  Medication Sig Dispense Refill   acetaminophen (TYLENOL) 500 MG tablet Take 1,000 mg by mouth 2 (two) times daily as needed for mild pain or headache.     albuterol (PROVENTIL HFA;VENTOLIN HFA) 108 (90 Base) MCG/ACT inhaler Inhale 2 puffs into the lungs every 6 (six) hours as needed for wheezing or shortness of breath.     CALCIUM CITRATE-VITAMIN D3 PO Take 1 tablet by mouth daily. Strength 600mg /500 IU     escitalopram (LEXAPRO) 20 MG tablet Take 1 tablet (20 mg total) by mouth daily. 90 tablet 3   ezetimibe  (ZETIA) 10 MG tablet Take 1 tablet (10 mg total) by mouth 2 (two) times a week. 12 tablet 3   fluticasone (FLONASE) 50 MCG/ACT nasal spray Place 2 sprays into both nostrils daily as needed for allergies or rhinitis.     furosemide (LASIX) 20 MG tablet Take 20 mg by mouth daily.     LORazepam (ATIVAN) 1 MG tablet Take 1 mg by mouth 3 (three) times daily as needed for anxiety.      MAGNESIUM PO Take 1 capsule by mouth daily.     metoprolol succinate (TOPROL-XL) 25 MG 24 hr tablet Take 1 tablet (25 mg total) by mouth daily. 90 tablet 2   nitroGLYCERIN (NITROSTAT) 0.4 MG SL tablet Place 0.4 mg under the tongue every 5 (five) minutes as needed for chest pain.     pantoprazole (PROTONIX) 40 MG tablet Take 40 mg by mouth  daily.     potassium chloride (KLOR-CON) 10 MEQ tablet TAKE ONE TABLET BY MOUTH TWICE DAILY 180 tablet 0   sacubitril-valsartan (ENTRESTO) 24-26 MG Take 1 tablet by mouth 2 (two) times daily. 180 tablet 3   traMADol (ULTRAM) 50 MG tablet Take 50-100 mg by mouth every 6 (six) hours as needed for moderate pain.     XARELTO 20 MG TABS tablet TAKE ONE TABLET BY MOUTH ONCE DAILY 90 tablet 1   amiodarone (PACERONE) 200 MG tablet Take 1 tablet (200 mg total) by mouth daily. 90 tablet 2   No current facility-administered medications for this encounter.    Physical Exam: BP 114/84   Pulse 96   Ht 5' (1.524 m)   Wt 56.5 kg   BMI 24.33 kg/m   GEN: Well nourished, well developed in no acute distress NECK: No JVD; No carotid bruits CARDIAC: Regular rate and rhythm, no murmurs, rubs, gallops RESPIRATORY:  Clear to auscultation without rales, wheezing or rhonchi  ABDOMEN: Soft, non-tender, non-distended EXTREMITIES:  No edema; No deformity   Wt Readings from Last 3 Encounters:  09/22/23 56.5 kg  09/14/23 55.4 kg  09/01/23 55 kg     EKG today demonstrates  Atypical atrial flutter with 2:1 block, LBBB Vent. rate 96 BPM PR interval 92 ms QRS duration 148 ms QT/QTcB 404/510  ms    CHA2DS2-VASc Score = 5  The patient's score is based upon: CHF History: 1 HTN History: 1 Diabetes History: 0 Stroke History: 0 Vascular Disease History: 1 Age Score: 1 Gender Score: 1       ASSESSMENT AND PLAN: Paroxysmal Atrial Fibrillation/atrial flutter The patient's CHA2DS2-VASc score is 5, indicating a 7.2% annual risk of stroke.   S/p afib and flutter ablation 05/29/17 We discussed rhythm control options today. Short term, will plan for DCCV since she has loaded on amiodarone. Long term, she would like to discuss repeat ablation with Dr Elberta Fortis.  Continue amiodarone 200 mg BID for now, decrease to 200 mg once daily on day of DCCV.  Continue Toprol 25 mg daily Continue Xarelto 20 mg daily  Secondary Hypercoagulable State (ICD10:  D68.69) The patient is at significant risk for stroke/thromboembolism based upon her CHA2DS2-VASc Score of 5.  Continue Rivaroxaban (Xarelto).   HTN Stable on current regimen  HFimpEF EF normalized post ablation 2018 Fluid status appears stable    Follow up with Dr Elberta Fortis post DCCV to discuss possible repeat ablation.    Informed Consent   Shared Decision Making/Informed Consent The risks (stroke, cardiac arrhythmias rarely resulting in the need for a temporary or permanent pacemaker, skin irritation or burns and complications associated with conscious sedation including aspiration, arrhythmia, respiratory failure and death), benefits (restoration of normal sinus rhythm) and alternatives of a direct current cardioversion were explained in detail to Ms. Welton and she agrees to proceed.         [name] PA-C [facility] [address] [place], [place] [address] [phone]

## 2023-09-24 NOTE — OR Nursing (Signed)
Called patient with pre-procedure instructions for tomorrow.   Patient informed of:   Time to arrive for procedure. 0630 Remain NPO past midnight.  Must have a ride home and a responsible adult to remain with them for 24 hours post procedure.  Confirmed blood thinner. Confirmed no breaks in taking blood thinner for 3+ weeks prior to procedure. Xarelto. Confirmed patient stopped all GLP-1s and GLP-2s for at least one week before procedure.

## 2023-09-25 ENCOUNTER — Ambulatory Visit (HOSPITAL_COMMUNITY): Payer: PPO | Admitting: Anesthesiology

## 2023-09-25 ENCOUNTER — Ambulatory Visit (HOSPITAL_COMMUNITY)
Admission: RE | Admit: 2023-09-25 | Discharge: 2023-09-25 | Disposition: A | Discharge status: Home / Self Care | Payer: PPO | Attending: Cardiovascular Disease | Admitting: Cardiovascular Disease

## 2023-09-25 ENCOUNTER — Other Ambulatory Visit: Payer: Self-pay

## 2023-09-25 ENCOUNTER — Encounter (HOSPITAL_COMMUNITY): Payer: Self-pay | Admitting: Cardiovascular Disease

## 2023-09-25 ENCOUNTER — Encounter (HOSPITAL_COMMUNITY)
Admission: RE | Disposition: A | Discharge status: Home / Self Care | Payer: Self-pay | Source: Home / Self Care | Attending: Cardiovascular Disease

## 2023-09-25 DIAGNOSIS — I484 Atypical atrial flutter: Secondary | ICD-10-CM | POA: Diagnosis not present

## 2023-09-25 DIAGNOSIS — Z79899 Other long term (current) drug therapy: Secondary | ICD-10-CM | POA: Insufficient documentation

## 2023-09-25 DIAGNOSIS — D6869 Other thrombophilia: Secondary | ICD-10-CM | POA: Diagnosis not present

## 2023-09-25 DIAGNOSIS — I480 Paroxysmal atrial fibrillation: Secondary | ICD-10-CM | POA: Insufficient documentation

## 2023-09-25 DIAGNOSIS — I2510 Atherosclerotic heart disease of native coronary artery without angina pectoris: Secondary | ICD-10-CM | POA: Insufficient documentation

## 2023-09-25 DIAGNOSIS — I4892 Unspecified atrial flutter: Secondary | ICD-10-CM | POA: Insufficient documentation

## 2023-09-25 DIAGNOSIS — E785 Hyperlipidemia, unspecified: Secondary | ICD-10-CM | POA: Insufficient documentation

## 2023-09-25 DIAGNOSIS — I4891 Unspecified atrial fibrillation: Secondary | ICD-10-CM

## 2023-09-25 DIAGNOSIS — K219 Gastro-esophageal reflux disease without esophagitis: Secondary | ICD-10-CM | POA: Insufficient documentation

## 2023-09-25 DIAGNOSIS — Z87891 Personal history of nicotine dependence: Secondary | ICD-10-CM | POA: Diagnosis not present

## 2023-09-25 DIAGNOSIS — I110 Hypertensive heart disease with heart failure: Secondary | ICD-10-CM | POA: Insufficient documentation

## 2023-09-25 DIAGNOSIS — I441 Atrioventricular block, second degree: Secondary | ICD-10-CM | POA: Diagnosis not present

## 2023-09-25 DIAGNOSIS — J449 Chronic obstructive pulmonary disease, unspecified: Secondary | ICD-10-CM | POA: Insufficient documentation

## 2023-09-25 DIAGNOSIS — I5022 Chronic systolic (congestive) heart failure: Secondary | ICD-10-CM | POA: Insufficient documentation

## 2023-09-25 DIAGNOSIS — Z7901 Long term (current) use of anticoagulants: Secondary | ICD-10-CM | POA: Diagnosis not present

## 2023-09-25 HISTORY — PX: CARDIOVERSION: EP1203

## 2023-09-25 SURGERY — CARDIOVERSION (CATH LAB)
Anesthesia: General

## 2023-09-25 MED ORDER — PROPOFOL 10 MG/ML IV BOLUS
INTRAVENOUS | Status: DC | PRN
  Administered 2023-09-25: 40 mg via INTRAVENOUS
  Administered 2023-09-25: 10 mg via INTRAVENOUS

## 2023-09-25 MED ORDER — SODIUM CHLORIDE 0.9% FLUSH: 10.0000 mL | Freq: Two times a day (BID) | INTRAVENOUS | Status: DC

## 2023-09-25 SURGICAL SUPPLY — 1 items: PAD DEFIB RADIO PHYSIO CONN (PAD) ×1 IMPLANT

## 2023-09-25 NOTE — Transfer of Care (Signed)
Immediate Anesthesia Transfer of Care Note  Patient: Jennifer Martin  Procedure(s) Performed: CARDIOVERSION  Patient Location: PACU and Cath Lab  Anesthesia Type:General  Level of Consciousness: awake  Airway & Oxygen Therapy: Patient connected to face mask oxygen  Post-op Assessment: Report given to RN  Post vital signs: stable  Last Vitals:  Vitals Value Taken Time  BP    Temp    Pulse    Resp    SpO2      Last Pain: There were no vitals filed for this visit.       Complications: No notable events documented.

## 2023-09-25 NOTE — Discharge Instructions (Signed)

## 2023-09-25 NOTE — Interval H&P Note (Signed)
History and Physical Interval Note:  09/25/2023 7:22 AM  Jennifer Martin  has presented today for surgery, with the diagnosis of afib.  The various methods of treatment have been discussed with the patient and family. After consideration of risks, benefits and other options for treatment, the patient has consented to  Procedure(s): CARDIOVERSION (N/A) as a surgical intervention.  The patient's history has been reviewed, patient examined, no change in status, stable for surgery.  I have reviewed the patient's chart and labs.  Questions were answered to the patient's satisfaction.     Shital Crayton

## 2023-09-25 NOTE — Anesthesia Postprocedure Evaluation (Signed)
Anesthesia Post Note  Patient: Jennifer Martin  Procedure(s) Performed: CARDIOVERSION     Patient location during evaluation: PACU Anesthesia Type: MAC Level of consciousness: awake Pain management: pain level controlled Vital Signs Assessment: post-procedure vital signs reviewed and stable Respiratory status: spontaneous breathing, nonlabored ventilation and respiratory function stable Cardiovascular status: blood pressure returned to baseline and stable Postop Assessment: no apparent nausea or vomiting Anesthetic complications: no   No notable events documented.  Last Vitals:  Vitals:   09/25/23 0800 09/25/23 0810  BP: 121/85 122/85  Pulse: (!) 56 (!) 55  Resp: 19 17  Temp:    SpO2: 98% 97%    Last Pain:  Vitals:   09/25/23 0747  TempSrc: Temporal  PainSc: 0-No pain                 Tennis Mckinnon P Nayda Riesen

## 2023-09-25 NOTE — Op Note (Signed)
Procedure: Electrical Cardioversion Indications:  Atrial Flutter  Procedure Details:  Consent: Risks of procedure as well as the alternatives and risks of each were explained to the (patient/caregiver).  Consent for procedure obtained.  Time Out: Verified patient identification, verified procedure, site/side was marked, verified correct patient position, special equipment/implants available, medications/allergies/relevent history reviewed, required imaging and test results available.  Performed  Patient placed on cardiac monitor, pulse oximetry, supplemental oxygen as necessary.  Sedation given:  propofol 50 mg IV, Dr. Bradley Ferris Pacer pads placed anterior and posterior chest.  Cardioverted 1 time(s).  Cardioversion with synchronized biphasic 100J shock.  Evaluation: Findings: Post procedure EKG shows: NSR Complications: None Patient did tolerate procedure well.  Time Spent Directly with the Patient:  30 minutes   Jennifer Martin 09/25/2023, 7:42 AM

## 2023-09-25 NOTE — Anesthesia Preprocedure Evaluation (Addendum)
Anesthesia Evaluation  Patient identified by MRN, date of birth, ID band Patient awake    Reviewed: Allergy & Precautions, NPO status , Patient's Chart, lab work & pertinent test results  History of Anesthesia Complications (+) DIFFICULT AIRWAY and history of anesthetic complications  Airway Mallampati: II  TM Distance: >3 FB Neck ROM: Full    Dental no notable dental hx.    Pulmonary COPD,  COPD inhaler, Current Smoker and Patient abstained from smoking.   Pulmonary exam normal        Cardiovascular hypertension, Pt. on home beta blockers and Pt. on medications + CAD and +CHF  Normal cardiovascular exam+ dysrhythmias Atrial Fibrillation      Neuro/Psych  Headaches PSYCHIATRIC DISORDERS Anxiety Depression     Neuromuscular disease    GI/Hepatic Neg liver ROS,GERD  Medicated and Controlled,,  Endo/Other  negative endocrine ROS    Renal/GU negative Renal ROS     Musculoskeletal  (+) Arthritis ,    Abdominal   Peds  Hematology  (+) Blood dyscrasia (Xarelto)   Anesthesia Other Findings A-fib  Reproductive/Obstetrics                             Anesthesia Physical Anesthesia Plan  ASA: 3  Anesthesia Plan: MAC   Post-op Pain Management:    Induction: Intravenous  PONV Risk Score and Plan: 1 and Propofol infusion and Treatment may vary due to age or medical condition  Airway Management Planned: Mask  Additional Equipment:   Intra-op Plan:   Post-operative Plan:   Informed Consent: I have reviewed the patients History and Physical, chart, labs and discussed the procedure including the risks, benefits and alternatives for the proposed anesthesia with the patient or authorized representative who has indicated his/her understanding and acceptance.     Dental advisory given  Plan Discussed with: CRNA  Anesthesia Plan Comments:        Anesthesia Quick Evaluation

## 2023-09-29 ENCOUNTER — Ambulatory Visit: Payer: PPO

## 2023-10-05 ENCOUNTER — Telehealth: Payer: Self-pay | Admitting: Cardiology

## 2023-10-05 ENCOUNTER — Other Ambulatory Visit: Payer: Self-pay

## 2023-10-05 MED ORDER — ENTRESTO 24-26 MG PO TABS: 1.0000 | ORAL_TABLET | Freq: Two times a day (BID) | ORAL | 3 refills | Status: AC

## 2023-10-05 NOTE — Telephone Encounter (Signed)
  Pt c/o medication issue:  1. Name of Medication:   sacubitril-valsartan (ENTRESTO) 24-26 MG    2. How are you currently taking this medication (dosage and times per day)? Take 1 tablet by mouth 2 (two) times daily.   3. Are you having a reaction (difficulty breathing--STAT)? No   4. What is your medication issue? Pt is calling to make sure this medication is update to the correct dosage and also that it was sent to her pharmacy at [facility]. She is almost out of medication and haven't receive her refill yet

## 2023-10-05 NOTE — Telephone Encounter (Signed)
Called patient and informed her that her Entresto medication had been re-filled with the correct dosage. Patient verbalized understanding and had no further questions at this time.

## 2023-10-06 ENCOUNTER — Ambulatory Visit: Payer: PPO | Attending: Cardiology

## 2023-10-06 DIAGNOSIS — E871 Hypo-osmolality and hyponatremia: Secondary | ICD-10-CM

## 2023-10-06 DIAGNOSIS — I110 Hypertensive heart disease with heart failure: Secondary | ICD-10-CM | POA: Diagnosis not present

## 2023-10-06 DIAGNOSIS — Z7901 Long term (current) use of anticoagulants: Secondary | ICD-10-CM

## 2023-10-06 DIAGNOSIS — I484 Atypical atrial flutter: Secondary | ICD-10-CM

## 2023-10-06 MED ORDER — PERFLUTREN LIPID MICROSPHERE
1.0000 mL | INTRAVENOUS | Status: AC | PRN
  Administered 2023-10-06: 8 mL via INTRAVENOUS

## 2023-10-07 LAB — ECHOCARDIOGRAM COMPLETE
Calc EF: 44.8 %
P 1/2 time: 555 ms
S' Lateral: 3.6 cm
Single Plane A2C EF: 45.9 %
Single Plane A4C EF: 34.5 %

## 2023-10-19 ENCOUNTER — Other Ambulatory Visit: Payer: Self-pay | Admitting: Cardiology

## 2023-10-19 DIAGNOSIS — I5043 Acute on chronic combined systolic (congestive) and diastolic (congestive) heart failure: Secondary | ICD-10-CM

## 2023-10-28 ENCOUNTER — Other Ambulatory Visit: Payer: Self-pay

## 2023-11-17 NOTE — Telephone Encounter (Signed)
Pt is requesting a callback from nurse Richard regarding this medication and her still not receiving it since she was told her application hadn't been received. Please advise

## 2023-11-17 NOTE — Telephone Encounter (Signed)
Called patient and she stated that the [employer] had not received her patient assistance application. I explained that we had already faxed the application to them, and that I would fax it to them again. Her application was re-faxed to the [employer]. Patient verbalized understanding and had no further questions at this time.

## 2023-11-18 ENCOUNTER — Telehealth: Payer: Self-pay

## 2023-11-18 NOTE — Telephone Encounter (Signed)
Received fax from [employer] stating patient was approved for her Entresto until 10/19/24.

## 2023-11-30 ENCOUNTER — Ambulatory Visit: Payer: PPO | Admitting: Cardiology

## 2023-11-30 ENCOUNTER — Other Ambulatory Visit: Payer: Self-pay

## 2023-11-30 ENCOUNTER — Telehealth: Payer: Self-pay | Admitting: Cardiology

## 2023-11-30 NOTE — Telephone Encounter (Signed)
 Called the patient and she reported that she was approved for her Entresto  but they did not have an order for it. Looking on her chart the medication is listed on her medication list. Will contact [employer] to determine what they need to fill the patient's prescription.Patient verbalized understanding and had no further questions at this time.

## 2023-11-30 NOTE — Telephone Encounter (Signed)
 Patient has been notified that her medicine (Entresto ) has been approved by [employer] but she has not been able to receive her medicine. When patient calls they tell her there is no order/rx in the system. Please call patient for more details- [phone].

## 2023-12-01 ENCOUNTER — Other Ambulatory Visit: Payer: Self-pay | Admitting: Cardiology

## 2023-12-08 NOTE — Telephone Encounter (Signed)
Called the [employer] and they stated that they had received the prescription for the [facility] and the medication had been shipped to the patient's house and she had received it on 12/03/23. Called the patient and verified that she had received the medication and she had received it. Patient verbalized understanding and had no further questions at this time.

## 2023-12-10 ENCOUNTER — Encounter: Payer: Self-pay | Admitting: Cardiology

## 2023-12-11 NOTE — Progress Notes (Unsigned)
 Cardiology Office Note:    Date:  12/15/2023   ID:  Jennifer Martin, DOB Aug 13, 1956, MRN [id number]  PCP:  Jennifer Martin., MD  Cardiologist:  [name], MD    Referring MD: Jennifer Martin., MD    ASSESSMENT:    1. Atypical atrial flutter (HCC)   2. On amiodarone therapy   3. Chronic anticoagulation   4. Hypertensive heart disease with heart failure (HCC)   5. Left bundle branch block (LBBB)   6. Coronary artery disease of native artery of native heart with stable angina pectoris (HCC)   7. Mixed hyperlipidemia   8. Statin intolerance    PLAN:    In order of problems listed above:  Jennifer Martin is doing well with her recurrent atrial arrhythmia maintaining sinus rhythm continue low-dose amiodarone and current anticoagulant. I will recheck labs today including thyroid and CMP Blood pressure at target heart failure compensated continue grams daily along with low-dose and Entresto low-dose. Stable EKG pattern of left bundle branch block first-degree AV block She is having no anginal discomfort with CAD continue her medical treatment Continue her nonstatin she is intolerant with myopathy and recheck a lipid profile today on Zetia 10 mg daily   Next appointment: 6 months   Medication Adjustments/Labs and Tests Ordered: Current medicines are reviewed at length with the patient today.  Concerns regarding medicines are outlined above.  Orders Placed This Encounter  Procedures   EKG 12-Lead   No orders of the defined types were placed in this encounter.    History of Present Illness:    Jennifer Martin is a 68 y.o. female with a hx of paroxysmal atrial fibrillation atrial flutter with recurrence on amiodarone therapy chronic anticoagulation hypertensive heart disease with heart failure previous severe symptomatic hyponatremia coronary artery disease paroxysmal atrial fibrillation flutter with previous EP catheter ablation hyperlipidemia left bundle branch block COPD and statin  intolerance with myopathy.  She was last seen 06/14/2023.  Her EKG 09/25/2023 showed sinus rhythm. The visit she had an echocardiogram performed showing a normal ejection fraction 50 to 55% normal right ventricular size and function mild left atrial enlargement and aortic regurgitation.  Compliance with diet, lifestyle and medications: Yes  Overall she is doing well no recurrence of her atrial fibrillation flutter since her last cardioversion 09/25/2023. Not having edema shortness of breath orthopnea chest pain palpitation or syncope No bleeding from her anticoagulant and she tolerates her lipid-lowering therapy with Zetia being statin intolerant. Her most recent echo 10/06/2023 showed normalization of ejection fraction Past Medical History:  Diagnosis Date   Acute pulmonary edema (HCC)    Adrenal insufficiency (Addison's disease) (HCC) 04/14/2022   Possible. Saw Endocrine.     Anxiety    Aortic regurgitation 06/17/2017   Mild to moderate    Atherosclerosis of coronary artery of native heart with angina pectoris [facility])    Atrial flutter (HCC)    CAD (coronary artery disease)    Calculus of gallbladder without cholecystitis without obstruction 03/22/2020   CHF (congestive heart failure) (HCC)    Chronic anticoagulation 04/13/2017   Chronic combined systolic and diastolic heart failure (HCC) 04/13/2017   varying EF in the past - 45-50% in 09/2016, 37% by echo 08/2016,  61% by nuc 01/2017 then 30-35% this admission (40% by review from Dr. Mayford Knife)   Chronic obstructive pulmonary disease (HCC) 10/10/2016   COPD (chronic obstructive pulmonary disease) (HCC)    Coronary artery disease involving native coronary artery of native heart with  angina pectoris (HCC) 03/25/2017   Overview:  CTO of LCF Lexiscan MPS 07/28/13 with no ischemia, EF 50%   Coronary atherosclerosis of native coronary artery 06/28/2015   CTO of LCF  Lexiscan MPS 07/28/13 with no ischemia, EF 50%   CTO of LCF  Lexiscan MPS 07/28/13  with no ischemia, EF 50%     Degenerative arthritis of cervical spine with nerve compression 01/28/2022   Demand ischemia (HCC)    Depression    Difficult intubation    " small airway and need a little tube "   Diverticulosis    Diverticulosis of colon 05/08/2020   Fibrocystic breast changes of both breasts 02/04/2016   Last Assessment & Plan:   She follows with Dr. Georgiana Shore for this     GERD (gastroesophageal reflux disease)    Gout 05/12/2016   Last Assessment & Plan:  She needs her uric acid level checked    High risk medication use 05/12/2016   History of atrial fibrillation 06/28/2015   Ablation 2018 follows with EP provider on xarelto  Last Assessment & Plan:  This is stable for her Last Assessment & Plan:  She has not had any episodes of this stable   Hypercoagulable state due to persistent atrial fibrillation (HCC) 09/22/2023   Hyperlipidemia    Hypertension    Hypertensive heart disease with heart failure (HCC) 03/25/2017   Inappropriate ADH syndrome (HCC)    Inguinal hernia 05/12/2016   Inverted nipple 11/10/2022   Right.  Chronic.     LBBB (left bundle branch block)    Left bundle branch block (LBBB) 06/28/2015   Lipoma of skin and subcutaneous tissue of trunk 06/09/2023   Malaise and fatigue 05/12/2016   Last Assessment & Plan:  Update her TSh for her and her CBC, she is advised to stop smoking    Melasma 11/10/2022   Migraine    "none since ~ 2012; mild ones then when I did have them because of the beta blockers I was on" (06/09/2017)   Mild episode of recurrent major depressive disorder (HCC) 09/27/2016   Mitral regurgitation 06/17/2017   Mild    Mixed hyperlipidemia 06/28/2015   Last Assessment & Plan:    Reviewed with her the last lipids done through cardiology     Mobitz type 1 second degree AV block    Moderate episode of recurrent major depressive disorder (HCC) 09/27/2016   Myocardial infarction (HCC)    "I've had light ones" (06/09/2017)   Near syncope     On amiodarone therapy 04/13/2017   Osteoarthritis    Palpitation 01/13/2018   Paroxysmal atrial fibrillation (HCC) 05/29/2017   Pneumonia    "couple times" (06/09/2017)   Preoperative cardiovascular examination 10/01/2018   Primary osteoarthritis involving multiple joints 05/12/2016   Last Assessment & Plan:   This is stable for her at this time     SIADH (syndrome of inappropriate ADH production) (HCC) 05/12/2016   Last Assessment & Plan:    Her sodium is stable for her     SOB (shortness of breath) on exertion    Statin intolerance 09/22/2019   Statin myopathy 09/27/2019   Tobacco dependence 09/30/2018   V-tach (HCC) 06/28/2015    Current Medications: Current Meds  Medication Sig   acetaminophen (TYLENOL) 500 MG tablet Take 1,000 mg by mouth 2 (two) times daily as needed for mild pain or headache.   albuterol (PROVENTIL HFA;VENTOLIN HFA) 108 (90 Base) MCG/ACT inhaler Inhale 2 puffs into the lungs every  6 (six) hours as needed for wheezing or shortness of breath.   amiodarone (PACERONE) 200 MG tablet Take 1 tablet (200 mg total) by mouth daily.   ascorbic acid (VITAMIN C) 500 MG tablet Take 500 mg by mouth daily.   Budeson-Glycopyrrol-Formoterol (BREZTRI AEROSPHERE) 160-9-4.8 MCG/ACT AERO Inhale 1 puff into the lungs in the morning and at bedtime.   Calcium Carb-Cholecalciferol (CALCIUM + VITAMIN D3 PO) Take 1 tablet by mouth daily.   CALCIUM CITRATE-VITAMIN D3 PO Take 1 tablet by mouth daily. Strength 600mg /500 IU   cholecalciferol (VITAMIN D3) 25 MCG (1000 UNIT) tablet Take 1,000 Units by mouth daily.   escitalopram (LEXAPRO) 20 MG tablet Take 1 tablet (20 mg total) by mouth daily.   ezetimibe (ZETIA) 10 MG tablet Take 1 tablet (10 mg total) by mouth 2 (two) times a week.   fluticasone (FLONASE) 50 MCG/ACT nasal spray Place 2 sprays into both nostrils daily as needed for allergies or rhinitis.   furosemide (LASIX) 20 MG tablet Take 1 tablet (20 mg total) by mouth 2 (two) times  daily. Take only one daily if you weigh 124lbs or less.   levalbuterol (XOPENEX) 1.25 MG/3ML nebulizer solution Take 1.25 mg by nebulization every 4 (four) hours as needed for wheezing or shortness of breath.   LORazepam (ATIVAN) 1 MG tablet Take 1 mg by mouth 3 (three) times daily as needed for anxiety.    MAGNESIUM PO Take 1 capsule by mouth daily.   metoprolol succinate (TOPROL-XL) 25 MG 24 hr tablet Take 1 tablet (25 mg total) by mouth daily.   nitroGLYCERIN (NITROSTAT) 0.4 MG SL tablet Place 0.4 mg under the tongue every 5 (five) minutes as needed for chest pain.   pantoprazole (PROTONIX) 40 MG tablet Take 40 mg by mouth daily.   potassium chloride (KLOR-CON) 10 MEQ tablet TAKE ONE TABLET BY MOUTH TWICE DAILY   sacubitril-valsartan (ENTRESTO) 24-26 MG Take 1 tablet by mouth 2 (two) times daily.   traMADol (ULTRAM) 50 MG tablet Take 50-100 mg by mouth every 6 (six) hours as needed for moderate pain.   XARELTO 20 MG TABS tablet TAKE ONE TABLET BY MOUTH ONCE DAILY   zinc gluconate 50 MG tablet Take 50 mg by mouth daily.      EKGs/Labs/Other Studies Reviewed:    The following studies were reviewed today:    EKG Interpretation Date/Time:  Tuesday December 15 2023 11:24:51 EST Ventricular Rate:  63 PR Interval:  246 QRS Duration:  144 QT Interval:  490 QTC Calculation: 501 R Axis:   -38  Text Interpretation: Sinus rhythm with 1st degree A-V block Left axis deviation Left bundle branch block When compared with ECG of 25-Sep-2023 07:46, No significant change was found Confirmed by [name] ([id number]) on 12/15/2023 11:29:40 AM   Recent Labs: 09/22/2023: BUN 7; Creatinine, Ser 0.45; Hemoglobin 12.1; Platelets 322; Potassium 3.9; Sodium 131  Recent Lipid Panel    Component Value Date/Time   CHOL 211 (H) 06/26/2021 1120   TRIG 120 06/26/2021 1120   HDL 68 06/26/2021 1120   CHOLHDL 3.1 06/26/2021 1120   CHOLHDL 3.2 06/11/2017 1439   VLDL 20 06/11/2017 1439   LDLCALC 122 (H)  06/26/2021 1120    Physical Exam:    VS:  BP 124/78   Pulse 63   Ht 5' (1.524 m)   Wt 123 lb 7.2 oz (56 kg)   SpO2 98%   BMI 24.11 kg/m     Wt Readings from Last 3 Encounters:  12/15/23 123 lb 7.2 oz (56 kg)  09/25/23 122 lb (55.3 kg)  09/22/23 124 lb 9.6 oz (56.5 kg)     GEN:  Well nourished, well developed in no acute distress HEENT: Normal NECK: No JVD; No carotid bruits LYMPHATICS: No lymphadenopathy CARDIAC: RRR, no murmurs, rubs, gallops RESPIRATORY:  Clear to auscultation without rales, wheezing or rhonchi  ABDOMEN: Soft, non-tender, non-distended MUSCULOSKELETAL:  No edema; No deformity  SKIN: Warm and dry NEUROLOGIC:  Alert and oriented x 3 PSYCHIATRIC:  Normal affect    Signed, [name], MD  12/15/2023 11:40 AM    [facility]

## 2023-12-15 ENCOUNTER — Encounter: Payer: Self-pay | Admitting: Cardiology

## 2023-12-15 ENCOUNTER — Ambulatory Visit: Payer: PPO | Attending: Cardiology | Admitting: Cardiology

## 2023-12-15 VITALS — BP 124/78 | HR 63 | Ht 60.0 in | Wt 123.5 lb

## 2023-12-15 DIAGNOSIS — I110 Hypertensive heart disease with heart failure: Secondary | ICD-10-CM

## 2023-12-15 DIAGNOSIS — I25118 Atherosclerotic heart disease of native coronary artery with other forms of angina pectoris: Secondary | ICD-10-CM

## 2023-12-15 DIAGNOSIS — E782 Mixed hyperlipidemia: Secondary | ICD-10-CM

## 2023-12-15 DIAGNOSIS — I447 Left bundle-branch block, unspecified: Secondary | ICD-10-CM

## 2023-12-15 DIAGNOSIS — Z7901 Long term (current) use of anticoagulants: Secondary | ICD-10-CM

## 2023-12-15 DIAGNOSIS — Z79899 Other long term (current) drug therapy: Secondary | ICD-10-CM | POA: Diagnosis not present

## 2023-12-15 DIAGNOSIS — Z789 Other specified health status: Secondary | ICD-10-CM

## 2023-12-15 DIAGNOSIS — I484 Atypical atrial flutter: Secondary | ICD-10-CM | POA: Diagnosis not present

## 2023-12-15 NOTE — Patient Instructions (Signed)
 Medication Instructions:  Your physician recommends that you continue on your current medications as directed. Please refer to the Current Medication list given to you today.  *If you need a refill on your cardiac medications before your next appointment, please call your pharmacy*   Lab Work: Your physician recommends that you return for lab work in:   Labs today: CMP, TSH T3 T4, Lipids  If you have labs (blood work) drawn today and your tests are completely normal, you will receive your results only by: MyChart Message (if you have MyChart) OR A paper copy in the mail If you have any lab test that is abnormal or we need to change your treatment, we will call you to review the results.   Testing/Procedures: None   Follow-Up: At [facility], you and your health needs are our priority.  As part of our continuing mission to provide you with exceptional heart care, we have created designated Provider Care Teams.  These Care Teams include your primary Cardiologist (physician) and Advanced Practice Providers (APPs -  Physician Assistants and Nurse Practitioners) who all work together to provide you with the care you need, when you need it.  We recommend signing up for the patient portal called "MyChart".  Sign up information is provided on this After Visit Summary.  MyChart is used to connect with patients for Virtual Visits (Telemedicine).  Patients are able to view lab/test results, encounter notes, upcoming appointments, etc.  Non-urgent messages can be sent to your provider as well.   To learn more about what you can do with MyChart, go to [email or web address].    Your next appointment:   6 month(s)  Provider:   Shirlee More, MD    Other Instructions None

## 2023-12-16 LAB — LIPID PANEL
Chol/HDL Ratio: 2.6 {ratio} (ref 0.0–4.4)
Cholesterol, Total: 182 mg/dL (ref 100–199)
HDL: 71 mg/dL (ref >39)
LDL Chol Calc (NIH): 94 mg/dL (ref 0–99)
Triglycerides: 94 mg/dL (ref 0–149)
VLDL Cholesterol Cal: 17 mg/dL (ref 5–40)

## 2023-12-16 LAB — COMPREHENSIVE METABOLIC PANEL
ALT: 9 [IU]/L (ref 0–32)
AST: 17 [IU]/L (ref 0–40)
Albumin: 4.3 g/dL (ref 3.9–4.9)
Alkaline Phosphatase: 77 [IU]/L (ref 44–121)
BUN/Creatinine Ratio: 16 (ref 12–28)
BUN: 9 mg/dL (ref 8–27)
Bilirubin Total: 0.4 mg/dL (ref 0.0–1.2)
CO2: 27 mmol/L (ref 20–29)
Calcium: 9 mg/dL (ref 8.7–10.3)
Chloride: 93 mmol/L — ABNORMAL LOW (ref 96–106)
Creatinine, Ser: 0.55 mg/dL — ABNORMAL LOW (ref 0.57–1.00)
Globulin, Total: 2.3 g/dL (ref 1.5–4.5)
Glucose: 88 mg/dL (ref 70–99)
Potassium: 4.3 mmol/L (ref 3.5–5.2)
Sodium: 133 mmol/L — ABNORMAL LOW (ref 134–144)
Total Protein: 6.6 g/dL (ref 6.0–8.5)
eGFR: 99 mL/min/{1.73_m2} (ref >59)

## 2023-12-16 LAB — TSH+T4F+T3FREE
Free T4: 1.43 ng/dL (ref 0.82–1.77)
T3, Free: 3 pg/mL (ref 2.0–4.4)
TSH: 3.63 u[IU]/mL (ref 0.450–4.500)

## 2024-01-05 ENCOUNTER — Other Ambulatory Visit: Payer: Self-pay | Admitting: Cardiology

## 2024-01-05 DIAGNOSIS — I4892 Unspecified atrial flutter: Secondary | ICD-10-CM

## 2024-01-05 DIAGNOSIS — I480 Paroxysmal atrial fibrillation: Secondary | ICD-10-CM

## 2024-01-05 DIAGNOSIS — I25118 Atherosclerotic heart disease of native coronary artery with other forms of angina pectoris: Secondary | ICD-10-CM

## 2024-01-05 NOTE — Telephone Encounter (Signed)
 Prescription refill request for Xarelto received.  Indication: Afib/Aflutter Last office visit: 12/15/23 [facility])  Weight: 56kg Age: 68 Scr: 0.55 (12/15/23)  CrCl: 85.16ml/min  Appropriate dose. Refill sent.

## 2024-02-08 ENCOUNTER — Encounter: Payer: Self-pay | Admitting: Cardiology

## 2024-02-08 ENCOUNTER — Ambulatory Visit: Payer: PPO | Attending: Cardiology | Admitting: Cardiology

## 2024-02-08 VITALS — BP 126/82 | HR 57 | Ht 60.0 in | Wt 122.2 lb

## 2024-02-08 DIAGNOSIS — I2510 Atherosclerotic heart disease of native coronary artery without angina pectoris: Secondary | ICD-10-CM

## 2024-02-08 DIAGNOSIS — I4819 Other persistent atrial fibrillation: Secondary | ICD-10-CM

## 2024-02-08 DIAGNOSIS — I10 Essential (primary) hypertension: Secondary | ICD-10-CM

## 2024-02-08 DIAGNOSIS — Z79899 Other long term (current) drug therapy: Secondary | ICD-10-CM | POA: Diagnosis not present

## 2024-02-08 DIAGNOSIS — D6869 Other thrombophilia: Secondary | ICD-10-CM

## 2024-02-08 DIAGNOSIS — I484 Atypical atrial flutter: Secondary | ICD-10-CM

## 2024-02-08 DIAGNOSIS — Z01812 Encounter for preprocedural laboratory examination: Secondary | ICD-10-CM

## 2024-02-08 NOTE — Patient Instructions (Signed)
 Medication Instructions:  Your physician recommends that you continue on your current medications as directed. Please refer to the Current Medication list given to you today.  *If you need a refill on your cardiac medications before your next appointment, please call your pharmacy*   Lab Work: Pre procedure labs -- we will call you to schedule:  BMP & CBC  If you have a lab test that is abnormal and we need to change your treatment, we will call you to review the results -- otherwise no news is good news.    Testing/Procedures: Your physician has requested that you have cardiac CT 1 month PRIOR to your ablation. Cardiac computed tomography (CT) is a painless test that uses an x-ray machine to take clear, detailed pictures of your heart. We will contact you if the result is abnormal. We will call you to schedule.  Your physician has recommended that you have an ablation. Catheter ablation is a medical procedure used to treat some cardiac arrhythmias (irregular heartbeats). During catheter ablation, a long, thin, flexible tube is put into a blood vessel in your groin (upper thigh), or neck. This tube is called an ablation catheter. It is then guided to your heart through the blood vessel. Radio frequency waves destroy small areas of heart tissue where abnormal heartbeats may cause an arrhythmia to start.   Your ablation is scheduled for 05/06/2024. Please arrive at [facility] at 8:00 am.  We will call/send instructions at a later date.   Follow-Up: At [facility], you and your health needs are our priority.  As part of our continuing mission to provide you with exceptional heart care, we have created designated Provider Care Teams.  These Care Teams include your primary Cardiologist (physician) and Advanced Practice Providers (APPs -  Physician Assistants and Nurse Practitioners) who all work together to provide you with the care you need, when you need it.  Your next appointment:    1 month(s) after your ablation  The format for your next appointment:   In Person  Provider:   AFib clinic   Thank you for choosing [facility]!!   Reece Cane, RN [phone]    Other Instructions   Cardiac Ablation Cardiac ablation is a procedure to destroy (ablate) some heart tissue that is sending bad signals. These bad signals cause problems in heart rhythm. The heart has many areas that make these signals. If there are problems in these areas, they can make the heart beat in a way that is not normal. Destroying some tissues can help make the heart rhythm normal. Tell your doctor about: Any allergies you have. All medicines you are taking. These include vitamins, herbs, eye drops, creams, and over-the-counter medicines. Any problems you or family members have had with medicines that make you fall asleep (anesthetics). Any blood disorders you have. Any surgeries you have had. Any medical conditions you have, such as kidney failure. Whether you are pregnant or may be pregnant. What are the risks? This is a safe procedure. But problems may occur, including: Infection. Bruising and bleeding. Bleeding into the chest. Stroke or blood clots. Damage to nearby areas of your body. Allergies to medicines or dyes. The need for a pacemaker if the normal system is damaged. Failure of the procedure to treat the problem. What happens before the procedure? Medicines Ask your doctor about: Changing or stopping your normal medicines. This is important. Taking aspirin and ibuprofen. Do not take these medicines unless your doctor tells you  to take them. Taking other medicines, vitamins, herbs, and supplements. General instructions Follow instructions from your doctor about what you cannot eat or drink. Plan to have someone take you home from the hospital or clinic. If you will be going home right after the procedure, plan to have someone with you for 24 hours. Ask your  doctor what steps will be taken to prevent infection. What happens during the procedure?  An IV tube will be put into one of your veins. You will be given a medicine to help you relax. The skin on your neck or groin will be numbed. A cut (incision) will be made in your neck or groin. A needle will be put through your cut and into a large vein. A tube (catheter) will be put into the needle. The tube will be moved to your heart. Dye may be put through the tube. This helps your doctor see your heart. Small devices (electrodes) on the tube will send out signals. A type of energy will be used to destroy some heart tissue. The tube will be taken out. Pressure will be held on your cut. This helps stop bleeding. A bandage will be put over your cut. The exact procedure may vary among doctors and hospitals. What happens after the procedure? You will be watched until you leave the hospital or clinic. This includes checking your heart rate, breathing rate, oxygen , and blood pressure. Your cut will be watched for bleeding. You will need to lie still for a few hours. Do not drive for 24 hours or as long as your doctor tells you. Summary Cardiac ablation is a procedure to destroy some heart tissue. This is done to treat heart rhythm problems. Tell your doctor about any medical conditions you may have. Tell him or her about all medicines you are taking to treat them. This is a safe procedure. But problems may occur. These include infection, bruising, bleeding, and damage to nearby areas of your body. Follow what your doctor tells you about food and drink. You may also be told to change or stop some of your medicines. After the procedure, do not drive for 24 hours or as long as your doctor tells you. This information is not intended to replace advice given to you by your health care provider. Make sure you discuss any questions you have with your health care provider. Document Revised: 12/27/2021 Document  Reviewed: 09/08/2019 Elsevier Patient Education  2023 [employer] Inc[employer]   Cardiac Ablation, Care After  This sheet gives you information about how to care for yourself after your procedure. Your health care provider may also give you more specific instructions. If you have problems or questions, contact your health care provider. What can I expect after the procedure? After the procedure, it is common to have: Bruising around your puncture site. Tenderness around your puncture site. Skipped heartbeats. If you had an atrial fibrillation ablation, you may have atrial fibrillation during the first several months after your procedure.  Tiredness (fatigue).  Follow these instructions at home: Puncture site care  Follow instructions from your health care provider about how to take care of your puncture site. Make sure you: If present, leave stitches (sutures), skin glue, or adhesive strips in place. These skin closures may need to stay in place for up to 2 weeks. If adhesive strip edges start to loosen and curl up, you may trim the loose edges. Do not remove adhesive strips completely unless your health care provider tells you to do  that. If a large square bandage is present, this may be removed 24 hours after surgery.  Check your puncture site every day for signs of infection. Check for: Redness, swelling, or pain. Fluid or blood. If your puncture site starts to bleed, lie down on your back, apply firm pressure to the area, and contact your health care provider. Warmth. Pus or a bad smell. A pea or small marble sized lump at the site is normal and can take up to three months to resolve.  Driving Do not drive for at least 4 days after your procedure or however long your health care provider recommends. (Do not resume driving if you have previously been instructed not to drive for other health reasons.) Do not drive or use heavy machinery while taking prescription pain medicine. Activity Avoid  activities that take a lot of effort for at least 7 days after your procedure. Do not lift anything that is heavier than 5 lb (4.5 kg) for one week.  No sexual activity for 1 week.  Return to your normal activities as told by your health care provider. Ask your health care provider what activities are safe for you. General instructions Take over-the-counter and prescription medicines only as told by your health care provider. Do not use any products that contain nicotine or tobacco, such as cigarettes and e-cigarettes. If you need help quitting, ask your health care provider. You may shower after 24 hours, but Do not take baths, swim, or use a hot tub for 1 week.  Do not drink alcohol for 24 hours after your procedure. Keep all follow-up visits as told by your health care provider. This is important. Contact a health care provider if: You have redness, mild swelling, or pain around your puncture site. You have fluid or blood coming from your puncture site that stops after applying firm pressure to the area. Your puncture site feels warm to the touch. You have pus or a bad smell coming from your puncture site. You have a fever. You have chest pain or discomfort that spreads to your neck, jaw, or arm. You have chest pain that is worse with lying on your back or taking a deep breath. You are sweating a lot. You feel nauseous. You have a fast or irregular heartbeat. You have shortness of breath. You are dizzy or light-headed and feel the need to lie down. You have pain or numbness in the arm or leg closest to your puncture site. Get help right away if: Your puncture site suddenly swells. Your puncture site is bleeding and the bleeding does not stop after applying firm pressure to the area. These symptoms may represent a serious problem that is an emergency. Do not wait to see if the symptoms will go away. Get medical help right away. Call your local emergency services (911 in the [place] Do not  drive yourself to the hospital. Summary After the procedure, it is normal to have bruising and tenderness at the puncture site in your groin, neck, or forearm. Check your puncture site every day for signs of infection. Get help right away if your puncture site is bleeding and the bleeding does not stop after applying firm pressure to the area. This is a medical emergency. This information is not intended to replace advice given to you by your health care provider. Make sure you discuss any questions you have with your health care provider.

## 2024-02-08 NOTE — Progress Notes (Signed)
 Electrophysiology Office Note:   Date:  02/08/2024  ID:  Jennifer Martin, DOB 1955-12-15, MRN [id number]  Primary Cardiologist: Zoe Hinds, MD Primary Heart Failure: None Electrophysiologist: Estell Dillinger Cortland Ding, MD      History of Present Illness:   Jennifer Martin is a 68 y.o. female with h/o coronary artery disease, hypertension, hyperlipidemia, atrial fibrillation/flutter seen today for routine electrophysiology followup.   Since last being seen in our clinic the patient reports significant fatigue and weakness.  She had COVID a few months ago and went into atrial fibrillation.  She has since been started on amiodarone .  She is in normal rhythm today.  She did require cardioversion.  She notes intermittent palpitations and leg weakness.  She would prefer amiodarone  be a short-term medication.  she denies chest pain, palpitations, dyspnea, PND, orthopnea, nausea, vomiting, dizziness, syncope, edema, weight gain, or early satiety.   Review of systems complete and found to be negative unless listed in HPI.   EP Information / Studies Reviewed:    EKG is ordered today. Personal review as below.    Sinus rhythm, 1dAVB    Risk Assessment/Calculations:    CHA2DS2-VASc Score = 5   This indicates a 7.2% annual risk of stroke. The patient's score is based upon: CHF History: 1 HTN History: 1 Diabetes History: 0 Stroke History: 0 Vascular Disease History: 1 Age Score: 1 Gender Score: 1             Physical Exam:   VS:  BP 126/82   Pulse (!) 57   Ht 5' (1.524 m)   Wt 122 lb 3.2 oz (55.4 kg)   SpO2 96%   BMI 23.87 kg/m    Wt Readings from Last 3 Encounters:  02/08/24 122 lb 3.2 oz (55.4 kg)  12/15/23 123 lb 7.2 oz (56 kg)  09/25/23 122 lb (55.3 kg)     GEN: Well nourished, well developed in no acute distress NECK: No JVD; No carotid bruits CARDIAC: Regular rate and rhythm, no murmurs, rubs, gallops RESPIRATORY:  Clear to auscultation without rales, wheezing or rhonchi   ABDOMEN: Soft, non-tender, non-distended EXTREMITIES:  No edema; No deformity   ASSESSMENT AND PLAN:    1.  Paroxysmal atrial fibrillation/atypical flutter: Post ablation 05/29/2017.  She has had recurrence of atypical atrial flutter.  She had cardioversion and is now on amiodarone .  She continues to feel poorly and would prefer amiodarone  be a short-term medication.  Due to that, we Quintana Canelo plan for ablation.  Risks and benefits have been discussed.  She understands risks and is agreed to the procedure.  Risk, benefits, and alternatives to EP study and radiofrequency/pulse field ablation for afib were also discussed in detail today. These risks include but are not limited to stroke, bleeding, vascular damage, tamponade, perforation, damage to the esophagus, lungs, and other structures, pulmonary vein stenosis, worsening renal function, and death. The patient understands these risk and wishes to proceed.  We Josha Weekley therefore proceed with catheter ablation at the next available time.  Carto, ICE, anesthesia are requested for the procedure.  Venola Castello also obtain CT PV protocol prior to the procedure to exclude LAA thrombus and further evaluate atrial anatomy.  2.  Secondary hypercoagulable state: Currently on Eliquis for atrial fibrillation  3.  High risk medication monitoring: Currently on amiodarone .  TSH and LFTs stable.  4.  Hypertension: Well-controlled  5.  Coronary artery disease: No current chest pain.  Continue Plavix  per primary cardiology   Follow up with  Afib Clinic as usual post procedure  Signed, Mackynzie Woolford Cortland Ding, MD

## 2024-03-02 ENCOUNTER — Other Ambulatory Visit: Payer: Self-pay | Admitting: Cardiology

## 2024-03-16 ENCOUNTER — Ambulatory Visit (HOSPITAL_BASED_OUTPATIENT_CLINIC_OR_DEPARTMENT_OTHER)
Admit: 2024-03-16 | Discharge: 2024-03-16 | Disposition: A | Discharge status: Home / Self Care | Attending: Family Medicine | Admitting: Radiology

## 2024-03-16 ENCOUNTER — Encounter (HOSPITAL_BASED_OUTPATIENT_CLINIC_OR_DEPARTMENT_OTHER): Payer: Self-pay

## 2024-03-16 ENCOUNTER — Ambulatory Visit (HOSPITAL_BASED_OUTPATIENT_CLINIC_OR_DEPARTMENT_OTHER): Payer: Self-pay | Admitting: Family Medicine

## 2024-03-16 ENCOUNTER — Ambulatory Visit (HOSPITAL_BASED_OUTPATIENT_CLINIC_OR_DEPARTMENT_OTHER)
Admission: EM | Admit: 2024-03-16 | Discharge: 2024-03-16 | Disposition: A | Discharge status: Home / Self Care | Attending: Family Medicine | Admitting: Family Medicine

## 2024-03-16 DIAGNOSIS — M7742 Metatarsalgia, left foot: Secondary | ICD-10-CM | POA: Diagnosis not present

## 2024-03-16 DIAGNOSIS — M25572 Pain in left ankle and joints of left foot: Secondary | ICD-10-CM

## 2024-03-16 MED ORDER — PREDNISONE 20 MG PO TABS: 40.0000 mg | ORAL_TABLET | Freq: Every day | ORAL | 0 refills | Status: AC

## 2024-03-16 NOTE — ED Triage Notes (Signed)
 Pain to left foot onset 8 days ago. States small amount of swelling. Points to pain to left lateral foot below 3rd and 4th toes. States unsure of exact injury but only that pain started.

## 2024-03-16 NOTE — Discharge Instructions (Addendum)
 I am not seeing anything concerning on the x ray. Most metatarsalgia of the foot. This is treated with compression of the foot, ice and elevation and rest.  I am giving you a prednisone  pack to take over the next 5 days to hopefully help with the pain and inflammation. Take this with food.  If symptoms continue or worsen you will need to follow-up with orthopedic.

## 2024-03-16 NOTE — ED Provider Notes (Signed)
 Jennifer Martin CARE    CSN: [id number] Arrival date & time: 03/16/24  1126      History   Chief Complaint Chief Complaint  Patient presents with   Foot Pain    HPI Jennifer Martin is a 68 y.o. female.   Patient is a 68 year old female who presents today with left foot pain.  This started approximately 8 days ago and has not improved.  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  The pain is located to the metatarsal area laterally below the 3rd and 4th toes.  There is some mild erythema to the area.  Denies any specific injuries to the foot.  Denies any numbness, tingling in the foot.   Foot Pain    Past Medical History:  Diagnosis Date   Acute pulmonary edema (HCC)    Adrenal insufficiency (Addison's disease) (HCC) 04/14/2022   Possible. Saw Endocrine.     Anxiety    Aortic regurgitation 06/17/2017   Mild to moderate    Atherosclerosis of coronary artery of native heart with angina pectoris [facility])    Atrial flutter (HCC)    CAD (coronary artery disease)    Calculus of gallbladder without cholecystitis without obstruction 03/22/2020   CHF (congestive heart failure) (HCC)    Chronic anticoagulation 04/13/2017   Chronic combined systolic and diastolic heart failure (HCC) 04/13/2017   varying EF in the past - 45-50% in 09/2016, 37% by echo 08/2016,  61% by nuc 01/2017 then 30-35% this admission (40% by review from Dr. Micael Adas)   Chronic obstructive pulmonary disease (HCC) 10/10/2016   COPD (chronic obstructive pulmonary disease) (HCC)    Coronary artery disease involving native coronary artery of native heart with angina pectoris (HCC) 03/25/2017   Overview:  CTO of LCF Lexiscan MPS 07/28/13 with no ischemia, EF 50%   Coronary atherosclerosis of native coronary artery 06/28/2015   CTO of LCF  Lexiscan MPS 07/28/13 with no ischemia, EF 50%   CTO of  LCF  Lexiscan MPS 07/28/13 with no ischemia, EF 50%     Degenerative arthritis of cervical spine with nerve compression 01/28/2022   Demand ischemia (HCC)    Depression    Difficult intubation    " small airway and need a little tube "   Diverticulosis    Diverticulosis of colon 05/08/2020   Fibrocystic breast changes of both breasts 02/04/2016   Last Assessment & Plan:   She follows with Dr. Jonita Neth for this     GERD (gastroesophageal reflux disease)    Gout 05/12/2016   Last Assessment & Plan:  She needs her uric acid level checked    High risk medication use 05/12/2016   History of atrial fibrillation 06/28/2015   Ablation 2018 follows with EP provider on xarelto   Last Assessment & Plan:  This is stable for her Last Assessment & Plan:  She has not had any episodes of this stable   Hypercoagulable state due to persistent atrial fibrillation (HCC) 09/22/2023   Hyperlipidemia    Hypertension    Hypertensive heart disease with heart failure (HCC) 03/25/2017   Inappropriate ADH syndrome (HCC)    Inguinal hernia 05/12/2016   Inverted nipple 11/10/2022   Right.  Chronic.     LBBB (left bundle branch block)    Left bundle branch block (LBBB) 06/28/2015   Lipoma of skin and subcutaneous  tissue of trunk 06/09/2023   Malaise and fatigue 05/12/2016   Last Assessment & Plan:  Update her TSh for her and her CBC, she is advised to stop smoking    Melasma 11/10/2022   Migraine    "none since ~ 2012; mild ones then when I did have them because of the beta blockers I was on" (06/09/2017)   Mild episode of recurrent major depressive disorder (HCC) 09/27/2016   Mitral regurgitation 06/17/2017   Mild    Mixed hyperlipidemia 06/28/2015   Last Assessment & Plan:    Reviewed with her the last lipids done through cardiology     Mobitz type 1 second degree AV block    Moderate episode of recurrent major depressive disorder (HCC) 09/27/2016   Myocardial infarction (HCC)    "I've had light ones"  (06/09/2017)   Near syncope    On amiodarone  therapy 04/13/2017   Osteoarthritis    Palpitation 01/13/2018   Paroxysmal atrial fibrillation (HCC) 05/29/2017   Pneumonia    "couple times" (06/09/2017)   Preoperative cardiovascular examination 10/01/2018   Primary osteoarthritis involving multiple joints 05/12/2016   Last Assessment & Plan:   This is stable for her at this time     SIADH (syndrome of inappropriate ADH production) (HCC) 05/12/2016   Last Assessment & Plan:    Her sodium is stable for her     SOB (shortness of breath) on exertion    Statin intolerance 09/22/2019   Statin myopathy 09/27/2019   Tobacco dependence 09/30/2018   V-tach (HCC) 06/28/2015    Patient Active Problem List   Diagnosis Date Noted   Hypercoagulable state due to persistent atrial fibrillation (HCC) 09/22/2023   Lipoma of skin and subcutaneous tissue of trunk 06/09/2023   Inverted nipple 11/10/2022   Melasma 11/10/2022   Adrenal insufficiency (Addison's disease) (HCC) 04/14/2022   Degenerative arthritis of cervical spine with nerve compression 01/28/2022   Pneumonia    Osteoarthritis    Myocardial infarction (HCC)    Migraine    LBBB (left bundle branch block)    Hypertension    GERD (gastroesophageal reflux disease)    Diverticulosis    Difficult intubation    Depression    Demand ischemia (HCC)    COPD (chronic obstructive pulmonary disease) (HCC)    CHF (congestive heart failure) (HCC)    CAD (coronary artery disease)    Atherosclerosis of coronary artery of native heart with angina pectoris (HCC)    Diverticulosis of colon 05/08/2020   Calculus of gallbladder without cholecystitis without obstruction 03/22/2020   Statin myopathy 09/27/2019   Statin intolerance 09/22/2019   Preoperative cardiovascular examination 10/01/2018   Tobacco dependence 09/30/2018   Palpitation 01/13/2018   Near syncope    Mitral regurgitation 06/17/2017   Aortic regurgitation 06/17/2017   SOB (shortness of  breath) on exertion    Mobitz type 1 second degree AV block    Acute pulmonary edema (HCC)    Paroxysmal atrial fibrillation (HCC) 05/29/2017   Chronic combined systolic and diastolic heart failure (HCC) 04/13/2017   Chronic anticoagulation 04/13/2017   On amiodarone  therapy 04/13/2017   Atrial flutter (HCC) 03/25/2017   Hyperlipidemia 03/25/2017   Coronary artery disease involving native coronary artery of native heart with angina pectoris (HCC) 03/25/2017   Hypertensive heart disease with heart failure (HCC) 03/25/2017   Chronic obstructive pulmonary disease (HCC) 10/10/2016   Mild episode of recurrent major depressive disorder (HCC) 09/27/2016   Moderate episode of recurrent major depressive disorder (HCC)  09/27/2016   Anxiety 05/12/2016   Gout 05/12/2016   High risk medication use 05/12/2016   Inappropriate ADH syndrome (HCC) 05/12/2016   Malaise and fatigue 05/12/2016   Inguinal hernia 05/12/2016   Primary osteoarthritis involving multiple joints 05/12/2016   SIADH (syndrome of inappropriate ADH production) (HCC) 05/12/2016   Fibrocystic breast changes of both breasts 02/04/2016   Left bundle branch block (LBBB) 06/28/2015   V-tach (HCC) 06/28/2015   History of atrial fibrillation 06/28/2015   Coronary atherosclerosis of native coronary artery 06/28/2015   Mixed hyperlipidemia 06/28/2015    Past Surgical History:  Procedure Laterality Date   ANTERIOR CERVICAL DISCECTOMY  03/10/2022   Done at [facility]   ATRIAL FIBRILLATION ABLATION N/A 05/29/2017   Procedure: Atrial Fibrillation Ablation;  Surgeon: Lei Pump, MD;  Location: [facility] INVASIVE CV LAB;  Service: Cardiovascular;  Laterality: N/A;   BREAST SURGERY Left    "took a gland out; milk duct"   CARDIAC CATHETERIZATION  2008   has had 2 procedures, the last one approx 2008, never had PCI/stent. Dr Sandee Crook   CARDIOVERSION N/A 09/25/2023   Procedure: CARDIOVERSION;  Surgeon: Luana Rumple, MD;  Location: [facility] INVASIVE CV  LAB;  Service: Cardiovascular;  Laterality: N/A;   CARPAL TUNNEL RELEASE Left    DILATION AND CURETTAGE OF UTERUS     "related to heavy bleeding"   INGUINAL HERNIA REPAIR Left    KNEE SURGERY Left 10/2018   LACRIMAL DUCT EXPLORATION Right 04/01/2018   Procedure: LACRIMAL DUCT EXPLORATION AND ETHMOIDECTOMY;  Surgeon: Karan Osgood, MD;  Location: [facility] OR;  Service: Ophthalmology;  Laterality: Right;   SHOULDER ARTHROSCOPY WITH ROTATOR CUFF REPAIR Right    TEAR DUCT PROBING Right 04/01/2018   Procedure: TEAR DUCT PROBING WITH STENT;  Surgeon: Karan Osgood, MD;  Location: [facility] OR;  Service: Ophthalmology;  Laterality: Right;   TRACHEOSTOMY  2006   "closed on it's own"   TUBAL LIGATION     VAGINAL HYSTERECTOMY     "fibroids"    OB History   No obstetric history on file.      Home Medications    Prior to Admission medications   Medication Sig Start Date End Date Taking? Authorizing Provider  predniSONE  (DELTASONE ) 20 MG tablet Take 2 tablets (40 mg total) by mouth daily with breakfast for 5 days. 03/16/24 03/21/24 Yes Pariss Hommes A, FNP  acetaminophen  (TYLENOL ) 500 MG tablet Take 1,000 mg by mouth 2 (two) times daily as needed for mild pain or headache.    [provider]  albuterol  (PROVENTIL  HFA;VENTOLIN  HFA) 108 (90 Base) MCG/ACT inhaler Inhale 2 puffs into the lungs every 6 (six) hours as needed for wheezing or shortness of breath.    [provider]  amiodarone  (PACERONE ) 200 MG tablet Take 1 tablet (200 mg total) by mouth daily. 09/22/23   Fenton, Clint R, PA  amoxicillin (AMOXIL) 500 MG capsule Take 500 mg by mouth 3 (three) times daily. 02/03/24   [provider]  ascorbic acid (VITAMIN C) 500 MG tablet Take 500 mg by mouth daily.    [provider]  Budeson-Glycopyrrol-Formoterol  (BREZTRI AEROSPHERE) 160-9-4.8 MCG/ACT AERO Inhale 1 puff into the lungs in the morning and at bedtime.    [provider]  Calcium  Carb-Cholecalciferol  (CALCIUM  + VITAMIN D3 PO) Take 1 tablet by mouth daily.    [provider]  CALCIUM  CITRATE-VITAMIN D3 PO Take 1 tablet by mouth daily. Strength 600mg /500 IU    [provider]  cholecalciferol (VITAMIN D3) 25 MCG (1000 UNIT) tablet Take 1,000 Units by mouth daily.    [provider]  escitalopram  (LEXAPRO ) 20 MG tablet Take 1 tablet (20 mg total) by mouth daily. 09/14/23   Hassan Links, MD  ezetimibe  (ZETIA ) 10 MG tablet Take 1 tablet (10 mg total) by mouth 2 (two) times a week. 09/14/23   Hassan Links, MD  fluticasone  (FLONASE ) 50 MCG/ACT nasal spray Place 2 sprays into both nostrils daily as needed for allergies or rhinitis.    [provider]  furosemide  (LASIX ) 20 MG tablet Take 1 tablet (20 mg total) by mouth 2 (two) times daily. Take only one daily if you weigh 124lbs or less. 12/01/23   Hassan Links, MD  levalbuterol (XOPENEX) 1.25 MG/3ML nebulizer solution Take 1.25 mg by nebulization every 4 (four) hours as needed for wheezing or shortness of breath.    [provider]  LORazepam  (ATIVAN ) 1 MG tablet Take 1 mg by mouth 3 (three) times daily as needed for anxiety.     [provider]  MAGNESIUM PO Take 1 capsule by mouth daily.    [provider]  metoprolol  succinate (TOPROL -XL) 25 MG 24 hr tablet TAKE ONE TABLET BY MOUTH DAILY 03/02/24   Hassan Links, MD  nitroGLYCERIN  (NITROSTAT ) 0.4 MG SL tablet Place 0.4 mg under the tongue every 5 (five) minutes as needed for chest pain.    [provider]  pantoprazole  (PROTONIX ) 40 MG tablet Take 40 mg by mouth daily.    [provider]  potassium chloride  (KLOR-CON ) 10 MEQ tablet TAKE ONE TABLET BY MOUTH TWICE DAILY 10/19/23   Hassan Links, MD  sacubitril -valsartan  (ENTRESTO ) 24-26 MG Take 1 tablet by mouth 2 (two) times daily. 10/05/23   Hassan Links, MD  traMADol (ULTRAM) 50 MG tablet Take 50-100 mg by mouth every 6 (six) hours as needed for moderate  pain. 11/21/19   [provider]  XARELTO  20 MG TABS tablet TAKE ONE TABLET BY MOUTH ONCE DAILY 01/05/24   Hassan Links, MD  zinc gluconate 50 MG tablet Take 50 mg by mouth daily.    [provider]    Family History Family History  Problem Relation Age of Onset   CAD Brother    Heart disease Brother     Social History Social History   Tobacco Use   Smoking status: Every Day    Current packs/day: 0.50    Average packs/day: 0.5 packs/day for 34.0 years (17.0 ttl pk-yrs)    Types: Cigarettes   Smokeless tobacco: Never   Tobacco comments:    Currently smoker 1/2ppd 09/22/23  Vaping Use   Vaping status: Former   Quit date: 03/23/2004  Substance Use Topics   Alcohol use: No   Drug use: No     Allergies   Atorvastatin , Cefdinir, and Levofloxacin   Review of Systems Review of Systems  See HPI Physical Exam Triage Vital Signs ED Triage Vitals  Encounter Vitals Group     BP 03/16/24 1148 123/74     Systolic BP Percentile --      Diastolic BP Percentile --      Pulse Rate 03/16/24 1148 61     Resp 03/16/24 1148 20     Temp 03/16/24 1148 98.5 F (36.9 C)     Temp Source 03/16/24 1148 Oral     SpO2 03/16/24 1148 97 %     Weight --  Height --      Head Circumference --      Peak Flow --      Pain Score 03/16/24 1150 0     Pain Loc --      Pain Education --      Exclude from Growth Chart --    No data found.  Updated Vital Signs BP 123/74 (BP Location: Right Arm)   Pulse 61   Temp 98.5 F (36.9 C) (Oral)   Resp 20   SpO2 97%   Visual Acuity Right Eye Distance:   Left Eye Distance:   Bilateral Distance:    Right Eye Near:   Left Eye Near:    Bilateral Near:     Physical Exam Vitals and nursing note reviewed.  Constitutional:      General: She is not in acute distress.    Appearance: Normal appearance. She is not ill-appearing, toxic-appearing or diaphoretic.  Pulmonary:     Effort: Pulmonary effort is normal.   Musculoskeletal:        General: Swelling and tenderness present.       Feet:  Neurological:     Mental Status: She is alert.  Psychiatric:        Mood and Affect: Mood normal.      UC Treatments / Results  Labs (all labs ordered are listed, but only abnormal results are displayed) Labs Reviewed - No data to display  EKG   Radiology No results found.  Procedures Procedures (including critical care time)  Medications Ordered in UC Medications - No data to display  Initial Impression / Assessment and Plan / UC Course  I have reviewed the triage vital signs and the nursing notes.  Pertinent labs & imaging results that were available during my care of the patient were reviewed by me and considered in my medical decision making (see chart for details).     Metatarsalgia-no concerns on x-ray for any acute fracture.  Most likely some metatarsalgia or tendinitis. Recommend rest, ice, elevate and I am prescribing prednisone  pack to take over the next 5 days to help with pain and inflammation.  Recommend take this with food earlier in the day. If symptoms worsen was recommended to follow-up with orthopedic Final Clinical Impressions(s) / UC Diagnoses   Final diagnoses:  Pain of joint of left ankle and foot  Metatarsalgia of left foot     Discharge Instructions      I am not seeing anything concerning on the x ray. Most metatarsalgia of the foot. This is treated with compression of the foot, ice and elevation and rest.  I am giving you a prednisone  pack to take over the next 5 days to hopefully help with the pain and inflammation. Take this with food.  If symptoms continue or worsen you will need to follow-up with orthopedic.  ED Prescriptions     Medication Sig Dispense Auth. Provider   predniSONE  (DELTASONE ) 20 MG tablet Take 2 tablets (40 mg total) by mouth daily with breakfast for 5 days. 10 tablet Landa Pine, FNP      PDMP not reviewed this encounter.    Landa Pine, FNP 03/16/24 1306

## 2024-03-21 ENCOUNTER — Other Ambulatory Visit: Payer: Self-pay

## 2024-03-21 DIAGNOSIS — I4819 Other persistent atrial fibrillation: Secondary | ICD-10-CM

## 2024-03-21 DIAGNOSIS — Z01812 Encounter for preprocedural laboratory examination: Secondary | ICD-10-CM

## 2024-04-04 ENCOUNTER — Encounter (HOSPITAL_COMMUNITY): Payer: Self-pay

## 2024-04-15 ENCOUNTER — Ambulatory Visit (HOSPITAL_COMMUNITY)
Admission: RE | Admit: 2024-04-15 | Discharge: 2024-04-15 | Disposition: A | Discharge status: Home / Self Care | Source: Ambulatory Visit | Attending: Cardiology

## 2024-04-15 DIAGNOSIS — M47814 Spondylosis without myelopathy or radiculopathy, thoracic region: Secondary | ICD-10-CM | POA: Diagnosis not present

## 2024-04-15 DIAGNOSIS — J449 Chronic obstructive pulmonary disease, unspecified: Secondary | ICD-10-CM | POA: Insufficient documentation

## 2024-04-15 DIAGNOSIS — I4819 Other persistent atrial fibrillation: Secondary | ICD-10-CM | POA: Diagnosis present

## 2024-04-15 DIAGNOSIS — J948 Other specified pleural conditions: Secondary | ICD-10-CM | POA: Insufficient documentation

## 2024-04-15 MED ORDER — IOHEXOL 350 MG/ML SOLN
100.0000 mL | Freq: Once | INTRAVENOUS | Status: AC | PRN
  Administered 2024-04-15: 100 mL via INTRAVENOUS

## 2024-04-16 LAB — CBC
Hematocrit: 40 % (ref 34.0–46.6)
Hemoglobin: 13.3 g/dL (ref 11.1–15.9)
MCH: 31.5 pg (ref 26.6–33.0)
MCHC: 33.3 g/dL (ref 31.5–35.7)
MCV: 95 fL (ref 79–97)
Platelets: 338 10*3/uL (ref 150–450)
RBC: 4.22 x10E6/uL (ref 3.77–5.28)
RDW: 12.1 % (ref 11.7–15.4)
WBC: 6 10*3/uL (ref 3.4–10.8)

## 2024-04-16 LAB — BASIC METABOLIC PANEL WITH GFR
BUN/Creatinine Ratio: 15 (ref 12–28)
BUN: 8 mg/dL (ref 8–27)
CO2: 22 mmol/L (ref 20–29)
Calcium: 9.2 mg/dL (ref 8.7–10.3)
Chloride: 94 mmol/L — ABNORMAL LOW (ref 96–106)
Creatinine, Ser: 0.52 mg/dL — ABNORMAL LOW (ref 0.57–1.00)
Glucose: 81 mg/dL (ref 70–99)
Potassium: 4.8 mmol/L (ref 3.5–5.2)
Sodium: 132 mmol/L — ABNORMAL LOW (ref 134–144)
eGFR: 101 mL/min/{1.73_m2} (ref >59)

## 2024-04-18 ENCOUNTER — Telehealth: Payer: Self-pay

## 2024-04-18 NOTE — Telephone Encounter (Signed)
 Pt aware of her Afib Ablation with Dr. Inocencio being moved from 7/18 to 7/15 - she will arrive at 8 am for a 10  am procedure time.

## 2024-04-25 ENCOUNTER — Ambulatory Visit: Payer: Self-pay | Admitting: Cardiology

## 2024-04-26 ENCOUNTER — Telehealth (HOSPITAL_COMMUNITY): Payer: Self-pay

## 2024-04-26 NOTE — Telephone Encounter (Signed)
Pt calling in for results

## 2024-04-26 NOTE — Telephone Encounter (Signed)
 Spoke with patient to discuss upcoming procedure.   CT: completed.  Labs: completed.   Any recent signs of acute illness or been started on antibiotics? No Any new medications started? No Any medications to hold? No  Any missed doses of blood thinner? No Advised patient to continue taking ANTICOAGULANT: Xarelto  (Rivaroxaban ) daily without missing any doses.  Medication instructions:  On the morning of your procedure DO NOT take any medication., including Xarelto  or the procedure may be rescheduled. Nothing to eat or drink after midnight prior to your procedure.  Confirmed patient is scheduled for Atrial Fibrillation Ablation on Tuesday, July 15 with Dr. Soyla Norton. Instructed patient to arrive at the Main Entrance A at [facility]: [address] [place], [place] [address] and check in at Admitting at 8:00 AM.  Advised of plan to go home the same day and will only stay overnight if medically necessary. You MUST have a responsible adult to drive you home and MUST be with you the first 24 hours after you arrive home or your procedure could be cancelled.  Patient verbalized understanding to all instructions provided and agreed to proceed with procedure.

## 2024-04-27 ENCOUNTER — Other Ambulatory Visit: Payer: Self-pay | Admitting: Cardiology

## 2024-05-02 NOTE — Pre-Procedure Instructions (Signed)
Instructed patient on the following items: Arrival time 0800 Nothing to eat or drink after midnight No meds AM of procedure Responsible person to drive you home and stay with you for 24 hrs  Have you missed any doses of anti-coagulant Xarelto- takes once a day, hasn't missed any doses

## 2024-05-03 ENCOUNTER — Other Ambulatory Visit: Payer: Self-pay

## 2024-05-03 ENCOUNTER — Ambulatory Visit (HOSPITAL_COMMUNITY)
Admission: RE | Disposition: A | Discharge status: Home / Self Care | Payer: Self-pay | Source: Home / Self Care | Attending: Cardiology

## 2024-05-03 ENCOUNTER — Ambulatory Visit (HOSPITAL_BASED_OUTPATIENT_CLINIC_OR_DEPARTMENT_OTHER): Payer: Self-pay | Admitting: Registered Nurse

## 2024-05-03 ENCOUNTER — Ambulatory Visit (HOSPITAL_COMMUNITY): Payer: Self-pay | Admitting: Registered Nurse

## 2024-05-03 ENCOUNTER — Ambulatory Visit (HOSPITAL_COMMUNITY)
Admission: RE | Admit: 2024-05-03 | Discharge: 2024-05-03 | Disposition: A | Discharge status: Home / Self Care | Attending: Cardiology | Admitting: Cardiology

## 2024-05-03 ENCOUNTER — Other Ambulatory Visit (HOSPITAL_COMMUNITY): Payer: Self-pay

## 2024-05-03 DIAGNOSIS — F17200 Nicotine dependence, unspecified, uncomplicated: Secondary | ICD-10-CM | POA: Insufficient documentation

## 2024-05-03 DIAGNOSIS — R000 Tachycardia, unspecified: Secondary | ICD-10-CM

## 2024-05-03 DIAGNOSIS — I110 Hypertensive heart disease with heart failure: Secondary | ICD-10-CM | POA: Insufficient documentation

## 2024-05-03 DIAGNOSIS — I5042 Chronic combined systolic (congestive) and diastolic (congestive) heart failure: Secondary | ICD-10-CM | POA: Diagnosis not present

## 2024-05-03 DIAGNOSIS — I2510 Atherosclerotic heart disease of native coronary artery without angina pectoris: Secondary | ICD-10-CM | POA: Diagnosis not present

## 2024-05-03 DIAGNOSIS — I509 Heart failure, unspecified: Secondary | ICD-10-CM | POA: Diagnosis not present

## 2024-05-03 DIAGNOSIS — I4719 Other supraventricular tachycardia: Secondary | ICD-10-CM | POA: Diagnosis not present

## 2024-05-03 DIAGNOSIS — I25119 Atherosclerotic heart disease of native coronary artery with unspecified angina pectoris: Secondary | ICD-10-CM | POA: Diagnosis not present

## 2024-05-03 DIAGNOSIS — I483 Typical atrial flutter: Secondary | ICD-10-CM | POA: Diagnosis not present

## 2024-05-03 DIAGNOSIS — I4891 Unspecified atrial fibrillation: Secondary | ICD-10-CM | POA: Diagnosis not present

## 2024-05-03 DIAGNOSIS — J449 Chronic obstructive pulmonary disease, unspecified: Secondary | ICD-10-CM | POA: Diagnosis not present

## 2024-05-03 DIAGNOSIS — I4819 Other persistent atrial fibrillation: Secondary | ICD-10-CM | POA: Diagnosis present

## 2024-05-03 HISTORY — PX: ATRIAL FIBRILLATION ABLATION: EP1191

## 2024-05-03 LAB — POCT ACTIVATED CLOTTING TIME: Activated Clotting Time: 440 s

## 2024-05-03 SURGERY — ATRIAL FIBRILLATION ABLATION
Anesthesia: General

## 2024-05-03 MED ORDER — SUGAMMADEX SODIUM 200 MG/2ML IV SOLN
INTRAVENOUS | Status: DC | PRN
  Administered 2024-05-03: 300 mg via INTRAVENOUS

## 2024-05-03 MED ORDER — SUCCINYLCHOLINE CHLORIDE 200 MG/10ML IV SOSY
PREFILLED_SYRINGE | INTRAVENOUS | Status: DC | PRN
  Administered 2024-05-03: 80 mg via INTRAVENOUS

## 2024-05-03 MED ORDER — FENTANYL CITRATE (PF) 100 MCG/2ML IJ SOLN
INTRAMUSCULAR | Status: AC
  Filled 2024-05-03: qty 2

## 2024-05-03 MED ORDER — LIDOCAINE 2% (20 MG/ML) 5 ML SYRINGE
INTRAMUSCULAR | Status: DC | PRN
  Administered 2024-05-03: 100 mg via INTRAVENOUS

## 2024-05-03 MED ORDER — ACETAMINOPHEN 325 MG PO TABS: 650.0000 mg | ORAL_TABLET | ORAL | Status: DC | PRN

## 2024-05-03 MED ORDER — PHENYLEPHRINE HCL-NACL 20-0.9 MG/250ML-% IV SOLN
INTRAVENOUS | Status: DC | PRN
  Administered 2024-05-03: 20 ug/min via INTRAVENOUS

## 2024-05-03 MED ORDER — HEPARIN (PORCINE) IN NACL 1000-0.9 UT/500ML-% IV SOLN
INTRAVENOUS | Status: DC | PRN
  Administered 2024-05-03 (×3): 500 mL

## 2024-05-03 MED ORDER — PROTAMINE SULFATE 10 MG/ML IV SOLN
INTRAVENOUS | Status: DC | PRN
  Administered 2024-05-03: 40 mg via INTRAVENOUS

## 2024-05-03 MED ORDER — OXYCODONE HCL 5 MG PO TABS: 5.0000 mg | ORAL_TABLET | Freq: Once | ORAL | Status: DC | PRN

## 2024-05-03 MED ORDER — FENTANYL CITRATE (PF) 100 MCG/2ML IJ SOLN
25.0000 ug | INTRAMUSCULAR | Status: DC | PRN
  Administered 2024-05-03: 50 ug via INTRAVENOUS

## 2024-05-03 MED ORDER — OXYCODONE HCL 5 MG/5ML PO SOLN: 5.0000 mg | Freq: Once | ORAL | Status: DC | PRN

## 2024-05-03 MED ORDER — ONDANSETRON HCL 4 MG/2ML IJ SOLN: 4.0000 mg | Freq: Once | INTRAMUSCULAR | Status: DC | PRN

## 2024-05-03 MED ORDER — EPHEDRINE SULFATE-NACL 50-0.9 MG/10ML-% IV SOSY
PREFILLED_SYRINGE | INTRAVENOUS | Status: DC | PRN
  Administered 2024-05-03: 5 mg via INTRAVENOUS

## 2024-05-03 MED ORDER — HEPARIN SODIUM (PORCINE) 1000 UNIT/ML IJ SOLN
INTRAMUSCULAR | Status: DC | PRN
Start: 2024-05-03 — End: 2024-05-03
  Administered 2024-05-03: 13000 [IU] via INTRAVENOUS

## 2024-05-03 MED ORDER — DEXAMETHASONE SODIUM PHOSPHATE 10 MG/ML IJ SOLN
INTRAMUSCULAR | Status: DC | PRN
  Administered 2024-05-03: 10 mg via INTRAVENOUS

## 2024-05-03 MED ORDER — SODIUM CHLORIDE 0.9 % IV SOLN: INTRAVENOUS | Status: DC

## 2024-05-03 MED ORDER — FENTANYL CITRATE (PF) 100 MCG/2ML IJ SOLN
INTRAMUSCULAR | Status: DC
Start: 2024-05-03 — End: 2024-05-03
  Filled 2024-05-03: qty 2

## 2024-05-03 MED ORDER — ROCURONIUM BROMIDE 10 MG/ML (PF) SYRINGE
PREFILLED_SYRINGE | INTRAVENOUS | Status: DC | PRN
  Administered 2024-05-03: 10 mg via INTRAVENOUS
  Administered 2024-05-03: 50 mg via INTRAVENOUS

## 2024-05-03 MED ORDER — ATROPINE SULFATE 1 MG/10ML IJ SOSY
PREFILLED_SYRINGE | INTRAMUSCULAR | Status: DC | PRN
  Administered 2024-05-03: 1 mg via INTRAVENOUS

## 2024-05-03 MED ORDER — COLCHICINE 0.6 MG PO TABS
0.6000 mg | ORAL_TABLET | Freq: Two times a day (BID) | ORAL | 0 refills | Status: DC
  Filled 2024-05-03: qty 10, 5d supply, fill #0

## 2024-05-03 MED ORDER — PROPOFOL 10 MG/ML IV BOLUS
INTRAVENOUS | Status: DC | PRN
  Administered 2024-05-03: 80 mg via INTRAVENOUS

## 2024-05-03 MED ORDER — ONDANSETRON HCL 4 MG/2ML IJ SOLN: 4.0000 mg | Freq: Four times a day (QID) | INTRAMUSCULAR | Status: DC | PRN

## 2024-05-03 MED ORDER — ONDANSETRON HCL 4 MG/2ML IJ SOLN
INTRAMUSCULAR | Status: DC | PRN
  Administered 2024-05-03: 4 mg via INTRAVENOUS

## 2024-05-03 MED ORDER — COLCHICINE 0.6 MG PO TABS
0.6000 mg | ORAL_TABLET | Freq: Once | ORAL | Status: AC
  Administered 2024-05-03: 0.6 mg via ORAL
  Filled 2024-05-03 (×2): qty 1

## 2024-05-03 MED ORDER — FENTANYL CITRATE (PF) 250 MCG/5ML IJ SOLN
INTRAMUSCULAR | Status: DC | PRN
  Administered 2024-05-03 (×2): 50 ug via INTRAVENOUS

## 2024-05-03 SURGICAL SUPPLY — 19 items
CABLE FARASTAR GEN2 SNGL USE (CABLE) IMPLANT
CATH EZ STEER NAV 8MM D-F CUR (ABLATOR) IMPLANT
CATH FARAWAVE 2.0 31 (CATHETERS) IMPLANT
CATH GE 8FR SOUNDSTAR (CATHETERS) IMPLANT
CATH OCTARAY 2.0 F 3-3-3-3-3 (CATHETERS) IMPLANT
CATH WEBSTER BI DIR CS D-F CRV (CATHETERS) IMPLANT
CLOSURE MYNX CONTROL 6F/7F (Vascular Products) IMPLANT
CLOSURE PERCLOSE PROSTYLE (VASCULAR PRODUCTS) IMPLANT
COVER SWIFTLINK CONNECTOR (BAG) ×1 IMPLANT
DILATOR VESSEL 38 20CM 16FR (INTRODUCER) IMPLANT
GUIDEWIRE INQWIRE 1.5J.035X260 (WIRE) IMPLANT
KIT VERSACROSS CNCT FARADRIVE (KITS) IMPLANT
PACK EP LF (CUSTOM PROCEDURE TRAY) ×1 IMPLANT
PAD DEFIB RADIO PHYSIO CONN (PAD) ×1 IMPLANT
PATCH CARTO3 (PAD) IMPLANT
SHEATH FARADRIVE STEERABLE (SHEATH) IMPLANT
SHEATH PINNACLE 8F 10CM (SHEATH) IMPLANT
SHEATH PINNACLE 9F 10CM (SHEATH) IMPLANT
SHEATH PROBE COVER 6X72 (BAG) IMPLANT

## 2024-05-03 NOTE — Anesthesia Preprocedure Evaluation (Signed)
 Anesthesia Evaluation  Patient identified by MRN, date of birth, ID band Patient awake    Reviewed: Allergy & Precautions, NPO status , Patient's Chart, lab work & pertinent test results, reviewed documented beta blocker date and time   History of Anesthesia Complications (+) DIFFICULT AIRWAY and history of anesthetic complications  Airway Mallampati: II  TM Distance: >3 FB     Dental no notable dental hx.    Pulmonary pneumonia, COPD, Current Smoker and Patient abstained from smoking.   breath sounds clear to auscultation       Cardiovascular hypertension, + angina  + CAD, + Past MI and +CHF  (-) CABG + dysrhythmias Atrial Fibrillation  Rhythm:Regular Rate:Normal     Neuro/Psych  Headaches, neg Seizures PSYCHIATRIC DISORDERS Anxiety Depression     Neuromuscular disease    GI/Hepatic ,GERD  ,,(+) neg Cirrhosis        Endo/Other    Renal/GU Renal disease     Musculoskeletal  (+) Arthritis ,    Abdominal   Peds  Hematology   Anesthesia Other Findings   Reproductive/Obstetrics                              Anesthesia Physical Anesthesia Plan  ASA: 3  Anesthesia Plan: General   Post-op Pain Management:    Induction: Intravenous  PONV Risk Score and Plan: 2 and Ondansetron  and Dexamethasone   Airway Management Planned: Video Laryngoscope Planned and Oral ETT  Additional Equipment:   Intra-op Plan:   Post-operative Plan: Extubation in OR  Informed Consent: I have reviewed the patients History and Physical, chart, labs and discussed the procedure including the risks, benefits and alternatives for the proposed anesthesia with the patient or authorized representative who has indicated his/her understanding and acceptance.     Dental advisory given  Plan Discussed with: CRNA  Anesthesia Plan Comments:         Anesthesia Quick Evaluation

## 2024-05-03 NOTE — H&P (Signed)
  Electrophysiology Office Note:   Date:  05/03/2024  ID:  Jennifer Martin, DOB 1956-07-28, MRN [id number]  Primary Cardiologist: Redell Leiter, MD Primary Heart Failure: None Electrophysiologist: Havannah Streat Gladis Norton, MD      History of Present Illness:   Jennifer Martin is a 68 y.o. female with h/o coronary artery disease, hypertension, hyperlipidemia, atrial fibrillation/flutter seen today for routine electrophysiology followup.   Today, denies symptoms of palpitations, chest pain, dyspnea, orthopnea, PND, lower extremity edema, claudication, dizziness, presyncope, syncope, bleeding, or neurologic sequela. The patient is tolerating medications without difficulties. Plan ablation today.   EP Information / Studies Reviewed:    EKG is ordered today. Personal review as below.    Sinus rhythm, 1dAVB    Risk Assessment/Calculations:    CHA2DS2-VASc Score = 5   This indicates a 7.2% annual risk of stroke. The patient's score is based upon: CHF History: 1 HTN History: 1 Diabetes History: 0 Stroke History: 0 Vascular Disease History: 1 Age Score: 1 Gender Score: 1            Physical Exam:   VS:  BP (!) 140/77   Pulse (!) 58   Temp 97.9 F (36.6 C) (Oral)   Resp 16   Ht 5' (1.524 m)   Wt 56.7 kg   SpO2 95%   BMI 24.41 kg/m    Wt Readings from Last 3 Encounters:  05/03/24 56.7 kg  02/08/24 55.4 kg  12/15/23 56 kg    GEN: No acute distress.   Neck: No JVD Cardiac: RRR, no murmurs, rubs, or gallops.  Respiratory: normal BS bilaterally. GI: Soft, nontender, non-distended  MS: No edema; No deformity. Neuro:  Nonfocal  Skin: warm and dry Psych: Normal affect    ASSESSMENT AND PLAN:    1.  Paroxysmal atrial fibrillation/atypical flutter:Jennifer Martin has presented today for surgery, with the diagnosis of AF.  The various methods of treatment have been discussed with the patient and family. After consideration of risks, benefits and other options for treatment, the patient  has consented to  Procedure(s): Catheter ablation as a surgical intervention .  Risks include but not limited to complete heart block, stroke, esophageal damage, nerve damage, bleeding, vascular damage, tamponade, perforation, MI, and death. The patient's history has been reviewed, patient examined, no change in status, stable for surgery.  I have reviewed the patient's chart and labs.  Questions were answered to the patient's satisfaction.    Ashley Bultema Norton, MD 05/03/2024 8:48 AM

## 2024-05-03 NOTE — Progress Notes (Signed)
 C/O 5/10 chest discomfort. Medicated w/fentanyl  and Dr. Inocencio made aware. No orders.  1229: discomfort down to 1/10 now.

## 2024-05-03 NOTE — Anesthesia Procedure Notes (Signed)
 Procedure Name: Intubation Date/Time: 05/03/2024 9:40 AM  Performed by: Christopher Comings, CRNAPre-anesthesia Checklist: Patient identified, Emergency Drugs available, Suction available and Patient being monitored Patient Re-evaluated:Patient Re-evaluated prior to induction Oxygen  Delivery Method: Circle system utilized Preoxygenation: Pre-oxygenation with 100% oxygen  Induction Type: IV induction Ventilation: Mask ventilation without difficulty and Oral airway inserted - appropriate to patient size Laryngoscope Size: Glidescope and 3 Grade View: Grade I Tube type: Oral Tube size: 6.5 mm Number of attempts: 1 Airway Equipment and Method: Oral airway and Rigid stylet Placement Confirmation: ETT inserted through vocal cords under direct vision, positive ETCO2 and breath sounds checked- equal and bilateral Secured at: 21 cm Tube secured with: Tape Dental Injury: Teeth and Oropharynx as per pre-operative assessment

## 2024-05-03 NOTE — Transfer of Care (Signed)
 Immediate Anesthesia Transfer of Care Note  Patient: Jennifer Martin  Procedure(s) Performed: ATRIAL FIBRILLATION ABLATION  Patient Location: PACU  Anesthesia Type:General  Level of Consciousness: awake, alert , and oriented  Airway & Oxygen  Therapy: Patient Spontanous Breathing  Post-op Assessment: Report given to RN and Post -op Vital signs reviewed and stable  Post vital signs: Reviewed and stable  Last Vitals:  Vitals Value Taken Time  BP 140/79 05/03/24 11:12  Temp    Pulse 72 05/03/24 11:13  Resp 15 05/03/24 11:13  SpO2 93% 05/03/24 11:13  Vitals shown include unfiled device data.  Last Pain:  Vitals:   05/03/24 0824  TempSrc: Oral         Complications: There were no known notable events for this encounter.

## 2024-05-03 NOTE — Discharge Instructions (Signed)

## 2024-05-04 ENCOUNTER — Encounter (HOSPITAL_COMMUNITY): Payer: Self-pay | Admitting: Cardiology

## 2024-05-04 ENCOUNTER — Telehealth (HOSPITAL_COMMUNITY): Payer: Self-pay

## 2024-05-04 NOTE — Telephone Encounter (Signed)
 Spoke with patient to complete post procedure follow up call.  Patient reports no complications with groin sites.   Instructions reviewed with patient:  Remove large bandage at puncture site after 24 hours. It is normal to have bruising, tenderness, mild swelling, and a pea or marble sized lump/knot at the groin site which can take up to three months to resolve.  Get help right away if you notice sudden swelling at the puncture site.  Check your puncture site every day for signs of infection: fever, redness, swelling, pus drainage, warmth, foul odor or excessive pain. If this occurs, please call the office at [phone], to speak with the nurse. Get help right away if your puncture site is bleeding and the bleeding does not stop after applying firm pressure to the area.  You may continue to have skipped beats/ atrial fibrillation during the first several months after your procedure.  It is very important not to miss any doses of your blood thinner Xarelto .  You will follow up with the Afib clinic on 05/31/24 and follow up with the Afib clinic on 08/01/24.    Patient verbalized understanding to all instructions provided.

## 2024-05-04 NOTE — Anesthesia Postprocedure Evaluation (Signed)
 Anesthesia Post Note  Patient: Jennifer Martin  Procedure(s) Performed: ATRIAL FIBRILLATION ABLATION     Patient location during evaluation: PACU Anesthesia Type: General Level of consciousness: awake and alert Pain management: pain level controlled Vital Signs Assessment: post-procedure vital signs reviewed and stable Respiratory status: spontaneous breathing, nonlabored ventilation, respiratory function stable and patient connected to nasal cannula oxygen  Cardiovascular status: blood pressure returned to baseline and stable Postop Assessment: no apparent nausea or vomiting Anesthetic complications: no   There were no known notable events for this encounter.  Last Vitals:  Vitals:   05/03/24 1450 05/03/24 1451  BP:    Pulse: 67 67  Resp: 17 18  Temp:    SpO2: 96% 95%    Last Pain:  Vitals:   05/03/24 1451  TempSrc:   PainSc: 0-No pain                 Lynwood [name]

## 2024-05-05 MED FILL — Fentanyl Citrate Preservative Free (PF) Inj 100 MCG/2ML: INTRAMUSCULAR | Qty: 2 | Status: AC

## 2024-05-11 ENCOUNTER — Encounter: Payer: Self-pay | Admitting: Emergency Medicine

## 2024-05-26 ENCOUNTER — Other Ambulatory Visit: Payer: Self-pay | Admitting: Cardiology

## 2024-05-31 ENCOUNTER — Ambulatory Visit (HOSPITAL_COMMUNITY)
Admission: RE | Admit: 2024-05-31 | Discharge: 2024-05-31 | Disposition: A | Discharge status: Home / Self Care | Source: Ambulatory Visit | Attending: Physician Assistant | Admitting: Physician Assistant

## 2024-05-31 ENCOUNTER — Encounter (HOSPITAL_COMMUNITY): Payer: Self-pay | Admitting: Physician Assistant

## 2024-05-31 VITALS — BP 138/86 | HR 62 | Ht 60.0 in | Wt 124.0 lb

## 2024-05-31 DIAGNOSIS — I4819 Other persistent atrial fibrillation: Secondary | ICD-10-CM | POA: Diagnosis not present

## 2024-05-31 DIAGNOSIS — Z79899 Other long term (current) drug therapy: Secondary | ICD-10-CM | POA: Diagnosis not present

## 2024-05-31 DIAGNOSIS — D6869 Other thrombophilia: Secondary | ICD-10-CM | POA: Diagnosis not present

## 2024-05-31 DIAGNOSIS — Z5181 Encounter for therapeutic drug level monitoring: Secondary | ICD-10-CM

## 2024-05-31 NOTE — Progress Notes (Signed)
 Primary Care Physician: Thurmond Cathlyn LABOR., MD Primary Cardiologist: Redell Leiter, MD Electrophysiologist: Will Gladis Norton, MD  Referring Physician: Dr Leiter Jennifer Martin is a 68 y.o. female with a history of CAD, CHF, COPD, HLD, HTN, SIADH, mobitz I AV block, atrial flutter, atrial fibrillation who presents for follow up in the [facility]. She was admitted to [facility] with atrial fibrillation. She is post ablation for atrial fibrillation and flutter 05/29/2017. Patient is on Xarelto  for stroke prevention.  She had recurrence of her atrial flutter and she underwent TEE guided DCCV on 08/07/23. She had recurrence of her atrial flutter at follow up on 09/14/23 and her her amiodarone  was increased by Dr Leiter. Had another DCCV 09/25/23 and then repeat ablation with Dr Norton on 05/03/24.  Patient returns for follow up for atrial fibrillation and amiodarone  monitoring. She reports that she has felt well after her ablation. She has more energy. She remains in SR. She denies any chest pain or groin issues. No bleeding issues on anticoagulation.   Today, she  denies symptoms of palpitations, chest pain, shortness of breath, orthopnea, PND, lower extremity edema, dizziness, presyncope, syncope, snoring, daytime somnolence, bleeding, or neurologic sequela. The patient is tolerating medications without difficulties and is otherwise without complaint today.    Atrial Fibrillation Risk Factors:  she does not have symptoms or diagnosis of sleep apnea. she does not have a history of rheumatic fever.   Atrial Fibrillation Management history:  Previous antiarrhythmic drugs: amiodarone   Previous cardioversions: 08/07/23, 09/25/23 Previous ablations: 05/29/17, 05/03/24 Anticoagulation history: Xarelto    ROS- All systems are reviewed and negative except as per the HPI above.  Past Medical History:  Diagnosis Date   Acute pulmonary edema (HCC)    Adrenal  insufficiency (Addison's disease) (HCC) 04/14/2022   Possible. Saw Endocrine.     Anxiety    Aortic regurgitation 06/17/2017   Mild to moderate    Atherosclerosis of coronary artery of native heart with angina pectoris [facility])    Atrial flutter (HCC)    CAD (coronary artery disease)    Calculus of gallbladder without cholecystitis without obstruction 03/22/2020   CHF (congestive heart failure) (HCC)    Chronic anticoagulation 04/13/2017   Chronic combined systolic and diastolic heart failure (HCC) 04/13/2017   varying EF in the past - 45-50% in 09/2016, 37% by echo 08/2016,  61% by nuc 01/2017 then 30-35% this admission (40% by review from Dr. Shlomo)   Chronic obstructive pulmonary disease (HCC) 10/10/2016   COPD (chronic obstructive pulmonary disease) (HCC)    Coronary artery disease involving native coronary artery of native heart with angina pectoris (HCC) 03/25/2017   Overview:  CTO of LCF Lexiscan MPS 07/28/13 with no ischemia, EF 50%   Coronary atherosclerosis of native coronary artery 06/28/2015   CTO of LCF  Lexiscan MPS 07/28/13 with no ischemia, EF 50%   CTO of LCF  Lexiscan MPS 07/28/13 with no ischemia, EF 50%     Degenerative arthritis of cervical spine with nerve compression 01/28/2022   Demand ischemia (HCC)    Depression    Difficult intubation     small airway and need a little tube    Diverticulosis    Diverticulosis of colon 05/08/2020   Fibrocystic breast changes of both breasts 02/04/2016   Last Assessment & Plan:   She follows with Dr. Bert for this     GERD (gastroesophageal reflux disease)    Gout 05/12/2016  Last Assessment & Plan:  She needs her uric acid level checked    High risk medication use 05/12/2016   History of atrial fibrillation 06/28/2015   Ablation 2018 follows with EP provider on xarelto   Last Assessment & Plan:  This is stable for her Last Assessment & Plan:  She has not had any episodes of this stable   Hypercoagulable state due to  persistent atrial fibrillation (HCC) 09/22/2023   Hyperlipidemia    Hypertension    Hypertensive heart disease with heart failure (HCC) 03/25/2017   Inappropriate ADH syndrome (HCC)    Inguinal hernia 05/12/2016   Inverted nipple 11/10/2022   Right.  Chronic.     LBBB (left bundle branch block)    Left bundle branch block (LBBB) 06/28/2015   Lipoma of skin and subcutaneous tissue of trunk 06/09/2023   Malaise and fatigue 05/12/2016   Last Assessment & Plan:  Update her TSh for her and her CBC, she is advised to stop smoking    Melasma 11/10/2022   Migraine    none since ~ 2012; mild ones then when I did have them because of the beta blockers I was on (06/09/2017)   Mild episode of recurrent major depressive disorder (HCC) 09/27/2016   Mitral regurgitation 06/17/2017   Mild    Mixed hyperlipidemia 06/28/2015   Last Assessment & Plan:    Reviewed with her the last lipids done through cardiology     Mobitz type 1 second degree AV block    Moderate episode of recurrent major depressive disorder (HCC) 09/27/2016   Myocardial infarction (HCC)    I've had light ones (06/09/2017)   Near syncope    On amiodarone  therapy 04/13/2017   Osteoarthritis    Palpitation 01/13/2018   Paroxysmal atrial fibrillation (HCC) 05/29/2017   Pneumonia    couple times (06/09/2017)   Preoperative cardiovascular examination 10/01/2018   Primary osteoarthritis involving multiple joints 05/12/2016   Last Assessment & Plan:   This is stable for her at this time     SIADH (syndrome of inappropriate ADH production) (HCC) 05/12/2016   Last Assessment & Plan:    Her sodium is stable for her     SOB (shortness of breath) on exertion    Statin intolerance 09/22/2019   Statin myopathy 09/27/2019   Tobacco dependence 09/30/2018   V-tach (HCC) 06/28/2015    Current Outpatient Medications  Medication Sig Dispense Refill   acetaminophen  (TYLENOL ) 500 MG tablet Take 1,000 mg by mouth 2 (two) times daily as  needed for mild pain or headache.     albuterol  (PROVENTIL  HFA;VENTOLIN  HFA) 108 (90 Base) MCG/ACT inhaler Inhale 2 puffs into the lungs every 6 (six) hours as needed for wheezing or shortness of breath.     amiodarone  (PACERONE ) 200 MG tablet Take 1 tablet (200 mg total) by mouth daily. 90 tablet 2   Ascorbic Acid (VITAMIN C) 1000 MG tablet Take 1,000 mg by mouth daily.     Budeson-Glycopyrrol-Formoterol  (BREZTRI AEROSPHERE) 160-9-4.8 MCG/ACT AERO Inhale 2 puffs into the lungs daily. May use a second time as needed for shortness of breath     Calcium  Carb-Cholecalciferol (CALCIUM  + VITAMIN D3 PO) Take 1 tablet by mouth daily.     colchicine  0.6 MG tablet Take 1 tablet (0.6 mg total) by mouth 2 (two) times daily. 10 tablet 0   escitalopram  (LEXAPRO ) 20 MG tablet Take 1 tablet (20 mg total) by mouth daily. 90 tablet 3   ezetimibe  (ZETIA ) 10 MG tablet  TAKE ONE TABLET BY MOUTH TWICE A WEEK 24 tablet 2   fluticasone  (FLONASE ) 50 MCG/ACT nasal spray Place 2 sprays into both nostrils daily as needed for allergies or rhinitis.     furosemide  (LASIX ) 20 MG tablet Take 1 tablet (20 mg total) by mouth 2 (two) times daily. Take only one tablet daily if you weigh 124lbs or less. (Patient taking differently: Take 20 mg by mouth daily. May take a second 20 mg as needed for swelling) 180 tablet 2   ipratropium-albuterol  (DUONEB) 0.5-2.5 (3) MG/3ML SOLN Take 3 mLs by nebulization every 6 (six) hours as needed (shortness of breath).     LORazepam  (ATIVAN ) 1 MG tablet Take 0.5-1 mg by mouth 3 (three) times daily as needed for anxiety.     Magnesium Bisglycinate (MAG GLYCINATE PO) Take 200 mg by mouth daily.     metoprolol  succinate (TOPROL -XL) 25 MG 24 hr tablet TAKE ONE TABLET BY MOUTH DAILY 90 tablet 2   nitroGLYCERIN  (NITROSTAT ) 0.4 MG SL tablet Place 0.4 mg under the tongue every 5 (five) minutes as needed for chest pain.     pantoprazole  (PROTONIX ) 40 MG tablet Take 40 mg by mouth daily.     potassium chloride   (KLOR-CON ) 10 MEQ tablet TAKE ONE TABLET BY MOUTH TWICE DAILY 180 tablet 3   sacubitril -valsartan  (ENTRESTO ) 24-26 MG Take 1 tablet by mouth 2 (two) times daily. 180 tablet 3   traMADol (ULTRAM) 50 MG tablet Take 50-100 mg by mouth every 6 (six) hours as needed for moderate pain.     XARELTO  20 MG TABS tablet TAKE ONE TABLET BY MOUTH ONCE DAILY 90 tablet 1   zinc gluconate 50 MG tablet Take 50 mg by mouth daily.     No current facility-administered medications for this encounter.    Physical Exam: BP 138/86   Pulse 62   Ht 5' (1.524 m)   Wt 56.2 kg   BMI 24.22 kg/m   GEN: Well nourished, well developed in no acute distress CARDIAC: Regular rate and rhythm, no murmurs, rubs, gallops RESPIRATORY:  Clear to auscultation without rales, wheezing or rhonchi  ABDOMEN: Soft, non-tender, non-distended EXTREMITIES:  No edema; No deformity    Wt Readings from Last 3 Encounters:  05/31/24 56.2 kg  05/03/24 56.7 kg  02/08/24 55.4 kg     EKG today demonstrates  SR, 1st degree AV block, LBBB Vent. rate 62 BPM PR interval 264 ms QRS duration 136 ms QT/QTcB 498/505 ms   Echo 10/06/23  1. Left ventricular ejection fraction, by estimation, is 50 to 55%. The  left ventricle has low normal function. The left ventricle has no regional  wall motion abnormalities. Left ventricular diastolic parameters are  consistent with Grade I diastolic dysfunction (impaired relaxation).   2. Right ventricular systolic function is normal. The right ventricular  size is normal. There is normal pulmonary artery systolic pressure.   3. Left atrial size was mildly dilated.   4. The mitral valve is normal in structure. No evidence of mitral valve  regurgitation. No evidence of mitral stenosis.   5. The aortic valve is normal in structure. Aortic valve regurgitation is  moderate. Aortic valve sclerosis/calcification is present, without any  evidence of aortic stenosis.   6. The inferior vena cava is normal in  size with greater than 50%  respiratory variability, suggesting right atrial pressure of 3 mmHg.    CHA2DS2-VASc Score = 5  The patient's score is based upon: CHF History: 1 HTN History:  1 Diabetes History: 0 Stroke History: 0 Vascular Disease History: 1 Age Score: 1 Gender Score: 1       ASSESSMENT AND PLAN: Persistent Atrial Fibrillation/atrial flutter (ICD10:  I48.19) The patient's CHA2DS2-VASc score is 5, indicating a 7.2% annual risk of stroke.   S/p afib and flutter ablation 05/29/17 with repeat afib and flutter ablation 05/03/24.  Patient appears to be maintaining SR. Continue amiodarone  200 mg daily for now Continue Toprol  25 mg daily Continue Xarelto  20 mg daily with no missed doses for 3 months post ablation.   Secondary Hypercoagulable State (ICD10:  D68.69) The patient is at significant risk for stroke/thromboembolism based upon her CHA2DS2-VASc Score of 5.  Continue Rivaroxaban  (Xarelto ). No bleeding issues.   High Risk Medication Monitoring (ICD 10: Z79.899) Intervals on ECG acceptable for amiodarone  monitoring.    HTN Stable on current regimen  CAD CAC score 288 No anginal symptoms Followed by Dr Monetta   HFrecEF EF improved with SR GDMT per primary cardiology team Fluid status appears stable today    Follow up in the AF clinic in 2 months.     Daril Kicks PA-C [facility] [address] [place], [place] [address] [phone]

## 2024-06-09 NOTE — Progress Notes (Deleted)
 Cardiology Office Note:    Date:  06/09/2024   ID:  Jennifer Martin, DOB January 07, 1956, MRN [id number]  PCP:  Thurmond Cathlyn LABOR., MD  Cardiologist:  Redell Leiter, MD    Referring MD: Thurmond Cathlyn LABOR., MD    ASSESSMENT:    1. Paroxysmal atrial fibrillation (HCC)   2. Atrial flutter, unspecified type (HCC)   3. Status post catheter ablation of atrial fibrillation   4. On amiodarone  therapy   5. Chronic anticoagulation   6. Hypertensive heart disease with heart failure (HCC)   7. Left bundle branch block (LBBB)   8. Coronary artery disease involving native coronary artery of native heart without angina pectoris   9. Mixed hyperlipidemia   10. Statin intolerance    PLAN:    In order of problems listed above:  ***   Next appointment: ***   Medication Adjustments/Labs and Tests Ordered: Current medicines are reviewed at length with the patient today.  Concerns regarding medicines are outlined above.  No orders of the defined types were placed in this encounter.  No orders of the defined types were placed in this encounter.    History of Present Illness:    Jennifer Martin is a 68 y.o. female with a hx of complex heart disease including atrial fibrillation and flutter chronic anticoagulation hypertensive heart disease with heart failure previous severe symptomatic hyponatremia coronary artery disease hyperlipidemia left bundle branch block COPD and statin intolerance with myopathy last seen 12/15/2023.  She is followed by EP with a history of atrial fibrillation and flutter with previous ablation 2018 and repeat ablation November 2024 recurrent atrial flutter long-term anticoagulant therapy.  Latest ejection fraction was normalized December 2000 2450 to 55%. \ Recent labs 2 weeks ago showed a cholesterol 190 LDL 105 non-HDL cholesterol 124 Compliance with diet, lifestyle and medications: *** Past Medical History:  Diagnosis Date   Acute pulmonary edema (HCC)    Adrenal insufficiency  (Addison's disease) (HCC) 04/14/2022   Possible. Saw Endocrine.     Anxiety    Aortic regurgitation 06/17/2017   Mild to moderate    Atherosclerosis of coronary artery of native heart with angina pectoris [facility])    Atrial flutter (HCC)    CAD (coronary artery disease)    Calculus of gallbladder without cholecystitis without obstruction 03/22/2020   CHF (congestive heart failure) (HCC)    Chronic anticoagulation 04/13/2017   Chronic combined systolic and diastolic heart failure (HCC) 04/13/2017   varying EF in the past - 45-50% in 09/2016, 37% by echo 08/2016,  61% by nuc 01/2017 then 30-35% this admission (40% by review from Dr. Shlomo)   Chronic obstructive pulmonary disease (HCC) 10/10/2016   COPD (chronic obstructive pulmonary disease) (HCC)    Coronary artery disease involving native coronary artery of native heart with angina pectoris (HCC) 03/25/2017   Overview:  CTO of LCF Lexiscan MPS 07/28/13 with no ischemia, EF 50%   Coronary atherosclerosis of native coronary artery 06/28/2015   CTO of LCF  Lexiscan MPS 07/28/13 with no ischemia, EF 50%   CTO of LCF  Lexiscan MPS 07/28/13 with no ischemia, EF 50%     Degenerative arthritis of cervical spine with nerve compression 01/28/2022   Demand ischemia (HCC)    Depression    Difficult intubation     small airway and need a little tube    Diverticulosis    Diverticulosis of colon 05/08/2020   Fibrocystic breast changes of both breasts 02/04/2016   Last Assessment &  Plan:   She follows with Dr. Bert for this     GERD (gastroesophageal reflux disease)    Gout 05/12/2016   Last Assessment & Plan:  She needs her uric acid level checked    High risk medication use 05/12/2016   History of atrial fibrillation 06/28/2015   Ablation 2018 follows with EP provider on xarelto   Last Assessment & Plan:  This is stable for her Last Assessment & Plan:  She has not had any episodes of this stable   Hypercoagulable state due to persistent atrial  fibrillation (HCC) 09/22/2023   Hyperlipidemia    Hypertension    Hypertensive heart disease with heart failure (HCC) 03/25/2017   Inappropriate ADH syndrome (HCC)    Inguinal hernia 05/12/2016   Inverted nipple 11/10/2022   Right.  Chronic.     LBBB (left bundle branch block)    Left bundle branch block (LBBB) 06/28/2015   Lipoma of skin and subcutaneous tissue of trunk 06/09/2023   Malaise and fatigue 05/12/2016   Last Assessment & Plan:  Update her TSh for her and her CBC, she is advised to stop smoking    Melasma 11/10/2022   Migraine    none since ~ 2012; mild ones then when I did have them because of the beta blockers I was on (06/09/2017)   Mild episode of recurrent major depressive disorder (HCC) 09/27/2016   Mitral regurgitation 06/17/2017   Mild    Mixed hyperlipidemia 06/28/2015   Last Assessment & Plan:    Reviewed with her the last lipids done through cardiology     Mobitz type 1 second degree AV block    Moderate episode of recurrent major depressive disorder (HCC) 09/27/2016   Myocardial infarction (HCC)    I've had light ones (06/09/2017)   Near syncope    On amiodarone  therapy 04/13/2017   Osteoarthritis    Palpitation 01/13/2018   Paroxysmal atrial fibrillation (HCC) 05/29/2017   Pneumonia    couple times (06/09/2017)   Preoperative cardiovascular examination 10/01/2018   Primary osteoarthritis involving multiple joints 05/12/2016   Last Assessment & Plan:   This is stable for her at this time     SIADH (syndrome of inappropriate ADH production) (HCC) 05/12/2016   Last Assessment & Plan:    Her sodium is stable for her     SOB (shortness of breath) on exertion    Statin intolerance 09/22/2019   Statin myopathy 09/27/2019   Tobacco dependence 09/30/2018   V-tach (HCC) 06/28/2015    Current Medications: No outpatient medications have been marked as taking for the 06/13/24 encounter (Appointment) with Monetta Redell PARAS, MD.      EKGs/Labs/Other  Studies Reviewed:    The following studies were reviewed today:  Cardiac Studies & Procedures   ______________________________________________________________________________________________   STRESS TESTS  MYOCARDIAL PERFUSION IMAGING 01/26/2017   ECHOCARDIOGRAM  ECHOCARDIOGRAM COMPLETE 10/06/2023  Narrative ECHOCARDIOGRAM REPORT    Patient Name:   HAYLEIGH BAWA Date of Exam: 10/06/2023 Medical Rec #:  [id number]     Height:       60.0 in Accession #:    [id number]    Weight:       122.0 lb Date of Birth:  Mar 15, 1956     BSA:          1.513 m Patient Age:    67 years      BP:           122/85 mmHg Patient Gender: F  HR:           59 bpm. Exam Location:  [place]  Procedure: 2D Echo, Cardiac Doppler, Color Doppler, Intracardiac Opacification Agent and Strain Analysis  Indications:    Atypical atrial flutter (HCC) [I48.4 (ICD-10-CM)]; Chronic anticoagulation [Z79.01 (ICD-10-CM)]; Hypertensive heart disease with heart failure (HCC) [I11.0 (ICD-10-CM)]; Hyponatremia [E87.1 (ICD-10-CM)]  History:        Patient has prior history of Echocardiogram examinations, most recent 08/07/2023. Previous Myocardial Infarction, COPD, Arrythmias:LBBB and Atrial Flutter, Signs/Symptoms:Hypertensive Heart Disease; Risk Factors:Hypertension and Dyslipidemia.  Sonographer:    Lynwood Silvas RDCS Referring Phys: [id number] Emilie Carp J Tomislav Micale  IMPRESSIONS   1. Left ventricular ejection fraction, by estimation, is 50 to 55%. The left ventricle has low normal function. The left ventricle has no regional wall motion abnormalities. Left ventricular diastolic parameters are consistent with Grade I diastolic dysfunction (impaired relaxation). 2. Right ventricular systolic function is normal. The right ventricular size is normal. There is normal pulmonary artery systolic pressure. 3. Left atrial size was mildly dilated. 4. The mitral valve is normal in structure. No evidence of mitral valve  regurgitation. No evidence of mitral stenosis. 5. The aortic valve is normal in structure. Aortic valve regurgitation is moderate. Aortic valve sclerosis/calcification is present, without any evidence of aortic stenosis. 6. The inferior vena cava is normal in size with greater than 50% respiratory variability, suggesting right atrial pressure of 3 mmHg.  FINDINGS Left Ventricle: Left ventricular ejection fraction, by estimation, is 50 to 55%. The left ventricle has low normal function. The left ventricle has no regional wall motion abnormalities. Definity  contrast agent was given IV to delineate the left ventricular endocardial borders. The left ventricular internal cavity size was normal in size. There is no left ventricular hypertrophy. Left ventricular diastolic parameters are consistent with Grade I diastolic dysfunction (impaired relaxation).  Right Ventricle: The right ventricular size is normal. No increase in right ventricular wall thickness. Right ventricular systolic function is normal. There is normal pulmonary artery systolic pressure. The tricuspid regurgitant velocity is 2.44 m/s, and with an assumed right atrial pressure of 3 mmHg, the estimated right ventricular systolic pressure is 26.8 mmHg.  Left Atrium: Left atrial size was mildly dilated.  Right Atrium: Right atrial size was normal in size.  Pericardium: There is no evidence of pericardial effusion.  Mitral Valve: The mitral valve is normal in structure. No evidence of mitral valve regurgitation. No evidence of mitral valve stenosis.  Tricuspid Valve: The tricuspid valve is normal in structure. Tricuspid valve regurgitation is mild . No evidence of tricuspid stenosis.  Aortic Valve: The aortic valve is normal in structure. Aortic valve regurgitation is moderate. Aortic regurgitation PHT measures 555 msec. Aortic valve sclerosis/calcification is present, without any evidence of aortic stenosis.  Pulmonic Valve: The pulmonic  valve was normal in structure. Pulmonic valve regurgitation is mild. No evidence of pulmonic stenosis.  Aorta: The aortic root is normal in size and structure.  Venous: The inferior vena cava is normal in size with greater than 50% respiratory variability, suggesting right atrial pressure of 3 mmHg.  IAS/Shunts: No atrial level shunt detected by color flow Doppler.   LEFT VENTRICLE PLAX 2D LVIDd:         4.60 cm     Diastology LVIDs:         3.60 cm     LV e' medial:    5.98 cm/s LV PW:         1.00 cm  LV E/e' medial:  17.7 LV IVS:        1.20 cm     LV e' lateral:   8.54 cm/s LVOT diam:     2.20 cm     LV E/e' lateral: 12.4 LV SV:         69 LV SV Index:   45 LVOT Area:     3.80 cm  LV Volumes (MOD) LV vol d, MOD A2C: 54.2 ml LV vol d, MOD A4C: 67.5 ml LV vol s, MOD A2C: 29.3 ml LV vol s, MOD A4C: 44.2 ml LV SV MOD A2C:     24.9 ml LV SV MOD A4C:     67.5 ml LV SV MOD BP:      29.7 ml  RIGHT VENTRICLE          IVC RV Basal diam:  2.90 cm  IVC diam: 1.70 cm  LEFT ATRIUM           Index        RIGHT ATRIUM           Index LA diam:      3.60 cm 2.38 cm/m   RA Area:     12.90 cm LA Vol (A4C): 67.2 ml 44.45 ml/m  RA Volume:   29.90 ml  19.76 ml/m AORTIC VALVE LVOT Vmax:   79.95 cm/s LVOT Vmean:  50.850 cm/s LVOT VTI:    0.181 m AI PHT:      555 msec  AORTA Ao Root diam: 3.10 cm Ao Asc diam:  3.30 cm Ao Desc diam: 2.10 cm  MV E velocity: 106.00 cm/s  TRICUSPID VALVE MV A velocity: 37.20 cm/s   TR Peak grad:   23.8 mmHg MV E/A ratio:  2.85         TR Vmax:        244.00 cm/s  SHUNTS Systemic VTI:  0.18 m Systemic Diam: 2.20 cm  Jennifer Crape MD Electronically signed by Jennifer Crape MD Signature Date/Time: 10/07/2023/11:52:23 AM    Final          ______________________________________________________________________________________________          Recent Labs: 12/15/2023: ALT 9; TSH 3.630 04/15/2024: BUN 8; Creatinine, Ser 0.52; Hemoglobin  13.3; Platelets 338; Potassium 4.8; Sodium 132  Recent Lipid Panel    Component Value Date/Time   CHOL 182 12/15/2023 1120   TRIG 94 12/15/2023 1120   HDL 71 12/15/2023 1120   CHOLHDL 2.6 12/15/2023 1120   CHOLHDL 3.2 06/11/2017 1439   VLDL 20 06/11/2017 1439   LDLCALC 94 12/15/2023 1120    Physical Exam:    VS:  There were no vitals taken for this visit.    Wt Readings from Last 3 Encounters:  05/31/24 124 lb (56.2 kg)  05/03/24 125 lb (56.7 kg)  02/08/24 122 lb 3.2 oz (55.4 kg)     GEN: *** Well nourished, well developed in no acute distress HEENT: Normal NECK: No JVD; No carotid bruits LYMPHATICS: No lymphadenopathy CARDIAC: ***RRR, no murmurs, rubs, gallops RESPIRATORY:  Clear to auscultation without rales, wheezing or rhonchi  ABDOMEN: Soft, non-tender, non-distended MUSCULOSKELETAL:  No edema; No deformity  SKIN: Warm and dry NEUROLOGIC:  Alert and oriented x 3 PSYCHIATRIC:  Normal affect    Signed, Redell Leiter, MD  06/09/2024 10:15 AM    [facility]

## 2024-06-13 ENCOUNTER — Ambulatory Visit: Admitting: Cardiology

## 2024-06-13 DIAGNOSIS — I2510 Atherosclerotic heart disease of native coronary artery without angina pectoris: Secondary | ICD-10-CM

## 2024-06-13 DIAGNOSIS — I110 Hypertensive heart disease with heart failure: Secondary | ICD-10-CM

## 2024-06-13 DIAGNOSIS — Z789 Other specified health status: Secondary | ICD-10-CM

## 2024-06-13 DIAGNOSIS — I480 Paroxysmal atrial fibrillation: Secondary | ICD-10-CM

## 2024-06-13 DIAGNOSIS — E782 Mixed hyperlipidemia: Secondary | ICD-10-CM

## 2024-06-13 DIAGNOSIS — I447 Left bundle-branch block, unspecified: Secondary | ICD-10-CM

## 2024-06-13 DIAGNOSIS — Z9889 Other specified postprocedural states: Secondary | ICD-10-CM

## 2024-06-13 DIAGNOSIS — Z7901 Long term (current) use of anticoagulants: Secondary | ICD-10-CM

## 2024-06-13 DIAGNOSIS — Z79899 Other long term (current) drug therapy: Secondary | ICD-10-CM

## 2024-06-13 DIAGNOSIS — I4892 Unspecified atrial flutter: Secondary | ICD-10-CM

## 2024-06-29 ENCOUNTER — Other Ambulatory Visit (HOSPITAL_COMMUNITY): Payer: Self-pay

## 2024-07-05 ENCOUNTER — Other Ambulatory Visit: Payer: Self-pay | Admitting: Cardiology

## 2024-07-05 DIAGNOSIS — I480 Paroxysmal atrial fibrillation: Secondary | ICD-10-CM

## 2024-07-05 NOTE — Telephone Encounter (Signed)
 Prescription refill request for Xarelto  received.  Indication:aflutter Last office visit:8/25 Weight:56.2  kg Age:68 Scr:0.53  8/25 CrCl:88.88  ml/min  Prescription refilled

## 2024-08-01 ENCOUNTER — Ambulatory Visit (HOSPITAL_COMMUNITY)
Admission: RE | Admit: 2024-08-01 | Discharge: 2024-08-01 | Disposition: A | Discharge status: Home / Self Care | Source: Ambulatory Visit | Attending: Physician Assistant | Admitting: Physician Assistant

## 2024-08-01 VITALS — BP 150/70 | HR 62 | Ht 60.0 in | Wt 124.8 lb

## 2024-08-01 DIAGNOSIS — D6869 Other thrombophilia: Secondary | ICD-10-CM | POA: Diagnosis not present

## 2024-08-01 DIAGNOSIS — I4819 Other persistent atrial fibrillation: Secondary | ICD-10-CM | POA: Diagnosis not present

## 2024-08-01 DIAGNOSIS — I4891 Unspecified atrial fibrillation: Secondary | ICD-10-CM | POA: Diagnosis not present

## 2024-08-01 NOTE — Progress Notes (Signed)
 Primary Care Physician: Thurmond Cathlyn LABOR., MD Primary Cardiologist: Redell Leiter, MD Electrophysiologist: Will Gladis Norton, MD  Referring Physician: Dr Leiter Jennifer Martin is a 68 y.o. female with a history of CAD, CHF, COPD, HLD, HTN, SIADH, mobitz I AV block, atrial flutter, atrial fibrillation who presents for follow up in the [facility]. She was admitted to [facility] with atrial fibrillation. She is post ablation for atrial fibrillation and flutter 05/29/2017. Patient is on Xarelto  for stroke prevention.  She had recurrence of her atrial flutter and she underwent TEE guided DCCV on 08/07/23. She had recurrence of her atrial flutter at follow up on 09/14/23 and her her amiodarone  was increased by Dr Leiter. Had another DCCV 09/25/23 and then repeat ablation with Dr Norton on 05/03/24.  Patient returns for follow up for atrial fibrillation. She reports that she had one brief episode of tachypalpitations since her last visit but otherwise has been maintaining SR. No bleeding issues on anticoagulation.   Today, she  denies symptoms of chest pain, shortness of breath, orthopnea, PND, lower extremity edema, dizziness, presyncope, syncope, snoring, daytime somnolence, bleeding, or neurologic sequela. The patient is tolerating medications without difficulties and is otherwise without complaint today.    Atrial Fibrillation Risk Factors:  she does not have symptoms or diagnosis of sleep apnea. she does not have a history of rheumatic fever.   Atrial Fibrillation Management history:  Previous antiarrhythmic drugs: amiodarone   Previous cardioversions: 08/07/23, 09/25/23 Previous ablations: 05/29/17, 05/03/24 Anticoagulation history: Xarelto    ROS- All systems are reviewed and negative except as per the HPI above.  Past Medical History:  Diagnosis Date   Acute pulmonary edema (HCC)    Adrenal insufficiency (Addison's disease) (HCC) 04/14/2022    Possible. Saw Endocrine.     Anxiety    Aortic regurgitation 06/17/2017   Mild to moderate    Atherosclerosis of coronary artery of native heart with angina pectoris    Atrial flutter (HCC)    CAD (coronary artery disease)    Calculus of gallbladder without cholecystitis without obstruction 03/22/2020   CHF (congestive heart failure) (HCC)    Chronic anticoagulation 04/13/2017   Chronic combined systolic and diastolic heart failure (HCC) 04/13/2017   varying EF in the past - 45-50% in 09/2016, 37% by echo 08/2016,  61% by nuc 01/2017 then 30-35% this admission (40% by review from Dr. Shlomo)   Chronic obstructive pulmonary disease (HCC) 10/10/2016   COPD (chronic obstructive pulmonary disease) (HCC)    Coronary artery disease involving native coronary artery of native heart with angina pectoris 03/25/2017   Overview:  CTO of LCF Lexiscan MPS 07/28/13 with no ischemia, EF 50%   Coronary atherosclerosis of native coronary artery 06/28/2015   CTO of LCF  Lexiscan MPS 07/28/13 with no ischemia, EF 50%   CTO of LCF  Lexiscan MPS 07/28/13 with no ischemia, EF 50%     Degenerative arthritis of cervical spine with nerve compression 01/28/2022   Demand ischemia (HCC)    Depression    Difficult intubation     small airway and need a little tube    Diverticulosis    Diverticulosis of colon 05/08/2020   Fibrocystic breast changes of both breasts 02/04/2016   Last Assessment & Plan:   She follows with Dr. Bert for this     GERD (gastroesophageal reflux disease)    Gout 05/12/2016   Last Assessment & Plan:  She needs her uric acid level checked  High risk medication use 05/12/2016   History of atrial fibrillation 06/28/2015   Ablation 2018 follows with EP provider on xarelto   Last Assessment & Plan:  This is stable for her Last Assessment & Plan:  She has not had any episodes of this stable   Hypercoagulable state due to persistent atrial fibrillation (HCC) 09/22/2023   Hyperlipidemia     Hypertension    Hypertensive heart disease with heart failure (HCC) 03/25/2017   Inappropriate ADH syndrome    Inguinal hernia 05/12/2016   Inverted nipple 11/10/2022   Right.  Chronic.     LBBB (left bundle branch block)    Left bundle branch block (LBBB) 06/28/2015   Lipoma of skin and subcutaneous tissue of trunk 06/09/2023   Malaise and fatigue 05/12/2016   Last Assessment & Plan:  Update her TSh for her and her CBC, she is advised to stop smoking    Melasma 11/10/2022   Migraine    none since ~ 2012; mild ones then when I did have them because of the beta blockers I was on (06/09/2017)   Mild episode of recurrent major depressive disorder 09/27/2016   Mitral regurgitation 06/17/2017   Mild    Mixed hyperlipidemia 06/28/2015   Last Assessment & Plan:    Reviewed with her the last lipids done through cardiology     Mobitz type 1 second degree AV block    Moderate episode of recurrent major depressive disorder (HCC) 09/27/2016   Myocardial infarction (HCC)    I've had light ones (06/09/2017)   Near syncope    On amiodarone  therapy 04/13/2017   Osteoarthritis    Palpitation 01/13/2018   Paroxysmal atrial fibrillation (HCC) 05/29/2017   Pneumonia    couple times (06/09/2017)   Preoperative cardiovascular examination 10/01/2018   Primary osteoarthritis involving multiple joints 05/12/2016   Last Assessment & Plan:   This is stable for her at this time     SIADH (syndrome of inappropriate ADH production) 05/12/2016   Last Assessment & Plan:    Her sodium is stable for her     SOB (shortness of breath) on exertion    Statin intolerance 09/22/2019   Statin myopathy 09/27/2019   Tobacco dependence 09/30/2018   V-tach (HCC) 06/28/2015    Current Outpatient Medications  Medication Sig Dispense Refill   acetaminophen  (TYLENOL ) 500 MG tablet Take 1,000 mg by mouth 2 (two) times daily as needed for mild pain or headache. (Patient taking differently: Take 1,000 mg by mouth as  needed for mild pain (pain score 1-3) or headache.)     albuterol  (PROVENTIL  HFA;VENTOLIN  HFA) 108 (90 Base) MCG/ACT inhaler Inhale 2 puffs into the lungs every 6 (six) hours as needed for wheezing or shortness of breath.     Ascorbic Acid (VITAMIN C) 1000 MG tablet Take 1,000 mg by mouth daily.     Budeson-Glycopyrrol-Formoterol  (BREZTRI AEROSPHERE) 160-9-4.8 MCG/ACT AERO Inhale 2 puffs into the lungs daily. May use a second time as needed for shortness of breath     Calcium  Carb-Cholecalciferol (CALCIUM  + VITAMIN D3 PO) Take 1 tablet by mouth daily.     escitalopram  (LEXAPRO ) 20 MG tablet Take 1 tablet (20 mg total) by mouth daily. 90 tablet 3   ezetimibe  (ZETIA ) 10 MG tablet TAKE ONE TABLET BY MOUTH TWICE A WEEK 24 tablet 2   fluticasone  (FLONASE ) 50 MCG/ACT nasal spray Place 2 sprays into both nostrils daily as needed for allergies or rhinitis.     furosemide  (LASIX ) 20 MG  tablet Take 1 tablet (20 mg total) by mouth 2 (two) times daily. Take only one tablet daily if you weigh 124lbs or less. 180 tablet 2   ipratropium-albuterol  (DUONEB) 0.5-2.5 (3) MG/3ML SOLN Take 3 mLs by nebulization every 6 (six) hours as needed (shortness of breath).     LORazepam  (ATIVAN ) 1 MG tablet Take 0.5-1 mg by mouth 3 (three) times daily as needed for anxiety.     Magnesium Bisglycinate (MAG GLYCINATE PO) Take 200 mg by mouth daily.     metoprolol  succinate (TOPROL -XL) 25 MG 24 hr tablet TAKE ONE TABLET BY MOUTH DAILY 90 tablet 2   nitroGLYCERIN  (NITROSTAT ) 0.4 MG SL tablet Place 0.4 mg under the tongue every 5 (five) minutes as needed for chest pain.     pantoprazole  (PROTONIX ) 40 MG tablet Take 40 mg by mouth daily.     potassium chloride  (KLOR-CON ) 10 MEQ tablet TAKE ONE TABLET BY MOUTH TWICE DAILY 180 tablet 3   sacubitril -valsartan  (ENTRESTO ) 24-26 MG Take 1 tablet by mouth 2 (two) times daily. 180 tablet 3   traMADol (ULTRAM) 50 MG tablet Take 50-100 mg by mouth every 6 (six) hours as needed for moderate pain.  (Patient taking differently: Take 50-100 mg by mouth as needed for moderate pain (pain score 4-6).)     XARELTO  20 MG TABS tablet TAKE ONE TABLET BY MOUTH ONCE DAILY 90 tablet 1   zinc gluconate 50 MG tablet Take 50 mg by mouth daily.     No current facility-administered medications for this encounter.    Physical Exam: BP (!) 150/70   Pulse 62   Ht 5' (1.524 m)   Wt 56.6 kg   BMI 24.37 kg/m   GEN: Well nourished, well developed in no acute distress CARDIAC: Regular rate and rhythm, no murmurs, rubs, gallops RESPIRATORY:  Clear to auscultation without rales, wheezing or rhonchi  ABDOMEN: Soft, non-tender, non-distended EXTREMITIES:  No edema; No deformity    Wt Readings from Last 3 Encounters:  08/01/24 56.6 kg  05/31/24 56.2 kg  05/03/24 56.7 kg     EKG today demonstrates  SR, 1st degree AV block, LBBB Vent. rate 62 BPM PR interval 274 ms QRS duration 138 ms QT/QTcB 494/501 ms   Echo 10/06/23  1. Left ventricular ejection fraction, by estimation, is 50 to 55%. The  left ventricle has low normal function. The left ventricle has no regional  wall motion abnormalities. Left ventricular diastolic parameters are  consistent with Grade I diastolic dysfunction (impaired relaxation).   2. Right ventricular systolic function is normal. The right ventricular  size is normal. There is normal pulmonary artery systolic pressure.   3. Left atrial size was mildly dilated.   4. The mitral valve is normal in structure. No evidence of mitral valve  regurgitation. No evidence of mitral stenosis.   5. The aortic valve is normal in structure. Aortic valve regurgitation is  moderate. Aortic valve sclerosis/calcification is present, without any  evidence of aortic stenosis.   6. The inferior vena cava is normal in size with greater than 50%  respiratory variability, suggesting right atrial pressure of 3 mmHg.    CHA2DS2-VASc Score = 5  The patient's score is based upon: CHF History:  1 HTN History: 1 Diabetes History: 0 Stroke History: 0 Vascular Disease History: 1 Age Score: 1 Gender Score: 1       ASSESSMENT AND PLAN: Persistent Atrial Fibrillation/atrial flutter (ICD10:  I48.19) The patient's CHA2DS2-VASc score is 5, indicating a 7.2%  annual risk of stroke.   S/p afib and flutter ablation 05/29/17 with repeat afib and flutter ablation 05/03/24. Patient appears to be maintaining SR. Will discontinue amiodarone  today. Continue Toprol  25 mg daily Continue Xarelto  20 mg daily  Secondary Hypercoagulable State (ICD10:  D68.69) The patient is at significant risk for stroke/thromboembolism based upon her CHA2DS2-VASc Score of 5.  Continue Rivaroxaban  (Xarelto ). No bleeding issues.   HTN Stable on current regimen  CAD CAC score 288 No anginal symptoms Followed by Dr Monetta  HFrecEF EF improved with SR GDMT per primary cardiology team Fluid status appears stable today   Follow up with Dr Inocencio in 6 months.     Daril Kicks PA-C [facility] [address] [place], [place] [address] [phone]

## 2024-08-01 NOTE — Patient Instructions (Signed)
 Stop Amiodarone

## 2024-08-30 NOTE — Progress Notes (Unsigned)
 Cardiology Office Note:    Date:  08/31/2024   ID:  Jennifer Martin, DOB 1956/06/30, MRN [id number]  PCP:  Thurmond Cathlyn LABOR., MD  Cardiologist:  Redell Leiter, MD    Referring MD: Thurmond Cathlyn LABOR., MD    ASSESSMENT:    1. PAF (paroxysmal atrial fibrillation) (HCC)   2. Atypical atrial flutter (HCC)   3. On amiodarone  therapy   4. Chronic anticoagulation   5. Coronary artery disease involving native coronary artery of native heart without angina pectoris   6. Hypertensive heart disease with heart failure (HCC)   7. Mixed hyperlipidemia   8. Statin intolerance   9. Hyponatremia    PLAN:    In order of problems listed above:  Doing very well after repeat ablation offer antiarrhythmic drug no recurrent atrial fibrillation she will continue her anticoagulant uninterrupted and to be fast using the device asked her to check every day with the mobile Kardia and contact me if she has atrial fibrillation or flutter Off amiodarone  I think it is a good choice with her underlying lung disease Continue anticoagulation well-tolerated no bleeding complication Stable CAD no anginal discomfort she is on good bleeding her anticoagulant restarted beta-blocker has a prescription for nitroglycerin  if needed and lipid-lowering therapy nonstatin with Zetia  she had a statin myopathy.  If she required a second agent I would use Repatha. Will recheck her lipids APO B today Also recheck her serum sodium she has had severe hyponatremia while hospitalized in the past   Next appointment: I am in to see her in 6 months   Medication Adjustments/Labs and Tests Ordered: Current medicines are reviewed at length with the patient today.  Concerns regarding medicines are outlined above.  No orders of the defined types were placed in this encounter.  No orders of the defined types were placed in this encounter.    History of Present Illness:    Jennifer Martin is a 68 y.o. female with a hx of complex heart disease  including CAD heart failure with normalization of ejection fraction with guideline directed therapy hypertensive heart disease with heart failure paroxysmal atrial fibrillation flutter with EP ablation maintaining sinus rhythm hyperlipidemia COPD left bundle branch block and myopathy with statin intolerance last seen 08/13/2023.  Most recently had recurrent atrial flutter with cardioversion in the setting of symptomatic hyponatremia treated with amiodarone .  She was seen in the atrial fibrillation clinic 08/01/2024 maintaining sinus rhythm and continued on low-dose amiodarone  and anticoagulation.  Compliance with diet, lifestyle and medications: Yes  She had another ablation off amiodarone  really feels good and is happy life I asked her to get in the habit of checking with the mobile Kardia every day so she is found sound contact us  if she has atrial fibrillation I do not think she needs to go back to the heart failure clinic at this time Her blood pressure initially was elevated I repeated at 120/70 and I will get her in the habit of checking her home blood pressure every day recording and bring into the office with her Past Medical History:  Diagnosis Date   Acute pulmonary edema (HCC)    Adrenal insufficiency (Addison's disease) (HCC) 04/14/2022   Possible. Saw Endocrine.     Anxiety    Aortic regurgitation 06/17/2017   Mild to moderate    Atherosclerosis of coronary artery of native heart with angina pectoris    Atrial flutter (HCC)    CAD (coronary artery disease)    Calculus of  gallbladder without cholecystitis without obstruction 03/22/2020   CHF (congestive heart failure) (HCC)    Chronic anticoagulation 04/13/2017   Chronic combined systolic and diastolic heart failure (HCC) 04/13/2017   varying EF in the past - 45-50% in 09/2016, 37% by echo 08/2016,  61% by nuc 01/2017 then 30-35% this admission (40% by review from Dr. Shlomo)   Chronic obstructive pulmonary disease (HCC) 10/10/2016    COPD (chronic obstructive pulmonary disease) (HCC)    Coronary artery disease involving native coronary artery of native heart with angina pectoris 03/25/2017   Overview:  CTO of LCF Lexiscan MPS 07/28/13 with no ischemia, EF 50%   Coronary atherosclerosis of native coronary artery 06/28/2015   CTO of LCF  Lexiscan MPS 07/28/13 with no ischemia, EF 50%   CTO of LCF  Lexiscan MPS 07/28/13 with no ischemia, EF 50%     Degenerative arthritis of cervical spine with nerve compression 01/28/2022   Demand ischemia (HCC)    Depression    Difficult intubation     small airway and need a little tube    Diverticulosis    Diverticulosis of colon 05/08/2020   Fibrocystic breast changes of both breasts 02/04/2016   Last Assessment & Plan:   She follows with Dr. Bert for this     GERD (gastroesophageal reflux disease)    Gout 05/12/2016   Last Assessment & Plan:  She needs her uric acid level checked    High risk medication use 05/12/2016   History of atrial fibrillation 06/28/2015   Ablation 2018 follows with EP provider on xarelto   Last Assessment & Plan:  This is stable for her Last Assessment & Plan:  She has not had any episodes of this stable   Hypercoagulable state due to persistent atrial fibrillation (HCC) 09/22/2023   Hyperlipidemia    Hypertension    Hypertensive heart disease with heart failure (HCC) 03/25/2017   Inappropriate ADH syndrome    Inguinal hernia 05/12/2016   Inverted nipple 11/10/2022   Right.  Chronic.     LBBB (left bundle branch block)    Left bundle branch block (LBBB) 06/28/2015   Lipoma of skin and subcutaneous tissue of trunk 06/09/2023   Malaise and fatigue 05/12/2016   Last Assessment & Plan:  Update her TSh for her and her CBC, she is advised to stop smoking    Melasma 11/10/2022   Migraine    none since ~ 2012; mild ones then when I did have them because of the beta blockers I was on (06/09/2017)   Mild episode of recurrent major depressive disorder  09/27/2016   Mitral regurgitation 06/17/2017   Mild    Mixed hyperlipidemia 06/28/2015   Last Assessment & Plan:    Reviewed with her the last lipids done through cardiology     Mobitz type 1 second degree AV block    Moderate episode of recurrent major depressive disorder (HCC) 09/27/2016   Myocardial infarction (HCC)    I've had light ones (06/09/2017)   Near syncope    On amiodarone  therapy 04/13/2017   Osteoarthritis    Palpitation 01/13/2018   Paroxysmal atrial fibrillation (HCC) 05/29/2017   Pneumonia    couple times (06/09/2017)   Preoperative cardiovascular examination 10/01/2018   Primary osteoarthritis involving multiple joints 05/12/2016   Last Assessment & Plan:   This is stable for her at this time     SIADH (syndrome of inappropriate ADH production) 05/12/2016   Last Assessment & Plan:    Her sodium  is stable for her     SOB (shortness of breath) on exertion    Statin intolerance 09/22/2019   Statin myopathy 09/27/2019   Tobacco dependence 09/30/2018   V-tach (HCC) 06/28/2015    Current Medications: No outpatient medications have been marked as taking for the 08/31/24 encounter (Appointment) with Monetta Redell PARAS, MD.      EKGs/Labs/Other Studies Reviewed:    The following studies were reviewed today:  Cardiac Studies & Procedures   ______________________________________________________________________________________________   STRESS TESTS  MYOCARDIAL PERFUSION IMAGING 01/26/2017   ECHOCARDIOGRAM  ECHOCARDIOGRAM COMPLETE 10/06/2023  Narrative ECHOCARDIOGRAM REPORT    Patient Name:   ADRIELLA ESSEX Date of Exam: 10/06/2023 Medical Rec #:  [id number]     Height:       60.0 in Accession #:    [id number]    Weight:       122.0 lb Date of Birth:  10-10-1956     BSA:          1.513 m Patient Age:    67 years      BP:           122/85 mmHg Patient Gender: F             HR:           59 bpm. Exam Location:  [place]  Procedure: 2D Echo, Cardiac  Doppler, Color Doppler, Intracardiac Opacification Agent and Strain Analysis  Indications:    Atypical atrial flutter (HCC) [I48.4 (ICD-10-CM)]; Chronic anticoagulation [Z79.01 (ICD-10-CM)]; Hypertensive heart disease with heart failure (HCC) [I11.0 (ICD-10-CM)]; Hyponatremia [E87.1 (ICD-10-CM)]  History:        Patient has prior history of Echocardiogram examinations, most recent 08/07/2023. Previous Myocardial Infarction, COPD, Arrythmias:LBBB and Atrial Flutter, Signs/Symptoms:Hypertensive Heart Disease; Risk Factors:Hypertension and Dyslipidemia.  Sonographer:    Lynwood Silvas RDCS Referring Phys: [id number] Arnitra Sokoloski J Feliberto Stockley  IMPRESSIONS   1. Left ventricular ejection fraction, by estimation, is 50 to 55%. The left ventricle has low normal function. The left ventricle has no regional wall motion abnormalities. Left ventricular diastolic parameters are consistent with Grade I diastolic dysfunction (impaired relaxation). 2. Right ventricular systolic function is normal. The right ventricular size is normal. There is normal pulmonary artery systolic pressure. 3. Left atrial size was mildly dilated. 4. The mitral valve is normal in structure. No evidence of mitral valve regurgitation. No evidence of mitral stenosis. 5. The aortic valve is normal in structure. Aortic valve regurgitation is moderate. Aortic valve sclerosis/calcification is present, without any evidence of aortic stenosis. 6. The inferior vena cava is normal in size with greater than 50% respiratory variability, suggesting right atrial pressure of 3 mmHg.  FINDINGS Left Ventricle: Left ventricular ejection fraction, by estimation, is 50 to 55%. The left ventricle has low normal function. The left ventricle has no regional wall motion abnormalities. Definity  contrast agent was given IV to delineate the left ventricular endocardial borders. The left ventricular internal cavity size was normal in size. There is no left ventricular  hypertrophy. Left ventricular diastolic parameters are consistent with Grade I diastolic dysfunction (impaired relaxation).  Right Ventricle: The right ventricular size is normal. No increase in right ventricular wall thickness. Right ventricular systolic function is normal. There is normal pulmonary artery systolic pressure. The tricuspid regurgitant velocity is 2.44 m/s, and with an assumed right atrial pressure of 3 mmHg, the estimated right ventricular systolic pressure is 26.8 mmHg.  Left Atrium: Left atrial size was mildly dilated.  Right Atrium: Right atrial size  was normal in size.  Pericardium: There is no evidence of pericardial effusion.  Mitral Valve: The mitral valve is normal in structure. No evidence of mitral valve regurgitation. No evidence of mitral valve stenosis.  Tricuspid Valve: The tricuspid valve is normal in structure. Tricuspid valve regurgitation is mild . No evidence of tricuspid stenosis.  Aortic Valve: The aortic valve is normal in structure. Aortic valve regurgitation is moderate. Aortic regurgitation PHT measures 555 msec. Aortic valve sclerosis/calcification is present, without any evidence of aortic stenosis.  Pulmonic Valve: The pulmonic valve was normal in structure. Pulmonic valve regurgitation is mild. No evidence of pulmonic stenosis.  Aorta: The aortic root is normal in size and structure.  Venous: The inferior vena cava is normal in size with greater than 50% respiratory variability, suggesting right atrial pressure of 3 mmHg.  IAS/Shunts: No atrial level shunt detected by color flow Doppler.   LEFT VENTRICLE PLAX 2D LVIDd:         4.60 cm     Diastology LVIDs:         3.60 cm     LV e' medial:    5.98 cm/s LV PW:         1.00 cm     LV E/e' medial:  17.7 LV IVS:        1.20 cm     LV e' lateral:   8.54 cm/s LVOT diam:     2.20 cm     LV E/e' lateral: 12.4 LV SV:         69 LV SV Index:   45 LVOT Area:     3.80 cm  LV Volumes (MOD) LV  vol d, MOD A2C: 54.2 ml LV vol d, MOD A4C: 67.5 ml LV vol s, MOD A2C: 29.3 ml LV vol s, MOD A4C: 44.2 ml LV SV MOD A2C:     24.9 ml LV SV MOD A4C:     67.5 ml LV SV MOD BP:      29.7 ml  RIGHT VENTRICLE          IVC RV Basal diam:  2.90 cm  IVC diam: 1.70 cm  LEFT ATRIUM           Index        RIGHT ATRIUM           Index LA diam:      3.60 cm 2.38 cm/m   RA Area:     12.90 cm LA Vol (A4C): 67.2 ml 44.45 ml/m  RA Volume:   29.90 ml  19.76 ml/m AORTIC VALVE LVOT Vmax:   79.95 cm/s LVOT Vmean:  50.850 cm/s LVOT VTI:    0.181 m AI PHT:      555 msec  AORTA Ao Root diam: 3.10 cm Ao Asc diam:  3.30 cm Ao Desc diam: 2.10 cm  MV E velocity: 106.00 cm/s  TRICUSPID VALVE MV A velocity: 37.20 cm/s   TR Peak grad:   23.8 mmHg MV E/A ratio:  2.85         TR Vmax:        244.00 cm/s  SHUNTS Systemic VTI:  0.18 m Systemic Diam: 2.20 cm  Jennifer Crape MD Electronically signed by Jennifer Crape MD Signature Date/Time: 10/07/2023/11:52:23 AM    Final          ______________________________________________________________________________________________          Recent Labs: 12/15/2023: ALT 9; TSH 3.630 04/15/2024: BUN 8; Creatinine, Ser 0.52; Hemoglobin 13.3; Platelets  338; Potassium 4.8; Sodium 132  Recent Lipid Panel    Component Value Date/Time   CHOL 182 12/15/2023 1120   TRIG 94 12/15/2023 1120   HDL 71 12/15/2023 1120   CHOLHDL 2.6 12/15/2023 1120   CHOLHDL 3.2 06/11/2017 1439   VLDL 20 06/11/2017 1439   LDLCALC 94 12/15/2023 1120    Physical Exam:    VS:  There were no vitals taken for this visit.    Wt Readings from Last 3 Encounters:  08/01/24 124 lb 12.8 oz (56.6 kg)  05/31/24 124 lb (56.2 kg)  05/03/24 125 lb (56.7 kg)     GEN:  Well nourished, well developed in no acute distress HEENT: Normal NECK: No JVD; No carotid bruits LYMPHATICS: No lymphadenopathy CARDIAC: Assess tach that was pretty darn good RRR, no murmurs, rubs,  gallops RESPIRATORY:  Clear to auscultation without rales, wheezing or rhonchi  ABDOMEN: Soft, non-tender, non-distended MUSCULOSKELETAL:  No edema; No deformity  SKIN: Warm and dry NEUROLOGIC:  Alert and oriented x 3 PSYCHIATRIC:  Normal affect    Signed, Redell Leiter, MD  08/31/2024 7:55 AM    [facility]

## 2024-08-31 ENCOUNTER — Ambulatory Visit: Attending: Cardiology | Admitting: Cardiology

## 2024-08-31 ENCOUNTER — Encounter: Payer: Self-pay | Admitting: Cardiology

## 2024-08-31 VITALS — BP 120/70 | HR 62 | Ht 60.0 in | Wt 121.6 lb

## 2024-08-31 DIAGNOSIS — I484 Atypical atrial flutter: Secondary | ICD-10-CM | POA: Diagnosis not present

## 2024-08-31 DIAGNOSIS — Z7901 Long term (current) use of anticoagulants: Secondary | ICD-10-CM | POA: Diagnosis not present

## 2024-08-31 DIAGNOSIS — E782 Mixed hyperlipidemia: Secondary | ICD-10-CM

## 2024-08-31 DIAGNOSIS — Z789 Other specified health status: Secondary | ICD-10-CM

## 2024-08-31 DIAGNOSIS — I480 Paroxysmal atrial fibrillation: Secondary | ICD-10-CM | POA: Diagnosis not present

## 2024-08-31 DIAGNOSIS — Z79899 Other long term (current) drug therapy: Secondary | ICD-10-CM | POA: Diagnosis not present

## 2024-08-31 DIAGNOSIS — I2510 Atherosclerotic heart disease of native coronary artery without angina pectoris: Secondary | ICD-10-CM

## 2024-08-31 DIAGNOSIS — I110 Hypertensive heart disease with heart failure: Secondary | ICD-10-CM

## 2024-08-31 DIAGNOSIS — E871 Hypo-osmolality and hyponatremia: Secondary | ICD-10-CM

## 2024-08-31 NOTE — Patient Instructions (Addendum)
 Medication Instructions:  Your physician recommends that you continue on your current medications as directed. Please refer to the Current Medication list given to you today.  *If you need a refill on your cardiac medications before your next appointment, please call your pharmacy*  Lab Work: Your physician recommends that you return for lab work in:   Labs today: CMP, Lipids, Apo B  If you have labs (blood work) drawn today and your tests are completely normal, you will receive your results only by: MyChart Message (if you have MyChart) OR A paper copy in the mail If you have any lab test that is abnormal or we need to change your treatment, we will call you to review the results.  Testing/Procedures: None  Follow-Up: At [facility], you and your health needs are our priority.  As part of our continuing mission to provide you with exceptional heart care, our providers are all part of one team.  This team includes your primary Cardiologist (physician) and Advanced Practice Providers or APPs (Physician Assistants and Nurse Practitioners) who all work together to provide you with the care you need, when you need it.  Your next appointment:   6 month(s)  Provider:   Redell Leiter, MD    We recommend signing up for the patient portal called MyChart.  Sign up information is provided on this After Visit Summary.  MyChart is used to connect with patients for Virtual Visits (Telemedicine).  Patients are able to view lab/test results, encounter notes, upcoming appointments, etc.  Non-urgent messages can be sent to your provider as well.   To learn more about what you can do with MyChart, go to [email or web address].   Other Instructions Do a daily Kardia and contact Dr. Leiter if A-fib.  Check home blood pressure daily and bring in a list in 2 weeks.  Please keep a BP log for 2 weeks and send by MyChart or mail.                      Dr. Leiter [address] [place], [place]  [address]  Blood Pressure Record Sheet To take your blood pressure, you will need a blood pressure machine. You can buy a blood pressure machine (blood pressure monitor) at your clinic, drug store, or online. When choosing one, consider: An automatic monitor that has an arm cuff. A cuff that wraps snugly around your upper arm. You should be able to fit only one finger between your arm and the cuff. A device that stores blood pressure reading results. Do not choose a monitor that measures your blood pressure from your wrist or finger. Follow your health care provider's instructions for how to take your blood pressure. To use this form: Get one reading in the morning (a.m.) 1-2 hours after you take any medicines. Get one reading in the evening (p.m.) before supper.   Blood pressure log Date: _______________________  a.m. _____________________(1st reading) HR___________            p.m. _____________________(2nd reading) HR__________  Date: _______________________  a.m. _____________________(1st reading) HR___________            p.m. _____________________(2nd reading) HR__________  Date: _______________________  a.m. _____________________(1st reading) HR___________            p.m. _____________________(2nd reading) HR__________  Date: _______________________  a.m. _____________________(1st reading) HR___________            p.m. _____________________(2nd reading) HR__________  Date: _______________________  a.m. _____________________(1st reading) HR___________  p.m. _____________________(2nd reading) HR__________  Date: _______________________  a.m. _____________________(1st reading) HR___________            p.m. _____________________(2nd reading) HR__________  Date: _______________________  a.m. _____________________(1st reading) HR___________            p.m. _____________________(2nd reading) HR__________   This information is not intended to replace advice  given to you by your health care provider. Make sure you discuss any questions you have with your health care provider. Document Revised: 01/25/2020 Document Reviewed: 01/25/2020 Elsevier Patient Education  2021 [employer].

## 2024-09-01 ENCOUNTER — Encounter: Payer: Self-pay | Admitting: Cardiology

## 2024-09-01 LAB — LIPID PANEL
Chol/HDL Ratio: 2.9 ratio (ref 0.0–4.4)
Cholesterol, Total: 206 mg/dL — ABNORMAL HIGH (ref 100–199)
HDL: 70 mg/dL (ref >39)
LDL Chol Calc (NIH): 117 mg/dL — ABNORMAL HIGH (ref 0–99)
Triglycerides: 108 mg/dL (ref 0–149)
VLDL Cholesterol Cal: 19 mg/dL (ref 5–40)

## 2024-09-01 LAB — COMPREHENSIVE METABOLIC PANEL WITH GFR
ALT: 6 IU/L (ref 0–32)
AST: 14 IU/L (ref 0–40)
Albumin: 4.8 g/dL (ref 3.9–4.9)
Alkaline Phosphatase: 79 IU/L (ref 49–135)
BUN/Creatinine Ratio: 16 (ref 12–28)
BUN: 8 mg/dL (ref 8–27)
Bilirubin Total: 0.5 mg/dL (ref 0.0–1.2)
CO2: 24 mmol/L (ref 20–29)
Calcium: 9 mg/dL (ref 8.7–10.3)
Chloride: 94 mmol/L — ABNORMAL LOW (ref 96–106)
Creatinine, Ser: 0.51 mg/dL — ABNORMAL LOW (ref 0.57–1.00)
Globulin, Total: 2 g/dL (ref 1.5–4.5)
Glucose: 84 mg/dL (ref 70–99)
Potassium: 4.2 mmol/L (ref 3.5–5.2)
Sodium: 134 mmol/L (ref 134–144)
Total Protein: 6.8 g/dL (ref 6.0–8.5)
eGFR: 101 mL/min/1.73 (ref >59)

## 2024-09-01 LAB — APOLIPOPROTEIN B: Apolipoprotein B: 96 mg/dL — ABNORMAL HIGH (ref <90)

## 2024-10-26 ENCOUNTER — Other Ambulatory Visit: Payer: Self-pay | Admitting: Cardiology

## 2024-10-26 DIAGNOSIS — I5043 Acute on chronic combined systolic (congestive) and diastolic (congestive) heart failure: Secondary | ICD-10-CM

## 2024-11-21 ENCOUNTER — Telehealth: Payer: Self-pay | Admitting: Cardiology

## 2024-11-25 ENCOUNTER — Other Ambulatory Visit: Payer: Self-pay | Admitting: Cardiology
